# Patient Record
Sex: Male | Born: 1935 | Race: White | Hispanic: No | Marital: Married | Smoking: Never smoker
Health system: Southern US, Community
[De-identification: ages and names within clinical notes are randomized; demographics above are authoritative.]

## PROBLEM LIST (undated history)

## (undated) DIAGNOSIS — I10 Essential (primary) hypertension: Secondary | ICD-10-CM

## (undated) DIAGNOSIS — J309 Allergic rhinitis, unspecified: Secondary | ICD-10-CM

## (undated) DIAGNOSIS — R2689 Other abnormalities of gait and mobility: Secondary | ICD-10-CM

## (undated) DIAGNOSIS — I493 Ventricular premature depolarization: Secondary | ICD-10-CM

## (undated) DIAGNOSIS — E119 Type 2 diabetes mellitus without complications: Secondary | ICD-10-CM

## (undated) DIAGNOSIS — N400 Enlarged prostate without lower urinary tract symptoms: Secondary | ICD-10-CM

## (undated) DIAGNOSIS — R601 Generalized edema: Secondary | ICD-10-CM

## (undated) DIAGNOSIS — E7800 Pure hypercholesterolemia, unspecified: Secondary | ICD-10-CM

## (undated) DIAGNOSIS — H9190 Unspecified hearing loss, unspecified ear: Secondary | ICD-10-CM

## (undated) HISTORY — DX: Ventricular premature depolarization: I49.3

## (undated) HISTORY — DX: Generalized edema: R60.1

## (undated) HISTORY — PX: TONSILLECTOMY: SUR1361

## (undated) HISTORY — DX: Type 2 diabetes mellitus without complications: E11.9

## (undated) HISTORY — DX: Other abnormalities of gait and mobility: R26.89

## (undated) HISTORY — DX: Unspecified hearing loss, unspecified ear: H91.90

## (undated) HISTORY — DX: Essential (primary) hypertension: I10

## (undated) HISTORY — DX: Pure hypercholesterolemia, unspecified: E78.00

## (undated) HISTORY — DX: Allergic rhinitis, unspecified: J30.9

## (undated) HISTORY — DX: Benign prostatic hyperplasia without lower urinary tract symptoms: N40.0

---

## 2003-05-03 ENCOUNTER — Ambulatory Visit (HOSPITAL_COMMUNITY): Admission: RE | Admit: 2003-05-03 | Discharge: 2003-05-03 | Payer: Self-pay | Admitting: Gastroenterology

## 2003-05-03 ENCOUNTER — Encounter (INDEPENDENT_AMBULATORY_CARE_PROVIDER_SITE_OTHER): Payer: Self-pay | Admitting: Specialist

## 2003-08-10 ENCOUNTER — Ambulatory Visit (HOSPITAL_COMMUNITY): Admission: RE | Admit: 2003-08-10 | Discharge: 2003-08-10 | Payer: Self-pay | Admitting: General Surgery

## 2003-08-10 ENCOUNTER — Encounter (INDEPENDENT_AMBULATORY_CARE_PROVIDER_SITE_OTHER): Payer: Self-pay | Admitting: Specialist

## 2007-04-22 HISTORY — PX: CHOLECYSTECTOMY: SHX55

## 2008-03-12 ENCOUNTER — Inpatient Hospital Stay (HOSPITAL_COMMUNITY): Admission: EM | Admit: 2008-03-12 | Discharge: 2008-03-17 | Payer: Self-pay | Admitting: Emergency Medicine

## 2008-03-15 ENCOUNTER — Encounter (INDEPENDENT_AMBULATORY_CARE_PROVIDER_SITE_OTHER): Payer: Self-pay | Admitting: General Surgery

## 2009-10-01 IMAGING — CT CT PELVIS W/ CM
2 of 5 series · 16 of 46 positions shown, 18 images · IV contrast (APPLIED)
Comparison: No prior abdominal CT.  There is correlation with CT of
the chest 03/12/2008

CT ABDOMEN

CLINICAL DATA: Chest and abdominal pain

CT ABDOMEN AND PELVIS WITH CONTRAST
TECHNIQUE: Multidetector CT imaging of the abdomen and pelvis was
performed using the standard protocol following bolus
administration of intravenous contrast.
Contrast: 100 ml [id number]

[Series 2: abd/pelv with 5.0 b31f st · axial · 0.72mm/px · z∈[-486,-76]mm · 13 of 92 slices shown, 15 images]
[im 5/92  soft-tissue]
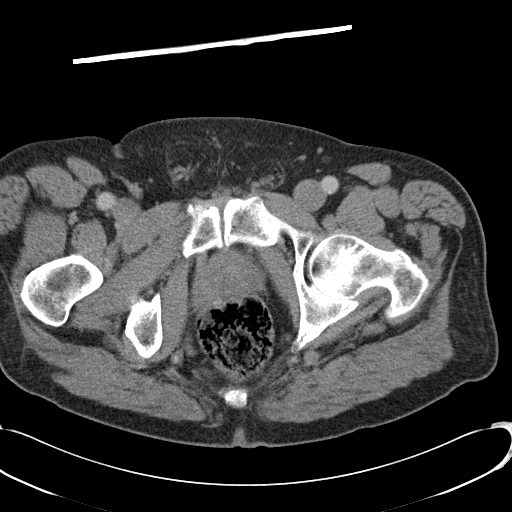
[im 5/92  bone]
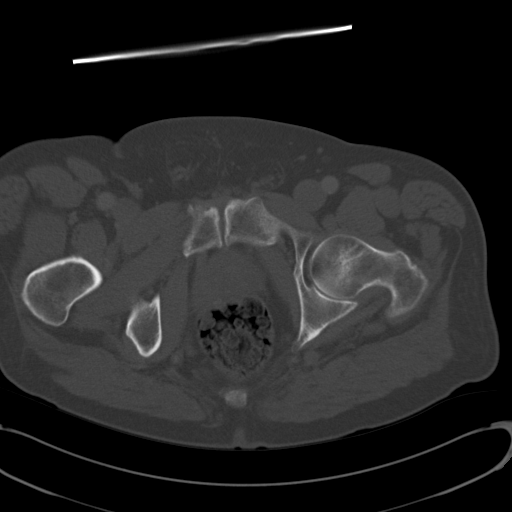
[im 14/92  soft-tissue]
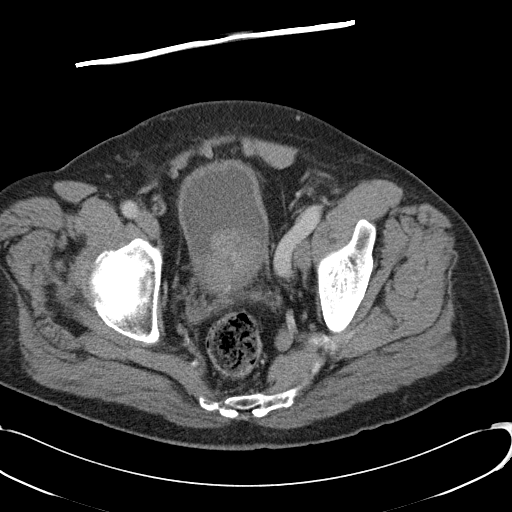
[im 18/92  soft-tissue]
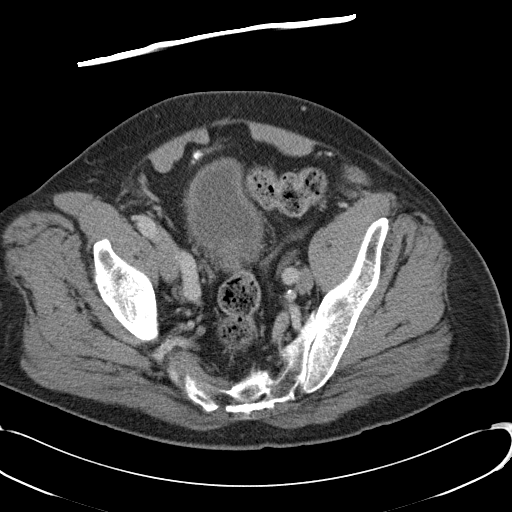
[im 27/92  soft-tissue]
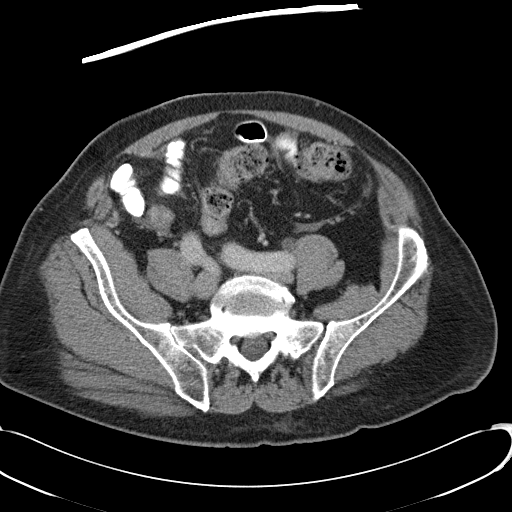
[im 31/92  soft-tissue]
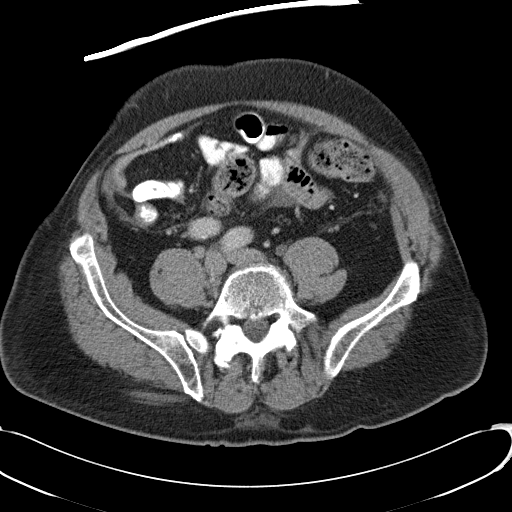
[im 40/92  soft-tissue]
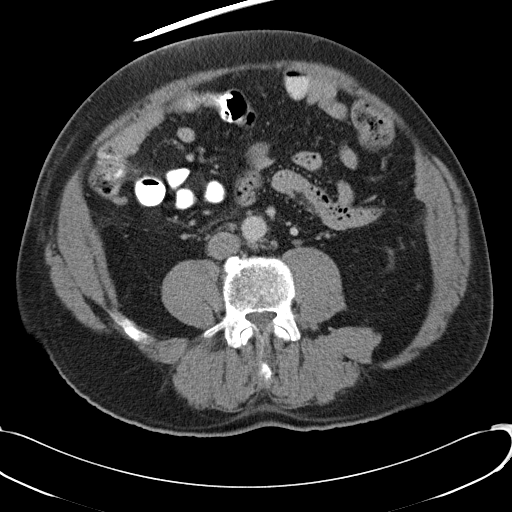
[im 48/92  soft-tissue]
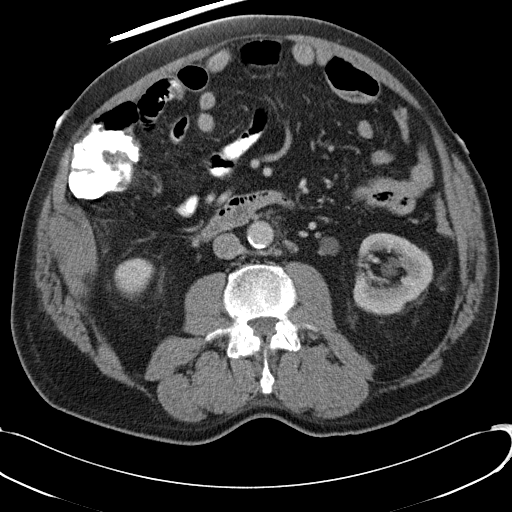
[im 53/92  soft-tissue]
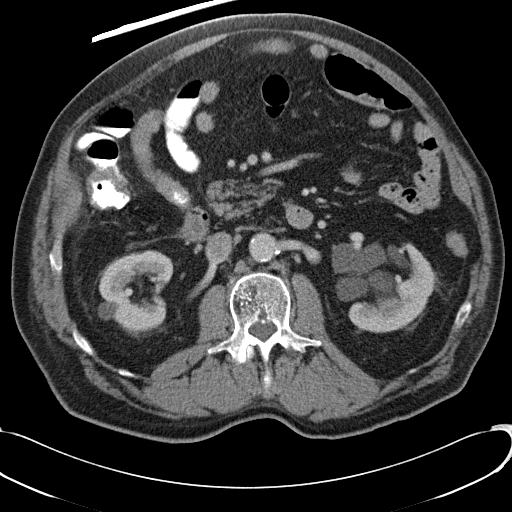
[im 61/92  soft-tissue]
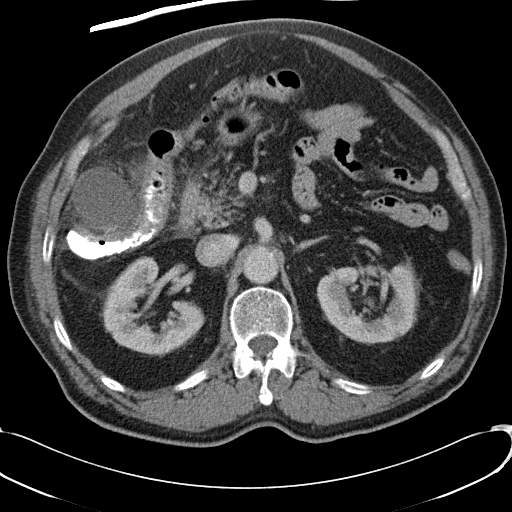
[im 61/92  bone]
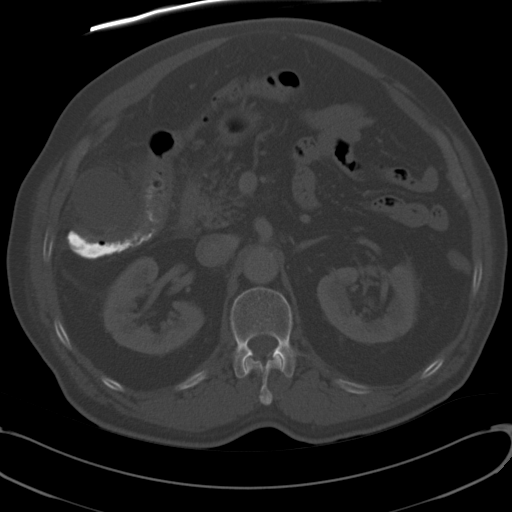
[im 66/92  soft-tissue]
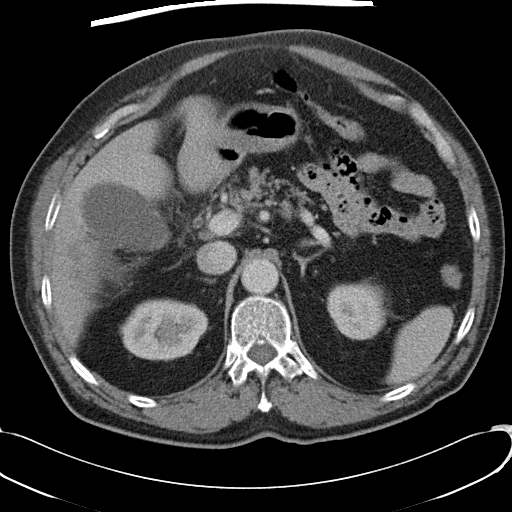
[im 74/92  soft-tissue]
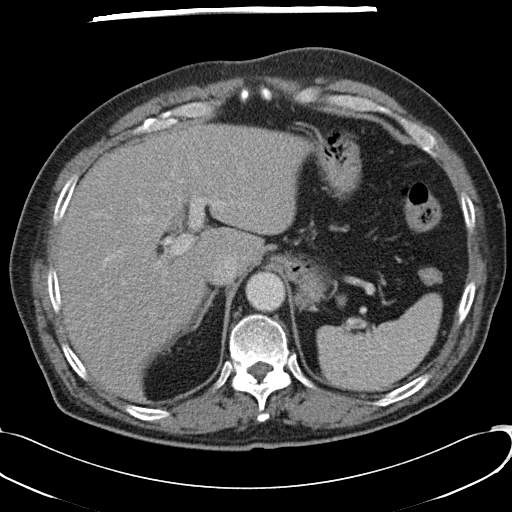
[im 79/92  soft-tissue]
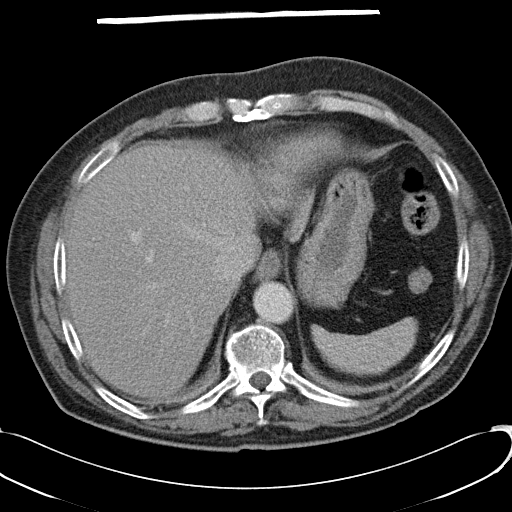
[im 87/92  soft-tissue]
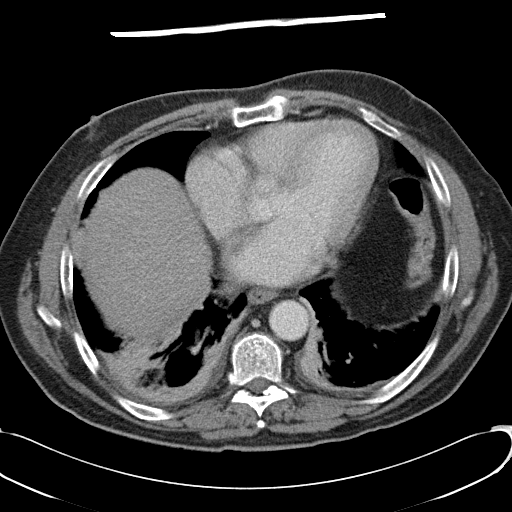

[Series 4: abd/pelv with 2.0 spo cor st · coronal · 0.89mm/px · 3 of 137 slices shown]
[im 46/137  soft-tissue]
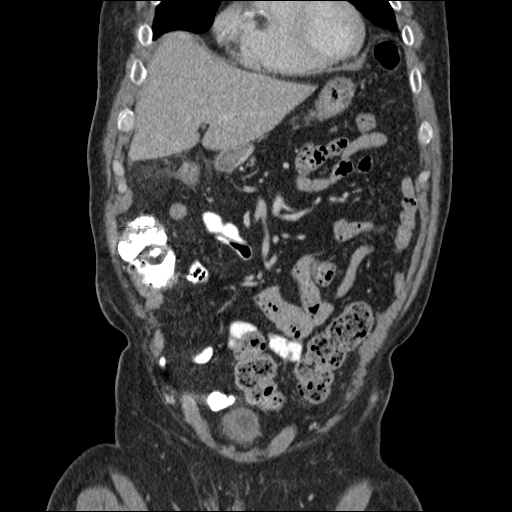
[im 61/137  soft-tissue]
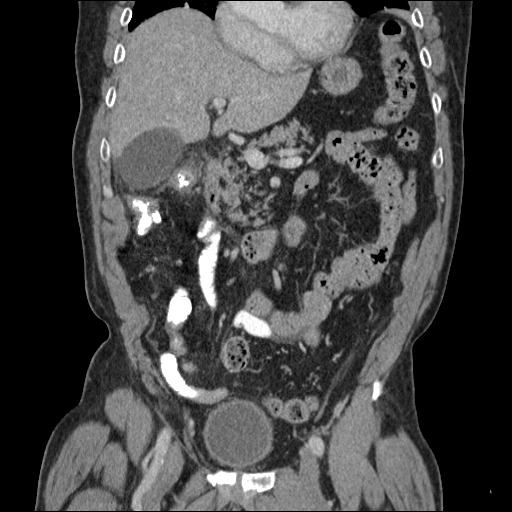
[im 76/137  soft-tissue]
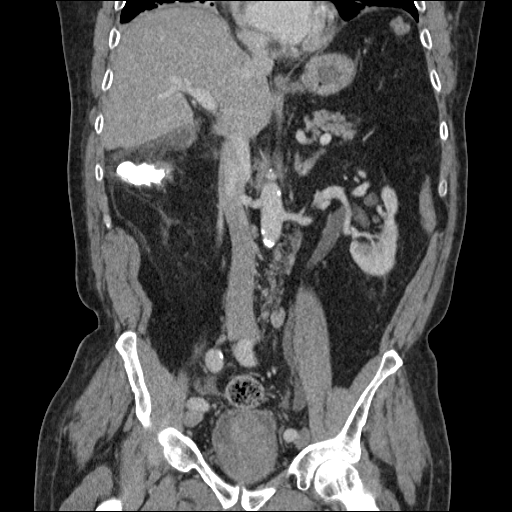

[16 of 46 positions shown; findings below may reference images not displayed]

FINDINGS: Highest cuts include the lung bases.  There is bilateral
lower lobe subsegmental atelectasis or atelectatic pneumonia with
small pleural effusions.  This is a new finding.

There is diffuse low attenuation of the liver without focal
lesions.  This is consistent with steatosis.  The spleen, pancreas,
and adrenal glands within normal limits.  There are small
calcifications in the dependent portion of the gallbladder, which
either represent small gallstones, or gallbladder wall
calcification.  This was noted on yesterday's CT of the chest.
Early and delayed images of the kidneys show multiple cysts and an
extra - renal pelvis on the left.  No obstruction.

The most striking abnormality is that there is an intense
inflammatory process in the fat surrounding the gallbladder and the
right colon, adjacent to the gallbladder.  This was not present on
yesterday's CT of the chest done for PE.  There is no definite
thickening or enhancement of the gallbladder wall to suggest acute
cholecystitis.  However, the gallbladder is mildly distended and
there are either small gallstones or calcifications of the
gallbladder wall.  There may be some thickening of the colon wall
adjacent to the gallbladder, but this could be secondary to
cholecystitis. On yesterday's the study, the right colon did not
appear thickened and so I think the inflammatory process is more
likely related to the gallbladder as opposed to the right colon. A
nuclear HIDA study may be of value to assess patency of the cystic
duct.  These findings were reviewed with Dr. Nana Ato of
gastroenterology.
IMPRESSION: 1.  Bilateral lower lobe subsegmental atelectasis or atelectatic
pneumonia with small effusions - new since CT scan of the chest
done yesterday.
2.  Marked inflammatory changes around the gallbladder and right
colon - new finding since yesterday's CT.  Suspect gallbladder as
the etiology, but cannot rule out colitis.  See above discussion.
There are either small gallstones or gallbladder wall
calcification.
3.  No other acute findings.

CT PELVIS
FINDINGS: There is marked enlargement of the prostate.  No other
focal masses.  No adenopathy or fluid collections.  Bony pelvis
intact.
IMPRESSION: Marked enlargement of the prostate - otherwise unremarkable.

## 2010-09-03 NOTE — Discharge Summary (Signed)
NAME:  Francisco Leon, MAN NO.:  [id number]   MEDICAL RECORD NO.:  [id number]          PATIENT TYPE:  INP   LOCATION:  4708                         FACILITY:  [facility]   PHYSICIAN:  Hollice Espy, M.D.DATE OF BIRTH:  08-12-1931   DATE OF ADMISSION:  03/11/2008  DATE OF DISCHARGE:  03/16/2008                               DISCHARGE SUMMARY   PRIMARY CARE PHYSICIAN:  L. Lupe Carney, MD.   CONSULTANTS DURING THIS CASE:  1. Cherylynn Ridges, MD, [facility]  2. Graylin Shiver, MD, [facility].   DISCHARGE DIAGNOSES:  1. Acute cholecystitis with cholelithiasis.  2. Status post laparoscopic cholecystectomy.  3. Pleural effusion.  4. Diabetes mellitus.  5. Hypertension.  6. Hyperlipidemia.  7. Hypokalemia, now resolved.   DISCHARGE MEDICATIONS:  The patient will resume all his previous  medications.  These are as follows,  1. Lipitor 10.  2. Indapamide 1.25.  3. Lisinopril 40 nightly.  4. Metformin 500 b.i.d.  5. Claritin 10 p.o. daily.  6. Flonase nasal spray nightly.   NEW MEDICATIONS:  Vicodin 5/500 p.o. q.6 h. p.r.n. for pain.   DISCHARGE DIET:  Low-sodium diabetic diet.   ACTIVITY:  Increase activity slowly.   FOLLOWUP APPOINTMENTS:  The patient will follow up with Dr. Lindie Spruce in 2  weeks and Dr. Lupe Carney in 2-3 weeks.  He will call for appointments  for both.   DISPOSITION:  Improved.   HOSPITAL COURSE:  The patient is a 77 year old white male with past  medical history of hypertension, diabetes mellitus, and hyperlipidemia  who presented complaining of right-sided chest as well as abdominal  pain.  Initially, CT scan of the chest was unremarkable for PE.  Cardiac  markers were negative.  It was suspected more this was secondary to an  abdominal process.  A abdominal CT noted gallstones versus gallbladder  wall calcification and marked inflammatory changes.  GI was consulted  who suspect this was acute cholecystitis.   HIDA was ordered and Surgery  was consulted.  The patient's HIDA scan result showed patency of the  gallbladder duct, however, given the fact that when the patient had CT  of the abdomen and pelvis, it showed some bilateral lower lobe  subsegmental atelectasis and small effusion, these were new since his  chest CT and felt that this was reactionary secondary to an inflamed  gallbladder.  The patient was evaluated by Surgery, felt to be medically  stable and was taken to the OR on March 15, 2008, and necrotic  gallbladder was removed and sent for pathology, which is pending.  Postop, the patient was doing well.  His diet was advanced and by  March 16, 2008, he was able to tolerate solid food.  He maintain  __________.  He was able to ambulate well.  Initially, he was reported  to have some decreased O2 sats, but when ambulated in the hall on room  air, it briefly dropped down to 89% on room air, but then __________up  nicely to 94% on room air, ambulating comfortably without any shortness  of breath or pain.  He was noted initially after his Foley was removed  after surgery to have some urinary retention which improved after his  Foley was reinserted.  This was on the early morning hours of March 16, 2008.  The Foley was then again removed on March 16, 2008, at  noon.  The patient has a reported history of some prostate problems, but  he is to yet to follow up with the urologist.  It is suspected likely  that with his initial Foley catheter placement presurgery, he likely had  some bladder spasm.  Currently, he is able to void somewhat, although he  has a slow to start urinary hesitancy.  As long as the patient is able  to avoid and is able to tolerate solid food p.o. by March 16, 2008,  he will be discharged to home with plans on following up with the  urologist which he will do so.  The patient was seen by Surgery and felt  to be medically stable for discharge from their  standpoint.      Hollice Espy, M.D.  Electronically Signed     SKK/MEDQ  D:  03/16/2008  T:  03/17/2008  Job:  [id number]   cc:   Cherylynn Ridges, M.D.  Graylin Shiver, M.D.  Elsworth Soho, M.D.

## 2010-09-03 NOTE — Op Note (Signed)
NAME:  Francisco Leon, DIFIORE NO.:  [id number]   MEDICAL RECORD NO.:  [id number]          PATIENT TYPE:  INP   LOCATION:  4708                         FACILITY:  [facility]   PHYSICIAN:  Cherylynn Ridges, M.D.    DATE OF BIRTH:  04-01-1932   DATE OF PROCEDURE:  03/15/2008  DATE OF DISCHARGE:                               OPERATIVE REPORT   PREOPERATIVE DIAGNOSIS:  Acute right upper quadrant inflammatory process  possible cholecystitis.   POSTOPERATIVE DIAGNOSIS:  Cholelithiasis with acute necrotic gangrenous  acute cholecystitis.   PROCEDURE:  Laparoscopic cholecystectomy with cholangiogram.   SURGEON:  Cherylynn Ridges, MD   ANESTHESIA:  General endotracheal.   ESTIMATED BLOOD LOSS:  Less than 50 mL.   COMPLICATIONS:  None.   CONDITION:  Stable.   FINDINGS:  The patient had a necrotic gallbladder with pigmented stones.  Normal intraoperative cholangiogram.   INDICATIONS FOR OPERATION:  The patient is a 77 year old male who is  admitted with abdominal pain.  CT scan demonstrating a right upper  quadrant inflammatory process, but he had a negative HIDA scan who now  comes in for laparoscopic investigation.   OPERATION:  The patient was taken to the operating room and placed on  the table in supine position.  After an adequate general endotracheal  anesthetic was administered, he was prepped and draped in the usual  sterile manner exposing the midline in the right upper quadrant.   A supraumbilical midline incision was made about 1-1.5 cm long using #15  blade and taken down to the midline fascia.  We grabbed the midline  fascia with Kocher clamps, then incised between the clamps using the 15  blade into the preperitoneal space.  As we tented up on the  supraumbilical fascia, we bluntly dissected down into the peritoneal  space with the Kelly clamp.  Once this was done, a pursestring suture of  0 Vicryl was passed around the fascial opening which secured in a Hasson  cannula which is subsequently passed.  Through the Hasson cannula we  insufflated carbon dioxide gas up to a maximum intra-abdominal pressure  of 15 mmHg.   Once the cannula was in place and the abdomen was distended with gas, we  placed a laparoscope with attached camera and light source, and saw that  there was an inflammatory process over the gallbladder itself.  We were  able to mobilize the omentum off that area to ensure that there was a  necrotic and acutely inflamed gallbladder in place.   When this was noted, two 5-mm right subcostal cannulas were passed and a  subxiphoid 11-12 mm cannula passed under direct vision.  Once all  cannulas were in place, we placed the patient in reverse Trendelenburg  and left side was tilted down and we started the dissection.   We were able to grab the gallbladder and retracted towards the anterior  abdominal wall in the right upper quadrant.  The second grasper was  passed along the infundibulum of the gallbladder long as to slay out the  peritoneum overlying the triangle of Calot and hepatoduodenal triangle.  We dissected the duodenal peritoneum exposing the cystic duct and the  cystic artery.  The cystic artery was clipped proximally x2 and then  distally x1 and transected.  The cystic duct was clipped along the  gallbladder side.  Then a cholecystodochotomy was made using  laparoscopic scissors through which [facility] catheter, which had been passed  through the anterior abdominal wall was passed in order to perform a  cholangiogram.  Cholangiogram showed good flow into the duodenum, no  ductal dilatation, no intraductal filling defects, and good proximal  filling.   Once the cholangiogram was completed, we removed the clips securing the  catheter in place, and then removed the catheter and we distally clipped  the cystic duct x3 and transected the cystic duct.  We then dissected  out the gallbladder from its bed with minimal difficulty.   Although the  bed was necrotic, we were able to remove with minimal bleeding,  cauterizing at the end in order to control hemostasis.   The gallbladder was removed from the supraumbilical fascia by using an  EndoCatch bag.  Once this is done, we were able to close out the  supraumbilical fascia site using a pursestring suture, which was in  place.  We inspected the bed, obtained hemostasis, and irrigated with  about a liter and half of saline solution.  Then aspirated all fluid and  gas from above the liver and removed all cannulas.   A 0.25% Marcaine with epinephrine was injected at all sites.  We then  closed the subxiphoid and supraumbilical skin sites using a running  subcuticular stitch with 4-0 Monocryl.  Dermabond along with Steri-  Strips and Tegaderms were applied to all incisions and covered.  All  counts were correct including needles, sponges, and instruments.      Cherylynn Ridges, M.D.  Electronically Signed     JOW/MEDQ  D:  03/15/2008  T:  03/15/2008  Job:  [id number]   cc:   [facility]

## 2010-09-03 NOTE — Consult Note (Signed)
NAME:  Francisco Leon, Francisco Leon NO.:  [id number]   MEDICAL RECORD NO.:  [id number]          PATIENT TYPE:  INP   LOCATION:  4708                         FACILITY:  [facility]   PHYSICIAN:  Cherylynn Ridges, M.D.    DATE OF BIRTH:  03/19/1932   DATE OF CONSULTATION:  03/13/2008  DATE OF DISCHARGE:                                 CONSULTATION   TIME OF CONSULTATION:  15:30 p.m.   REQUESTING PHYSICIAN:  Graylin Shiver, MD.   PRIMARY CARE PHYSICIAN:  [facility], may be a Dr. Clovis Riley.   CONSULTING SURGEON:  Cherylynn Ridges, M.D.   REASON FOR CONSULTATION:  Questionable cholecystitis.   HISTORY OF PRESENT ILLNESS:  Francisco Leon is a 77 year old white male  with a history of hypertension, diabetes, and hyperlipidemia for which  most of the history I have obtained has been from the chart as most of  my consult was done while the patient was having a HIDA scan done.  Apparently, the patient presented to the Emergency Department on  March 11, 2008, with an acute onset of chest and epigastric pain with  some associated nausea and a small amount of emesis.  Apparently, during  his acute onset of pain, he took a Tums, which made him feel worse.  He  has not had any diarrhea and his last bowel movement was yesterday  morning.   Once at the emergency department, the patient was found to have a white  blood cell count of 13,000; total bilirubin of 1.5, which is now 2.2;  indirect bilirubin is 1.3; and direct bilirubin is 0.2.  Alkaline  phosphatase 44, AST and ALT at 20.  At this time, they ruled out a  myocardial infarction as well as a pulmonary embolus.  Once these were  ruled out, an abdominal ultrasound was obtained, which did not show any  gallstones or biliary ductal diltation.  At this time, the patient was  admitted and on the following day, a CT scan of the abdomen and pelvis  was done, which showed marked inflammatory changes around the  gallbladder as well as the right  colon, in which colitis could not be  ruled out.  Otherwise, small gallstones versus gallbladder wall  calcifications are seen.  Today, the patient's total bilirubin is 2.2  and a white count of 12.9.  To further work this patient up, GI was  consulted and they ordered a HIDA scan, which was normal.  At this time,  we were consulted to see the patient for a possible surgical  intervention.   REVIEW OF SYSTEMS:  See HPI, otherwise all other systems are  unremarkable.   FAMILY HISTORY:  Noncontributory.   PAST MEDICAL HISTORY:  Significant for:  1. Hypertension.  2. Diabetes mellitus.  3. Hyperlipidemia.   PAST SURGICAL HISTORY:  1. Bilateral cataract surgeries in the past 2 months.  2. Hemorrhoidectomy.   SOCIAL HISTORY:  The patient is married.  He does not smoke, drink, or  use any illegal drugs.   ALLERGIES:  To SULFA.   MEDICATIONS:  1. Lisinopril 40 mg a  day.  2. Lipitor 10 mg a day.  3. Indapamide 1.25 mg a day.  4. Metformin 500 mg a day.  5. Claritin nightly.   PHYSICAL EXAMINATION:  GENERAL:  Francisco Leon is a 77 year old white male  who is well developed and well nourished.  Currently, lying in bed and  in no acute distress.  VITAL SIGNS:  Temperature 98.4, pulse 80, respirations 20, and blood  pressure 178/94.  HEENT:  Head is normocephalic and atraumatic.  Sclerae noninjected.  Pupils are equal, round, and reactive to light.  Ears and nose are  without any masses or lesions.  No rhinorrhea.  Mouth is pink and moist.  Throat shows no exudate.  NECK:  Supple.  Trachea is midline.  No thyromegaly.  HEART:  Regular rate and rhythm.  No murmurs, gallops, or rubs are  noted.  Carotid, radial, and pedal pulses 2+ bilaterally.  LUNGS:  Clear to auscultation bilaterally without any wheezes, rhonchi,  or rales.  Respiratory effort is nonlabored.  ABDOMEN:  Soft and obese without any bowel sounds.  He is nondistended  and currently is tender in the right upper quadrant  as well as the right  lower quadrant with some mild guarding in both of these areas.  He does  have a positive Murphy sign.  Otherwise, no obvious masses or hernias  are noted.  MUSCULOSKELETAL:  All four extremities are symmetrical with no cyanosis,  clubbing, or edema.  SKIN:  Warm and dry.  NEUROLOGICAL:  Cranial nerves II through XII appear to be grossly  intact.  PSYCHIATRIC:  The patient is alert and oriented x3 with an appropriate  affect.   LABORATORY DATA AND DIAGNOSTICS:  White blood cell count 12,900,  hemoglobin 12.4, hematocrit 37.1, and platelet count is 211,000.  Sodium  140, potassium 3.3, glucose 155, BUN 10, creatinine 0.73, total  bilirubin 2.2, alkaline phosphatase 44, AST and ALT are 20, and lipase  is normal.  Abdominal ultrasound shows no gallstones and no common bile  duct dilatation.  CT scan shows marked inflammatory changes of the  gallbladder as well as the right colon for which colitis could not be  ruled out, and it appears that some of the gallbladder wall is somewhat  calcified.  HIDA scan is normal.   IMPRESSION:  1. Abdominal pain, questionable gallbladder etiology.  2. Hypertension.  3. Diabetes mellitus.  4. Hyperlipidemia.   PLAN:  At this time, it appears that the patient may be having some  abdominal pain secondary to his gallbladder.  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  However, at this time, we cannot completely rule out that  his pain may be coming from his colon.  Therefore, at this time, we will  place the patient on Zosyn, allow him to have clear liquids tonight and  then make him n.p.o. after midnight.   At this time, we will continue to follow the patient and decide later  whether he may need his gallbladder out or not.  If he does need his  gallbladder out, then his gallbladder is a porcelain gallbladder, it may  be very difficult to remove  laparoscopically and may need an open  cholecystectomy.  However, like I said earlier, we will determine this  at a later date.  Otherwise, we will continue to follow along with you.   Thank you for this consult.  The patient has been seen and examined  by  Dr. Lindie Spruce as well.      Letha Cape, PA      Cherylynn Ridges, M.D.  Electronically Signed    KEO/MEDQ  D:  03/13/2008  T:  03/14/2008  Job:  [id number]   cc:   Graylin Shiver, M.D.  Dr. Janee Morn

## 2010-09-03 NOTE — H&P (Signed)
NAME:  Francisco Leon, Francisco Leon              ACCOUNT NO.:  [id number]   MEDICAL RECORD NO.:  [id number]          PATIENT TYPE:  INP   LOCATION:  1824                         FACILITY:  [facility]   PHYSICIAN:  Corinna L. Lendell Caprice, MDDATE OF BIRTH:  01/18/1932   DATE OF ADMISSION:  03/11/2008  DATE OF DISCHARGE:                              HISTORY & PHYSICAL   CHIEF COMPLAINT:  Chest pain and abdominal pain.   HISTORY OF PRESENT ILLNESS:  Francisco Leon is a pleasant 77 year old white  male with a history of hypertension, diabetes, and hyperlipidemia, who  presents with sudden onset of chest and epigastric pain while at the  computer tonight.  He had some associated nausea.  It felt like  indigestion.  He took some Tums, and he felt worse.  His wife called a  friend who was a nurse who recommended that he come to the emergency  room.  He has received nitroglycerin, GI cocktail, Zofran, aspirin,  Protonix, and Reglan.  He has some relief of his upper pain, but now  complains of diffuse abdominal pain.  He feels a little bloated.  He has  been belching.  He has received no pain medication in the emergency  room.  He has had a single episode of scant emesis without any blood.  He has had no diarrhea.  He has had no shortness of breath or  palpitations.  He ate a chicken kebab with salad earlier this evening at  a [employer].  He has no fevers or chills.  No dysuria.  No cough.   PAST MEDICAL HISTORY:  1. Diabetes.  2. Hypertension.  3. Hyperlipidemia.   MEDICATIONS:  1. Lisinopril 40 mg a day.  2. Lipitor 10 mg a day.  3. Indapamide 1.25 mg a day.  4. Metformin 500 mg a day.  5. Claritin nightly.   ALLERGIES:  SULFA.   SOCIAL HISTORY:  He does not smoke, drink, or use drugs.  He is married.   FAMILY HISTORY:  His mother had died of a hemorrhagic stroke.  He has 2  uncles who died in their 67s of heart attack.   PAST SURGICAL HISTORY:  1. Bilateral cataract surgery within the past 2  months.  2. Hemorrhoidectomy.   REVIEW OF SYSTEMS:  As above, otherwise negative.   PHYSICAL EXAMINATION:  VITAL SIGNS:  Temperature is 97.2, blood pressure  189/78, pulse 74, respiratory rate 16, and oxygen saturation 98% on room  air.  GENERAL:  The patient is well nourished and well developed, in no acute  distress.  His eyes were closed.  HEENT:  Normocephalic and atraumatic.  Pupils are equal, round, and  reactive to light.  Sclera nonicteric.  Moist mucous membranes.  NECK:  Supple.  No carotid bruits.  No lymphadenopathy.  No thyromegaly.  LUNGS:  Clear to auscultation bilaterally without wheezes, rhonchi, or  rales.  CARDIOVASCULAR:  Regular rate and rhythm without murmurs, gallops, or  rubs.  No chest wall tenderness.  GI:  He has somewhat hyperactive bowel sounds, soft, nontender, and  nondistended.  GU AND RECTAL:  Deferred.  EXTREMITIES:  No clubbing, cyanosis, or edema.  NEUROLOGIC:  The patient has his eyes closed, but is oriented and  following commands.  Cranial nerves and sensorimotor exam are intact.  SKIN:  No rash or jaundice.   LABORATORIES:  White blood cell count is 13,000, otherwise normal CBC.  D-dimer 0.58.  Basic metabolic panel significant for potassium of 3.4  and glucose of 188.  Two sets of point-of-care enzymes are negative.  Liver function tests and lipase are pending.  EKG shows normal sinus  rhythm.  One view of the chest shows nothing acute.  CT angiogram of the  chest shows no pulmonary embolus.  Lungs are clear.  Cholelithiasis  without CT evidence of cholecystitis.   ASSESSMENT AND PLAN:  1. Chest pain and abdominal pain:  This is rather uncharacteristic for      acute coronary syndrome, but the patient does have risk factors, I      am more concerned about an intra-abdominal problem.  I will check a      urinalysis, await liver function tests, lipase.  I will also check      a right upper quadrant ultrasound to further evaluate  gallbladder.      He will get antiemetics as needed, Protonix pain medication, and be      admitted to telemetry.  I will check another set of cardiac      enzymes.  Will also continue aspirin 81 mg a day.  2. Hypokalemia.  This will be repleted IV.  3. Hypertension.  Continue outpatient medications.  4. Diabetes.  Hold metformin for now and give sliding scale insulin.  5. Hyperlipidemia.  Hold on his Lipitor for now.  6. Leukocytosis.  He has no pulmonary infection, but I will check a      urinalysis, and await above studies.  I will also repeat this in      the morning.      Corinna L. Lendell Caprice, MD  Electronically Signed     CLS/MEDQ  D:  03/12/2008  T:  03/12/2008  Job:  [id number]   cc:   L. Lupe Carney, M.D.

## 2010-09-03 NOTE — Discharge Summary (Signed)
NAME:  Francisco Leon, Francisco Leon              ACCOUNT NO.:  [id number]   MEDICAL RECORD NO.:  [id number]          PATIENT TYPE:  INP   LOCATION:  4708                         FACILITY:  [facility]   PHYSICIAN:  Hollice Espy, M.D.DATE OF BIRTH:  08/22/1931   DATE OF ADMISSION:  03/12/2008  DATE OF DISCHARGE:  03/17/2008                               DISCHARGE SUMMARY   ADDENDUM   CONSULTANTS IN THIS CASE:  1. Cherylynn Ridges, MD, Surgery.  2. Graylin Shiver, MD, GI.   PRIMARY CARE PHYSICIAN:  L. Lupe Carney, MD    The patient was originally scheduled to be discharged on March 16, 2008, however, he started to have problems that morning with urinary  retention, feeling as if he had to avoid.  His Foley was discontinued  after surgery and initially he was voiding fine, but then looked to be  having problems complaining of bladder fullness and the need to void,  however, he was unable to have more than just a little output.  Bladder  scans revealed greater 70 mL of urine.  Foley was replaced and the  patient passed urine well.  Attempts were made then to discontinue his  Foley again on March 16, 2008, after 12 noon, however, again he then  had similar problems where he was unable to pass more than a small  amount of urine despite the bladder scan showing a large amount of urine  in his bladder.  Foley was replaced and the patient was able to pass  urine comfortably.  He then stayed overnight on March 16, 2008.  I  spoke with Dr. Patsi Sears of [facility] who recommended keeping  the patient's Foley catheter in and discharging him home.  The plan will  be for the patient to follow up with Urology and call the office on  Monday, March 20, 2008, and make an appointment to be seen shortly.  Dr. Patsi Sears also recommend the patient be placed on an antibiotic,  initially who recommended trimethoprim, however, the patient does have  an allergy and we placed on Cipro 500 b.i.d. x3  days in case of a mild  infection which might be causing some of his urinary retention issues,  inflaming his prostate.  The patient is otherwise doing well and he will  be discharged to home.   His additional medication other than the previous medicines from his  other discharge summary will now include Cipro 500 p.o. b.i.d. x3 days.      Hollice Espy, M.D.  Electronically Signed    SKK/MEDQ  D:  03/17/2008  T:  03/17/2008  Job:  [id number]   cc:   Cherylynn Ridges, M.D.  Sigmund I. Patsi Sears, M.D.  Graylin Shiver, M.D.  Elsworth Soho, M.D.

## 2010-09-03 NOTE — Consult Note (Signed)
NAME:  Francisco Leon, Francisco Leon NO.:  [id number]   MEDICAL RECORD NO.:  [id number]          PATIENT TYPE:  INP   LOCATION:  4708                         FACILITY:  [facility]   PHYSICIAN:  Graylin Shiver, M.D.   DATE OF BIRTH:  Mar 19, 1932   DATE OF CONSULTATION:  03/13/2008  DATE OF DISCHARGE:                                 CONSULTATION   REASON FOR CONSULTATION:  The patient is a 77 year old male who came to  the emergency room because of complaints of chest pain and abdominal  pain.  He said that the initial symptom that he had was that he felt  like he had to belch.  He then developed a chest pressure discomfort and  came to the emergency room.  A myocardial infarction and pulmonary  embolus were ruled out.  The patient then had an abdominal ultrasound  done which showed no evidence of gallstones but there was a trace amount  of free fluid in the subhepatic space.  A CT scan done today shows  either small gallstones or gallbladder wall calcification.  Of note is  that when the patient had a CT angio, gallstones were mentioned.  On the  CT today, there is also noted to be a marked inflammatory response  around the gallbladder and affecting the right colon.  The patient has  no symptoms of diarrhea or rectal bleeding.  He gives no symptoms to  suspect colitis.   PAST HISTORY:  1. Diabetes.  2. Hypertension.  3. Hyperlipidemia.   MEDICATIONS PRIOR TO ADMISSION:  Lisinopril, Lipitor, indapamide,  metformin, and Claritin.   ALLERGIES:  SULFA.   SOCIAL HISTORY:  He does not smoke or drink alcohol.   FAMILY HISTORY:  Noncontributory to present problem.   PAST SURGICAL HISTORY:  Hemorrhoidectomy and cataracts.   REVIEW OF SYSTEMS:  Negative except for what was mentioned above.   PHYSICAL EXAMINATION:  VITAL SIGNS:  Stable.  He is afebrile.  GENERAL:  He does not appear in any acute distress.  He is nonicteric.  NECK:  Supple.  HEART:  Regular rhythm.  No murmurs,  gallops or rubs.  LUNGS:  Clear.  ABDOMEN:  Bowel sounds are present although somewhat diminished.  They  do sound normal.  The abdomen is soft and nontender to palpation but  when he does take a deep breath, it hurts in the right upper quadrant of  his abdomen.  There is no obvious hepatosplenomegaly or masses.  EXTREMITIES:  Without edema.   WBCs 12,900, hemoglobin 12.4, hematocrit 37.1.  Liver enzymes show a  total bilirubin of 2.2, but otherwise they are normal.   I went down and reviewed the CT scan and ultrasound today with the  radiologist.   IMPRESSION:  I suspect that this patient has acute cholecystitis.   PLAN:  I have called in a surgical consult and we will order a HIDA  scan.           ______________________________  Graylin Shiver, M.D.     SFG/MEDQ  D:  03/13/2008  T:  03/13/2008  Job:  [id number]  cc:   [facility]  L. Lupe Carney, M.D.

## 2010-09-06 NOTE — Op Note (Signed)
NAME:  Francisco Leon, Francisco Leon                        ACCOUNT NO.:  [id number]   MEDICAL RECORD NO.:  [id number]                   PATIENT TYPE:  AMB   LOCATION:  ENDO                                 [facility]ACILITY:  [facility]   PHYSICIAN:  Petra Kuba, M.D.                 DATE OF BIRTH:  1931/10/09   DATE OF PROCEDURE:  05/03/2003  DATE OF DISCHARGE:                                 OPERATIVE REPORT   PROCEDURE:  Colonoscopy and polypectomy.   INDICATIONS:  Patient with multiple anorectal complaints.  Sounds like  prolapse versus hemorrhoids.  Want to evaluate endoscopically.  The patient  due for colonic screening.  Consent was signed after risks, benefits,  methods, and options were thoroughly discussed in the office.   PREMEDICATIONS:  Demerol 60 mg, Versed 6 mg.   DESCRIPTION OF PROCEDURE:  Rectal inspection pertinent for small external  hemorrhoids.  Digital exam was negative.  Video pediatric adjustable  colonoscope was inserted and on both anorectal pull-through and retroflexion  some internal greater than external hemorrhoids were seen.  There was no  mass, lesion or obvious polyps.  Phot documentation was obtained.  We then  advanced to the cecum which required a rolling him on his back and some  abdominal pressure.  No obvious abnormality was seen on insertion.  The  cecum was identified by the appendiceal orifice and the ileocecal valve.  Prep was fairly adequate.  It did require lots of washing and suctioning for  probably adequate visualization.  On slow withdrawal through the colon, the  cecum and ascending were normal.  In the more proximal transverse, a small  to tiny polyp was seen and snared.  Unfortunately, the snare cut through the  polyp and this area was hot biopsied z1 without obvious bleeding or residual  polyp.  The scope was slowly withdrawn.  Three tiny sigmoid and rectal  polyps were seen and were all hot biopsied and put in a second container.  No other abnormalities  were seen.  We slowly withdrew back to the rectum.  Again thorough evaluation of the rectum with both retroflexion and anorectal  pull-through confirmed the above findings.  The scope was reinserted a short  ways up the left side of the colon.  Air was suctioned and the scope  removed.  The patient tolerated the procedure well.  There were no obvious  immediate complications.   ENDOSCOPIC DIAGNOSES:  1. Internal greater than external hemorrhoids.  2. Two rectal, one sigmoid and one transverse snared and hot biopsied; the     others hot biopsied, tiny to small.  3. Otherwise within normal limits to the cecum.   PLAN:  Await pathology to determine future colonic screening.  We will refer  him to [facility] for consideration of hemorrhoidectomy or any other workup  plan, possibly __________ banding just to see if that helps.  I would be  happy  to see back p.r.n.                                               Petra Kuba, M.D.    MEM/MEDQ  D:  05/03/2003  T:  05/03/2003  Job:  [id number]   cc:   L. Lupe Carney, M.D.  [address] [place]  [place] [address]  Fax: [phone]   Sheppard Plumber. Earlene Plater, M.D.  [address] [place]  [place] [address]  Fax: [phone]

## 2010-09-06 NOTE — Op Note (Signed)
NAME:  Francisco Leon, Francisco Leon                        ACCOUNT NO.:  [id number]   MEDICAL RECORD NO.:  [id number]                   PATIENT TYPE:  AMB   LOCATION:  DAY                                  FACILITY:  [facility]   PHYSICIAN:  Timothy E. Earlene Plater, M.D.              DATE OF BIRTH:  Mar 15, 1932   DATE OF PROCEDURE:  08/10/2003  DATE OF DISCHARGE:                                 OPERATIVE REPORT   PREOPERATIVE DIAGNOSES:  Hemorrhoids with prolapse.   POSTOPERATIVE DIAGNOSES:  Hemorrhoids with prolapse.   PROCEDURE:  PPH hemorrhoidectomy.   SURGEON:  Timothy E. Earlene Plater, M.D.   ANESTHESIA:  General.   Francisco Leon is 26, health for his age, stable hypertension and diabetes with  a long history of hemorrhoids with increasing prolapse.  After careful  discussion in regards to the pathology and the treatment options he wished  to proceed with a PPH hemorrhoidectomy.  He was seen, evaluated, identified  and the permit signed.   The patient was taken to the operating room, placed supine, LMA anesthesia  provided. He was placed in lithotomy and carefully position.  The perianal  area was prepped and draped in the usual fashion.  The only notable external  findings were prolapse of the right posterior internal hemorrhoidal complex.  The anus was injected around and about with Marcaine, epinephrine and Wydase  mixed 1:20 and massaged in well.  The anus was lubricated and gently dilated  and the operating anoscope placed. The entire anorectum was carefully  evaluated and then beginning in the posterior at 4 to 4 1/2 cm above the  dentate line, a pursestring suture of 2-0 Prolene was placed  circumferentially about the rectal mucosa. This was tested with snugging  down on the index finger. Then using the short anoscope for the PPH device,  this was placed, sutures were drawn through, the [facility] stapling device was  then introduced the head of which was placed distal to the pursestring  suture.  The  pursestring suture was then tied snuggly about the shaft of the  PPH.  The tails of the suture were brought out through the shaft of the PPH  stapling device, the device was then closed, held for 50 seconds, fired,  held for 50 seconds and the removed. The specimen was inspected and sent for  routine pathology.  Careful inspection of the wound was then carried out  again using the operating anoscope. Two small areas of bleeding were found  at the suture line, one in the right posterior, one in the left lateral.  Each of these were sutured with 4-0 Vicryl.  Five minutes was allowed to  elapse and then the area was reinspected. There was no bleeding, the suture  line was intact, the lumen was well opened.  The suture line varied from 1-2  cm above the dentate line. This was thought to be satisfactory. A Gelfoam  pack  wrapped with surgical was placed in the rectal canal and an external  dressing.  He had tolerated it well.  Counts were correct. He was extubated  and taken to the recovery room in good condition.   Written and verbal instructions were given to him and his wife including  Percocet 5 mg to use 1/2 to 1, #20.  He will be seen and followed in the  office.                                               Timothy E. Earlene Plater, M.D.    TED/MEDQ  D:  08/10/2003  T:  08/11/2003  Job:  [id number]   cc:   Petra Kuba, M.D.  [address]  [place]  [place] [address]  Fax: [phone]   L. Lupe Carney, M.D.  [address] [place]  [place] [address]  Fax: [phone]

## 2010-09-27 ENCOUNTER — Other Ambulatory Visit: Payer: Self-pay | Admitting: Gastroenterology

## 2011-01-07 ENCOUNTER — Other Ambulatory Visit: Payer: Self-pay | Admitting: Dermatology

## 2011-01-22 LAB — URINALYSIS, ROUTINE W REFLEX MICROSCOPIC
Bilirubin Urine: NEGATIVE
Glucose, UA: >1000 — AB
Protein, ur: NEGATIVE
pH: 6.5

## 2011-01-22 LAB — CBC
HCT: 37.1 — ABNORMAL LOW
HCT: 38.1 — ABNORMAL LOW
HCT: 39.7
Hemoglobin: 12.2 — ABNORMAL LOW
Hemoglobin: 12.4 — ABNORMAL LOW
Hemoglobin: 12.7 — ABNORMAL LOW
MCHC: 32.9
MCHC: 33.3
MCHC: 34.2
MCHC: 34.8
MCV: 85.9
MCV: 87.6
MCV: 87.8
Platelets: 300
RBC: 4.23
RBC: 4.34
RBC: 4.5
RBC: 4.61
RDW: 13.9
RDW: 14
RDW: 14.1
RDW: 14.2
RDW: 14.2
RDW: 14.3
WBC: 12.9 — ABNORMAL HIGH
WBC: 14.8 — ABNORMAL HIGH

## 2011-01-22 LAB — GLUCOSE, CAPILLARY
Glucose-Capillary: 140 — ABNORMAL HIGH
Glucose-Capillary: 143 — ABNORMAL HIGH
Glucose-Capillary: 148 — ABNORMAL HIGH
Glucose-Capillary: 152 — ABNORMAL HIGH
Glucose-Capillary: 153 — ABNORMAL HIGH
Glucose-Capillary: 154 — ABNORMAL HIGH
Glucose-Capillary: 155 — ABNORMAL HIGH
Glucose-Capillary: 176 — ABNORMAL HIGH
Glucose-Capillary: 178 — ABNORMAL HIGH
Glucose-Capillary: 193 — ABNORMAL HIGH
Glucose-Capillary: 197 — ABNORMAL HIGH
Glucose-Capillary: 204 — ABNORMAL HIGH
Glucose-Capillary: 205 — ABNORMAL HIGH

## 2011-01-22 LAB — COMPREHENSIVE METABOLIC PANEL
ALT: 37
ALT: 54 — ABNORMAL HIGH
ALT: 74 — ABNORMAL HIGH
AST: 47 — ABNORMAL HIGH
Albumin: 2.8 — ABNORMAL LOW
Alkaline Phosphatase: 44
Alkaline Phosphatase: 68
BUN: 10
BUN: 8
CO2: 25
CO2: 26
Calcium: 8.8
Calcium: 8.9
Calcium: 9.2
Chloride: 108
Creatinine, Ser: 0.87
Creatinine, Ser: 0.88
GFR calc Af Amer: >60
GFR calc Af Amer: >60
GFR calc non Af Amer: >60
GFR calc non Af Amer: >60
Glucose, Bld: 145 — ABNORMAL HIGH
Glucose, Bld: 149 — ABNORMAL HIGH
Glucose, Bld: 155 — ABNORMAL HIGH
Potassium: 2.9 — ABNORMAL LOW
Potassium: 3.3 — ABNORMAL LOW
Sodium: 136
Sodium: 139
Sodium: 140
Total Bilirubin: 2.2 — ABNORMAL HIGH
Total Protein: 5.5 — ABNORMAL LOW
Total Protein: 5.6 — ABNORMAL LOW
Total Protein: 5.9 — ABNORMAL LOW

## 2011-01-22 LAB — DIFFERENTIAL
Basophils Absolute: 0
Basophils Absolute: 0
Basophils Relative: 0
Basophils Relative: 0
Basophils Relative: 0
Eosinophils Absolute: 0.1
Eosinophils Absolute: 0.1
Eosinophils Absolute: 0.2
Eosinophils Absolute: 0.4
Eosinophils Relative: 0
Eosinophils Relative: 1
Lymphocytes Relative: 17
Lymphs Abs: 2
Lymphs Abs: 2.1
Monocytes Absolute: 1.6 — ABNORMAL HIGH
Monocytes Relative: 11
Monocytes Relative: 11
Monocytes Relative: 6
Neutro Abs: 6.2
Neutro Abs: 8.4 — ABNORMAL HIGH
Neutro Abs: 9.8 — ABNORMAL HIGH
Neutrophils Relative %: 63
Neutrophils Relative %: 65
Neutrophils Relative %: 70
Neutrophils Relative %: 75

## 2011-01-22 LAB — PROTIME-INR
INR: 1
Prothrombin Time: 13.6

## 2011-01-22 LAB — CARDIAC PANEL(CRET KIN+CKTOT+MB+TROPI)
CK, MB: 0.9
CK, MB: 1
CK, MB: 1.6
Total CK: 53
Troponin I: 0.01
Troponin I: 0.02

## 2011-01-22 LAB — POCT CARDIAC MARKERS
CKMB, poc: 1.3
Myoglobin, poc: 52.5
Myoglobin, poc: 63.5
Troponin i, poc: <0.05
Troponin i, poc: <0.05

## 2011-01-22 LAB — D-DIMER, QUANTITATIVE: D-Dimer, Quant: 0.58 — ABNORMAL HIGH

## 2011-01-22 LAB — POCT I-STAT, CHEM 8
BUN: 16
HCT: 40
Potassium: 3.4 — ABNORMAL LOW

## 2011-01-22 LAB — URINE MICROSCOPIC-ADD ON

## 2011-01-22 LAB — APTT: aPTT: 28

## 2011-01-22 LAB — LIPASE, BLOOD: Lipase: 16

## 2011-01-22 LAB — HEPATIC FUNCTION PANEL
Albumin: 3.6
Indirect Bilirubin: 1.3 — ABNORMAL HIGH
Total Protein: 5.8 — ABNORMAL LOW

## 2011-01-22 LAB — MAGNESIUM: Magnesium: 2.4

## 2011-02-18 ENCOUNTER — Other Ambulatory Visit: Payer: Self-pay | Admitting: Dermatology

## 2011-04-23 DIAGNOSIS — E119 Type 2 diabetes mellitus without complications: Secondary | ICD-10-CM | POA: Diagnosis not present

## 2011-04-23 DIAGNOSIS — E7800 Pure hypercholesterolemia, unspecified: Secondary | ICD-10-CM | POA: Diagnosis not present

## 2011-04-29 DIAGNOSIS — I10 Essential (primary) hypertension: Secondary | ICD-10-CM | POA: Diagnosis not present

## 2011-04-29 DIAGNOSIS — R609 Edema, unspecified: Secondary | ICD-10-CM | POA: Diagnosis not present

## 2011-04-29 DIAGNOSIS — E119 Type 2 diabetes mellitus without complications: Secondary | ICD-10-CM | POA: Diagnosis not present

## 2011-04-29 DIAGNOSIS — E7800 Pure hypercholesterolemia, unspecified: Secondary | ICD-10-CM | POA: Diagnosis not present

## 2011-04-29 DIAGNOSIS — J309 Allergic rhinitis, unspecified: Secondary | ICD-10-CM | POA: Diagnosis not present

## 2011-05-15 DIAGNOSIS — H1045 Other chronic allergic conjunctivitis: Secondary | ICD-10-CM | POA: Diagnosis not present

## 2011-05-15 DIAGNOSIS — H01009 Unspecified blepharitis unspecified eye, unspecified eyelid: Secondary | ICD-10-CM | POA: Diagnosis not present

## 2011-05-15 DIAGNOSIS — H4011X0 Primary open-angle glaucoma, stage unspecified: Secondary | ICD-10-CM | POA: Diagnosis not present

## 2011-05-15 DIAGNOSIS — H04129 Dry eye syndrome of unspecified lacrimal gland: Secondary | ICD-10-CM | POA: Diagnosis not present

## 2011-07-28 DIAGNOSIS — Z79899 Other long term (current) drug therapy: Secondary | ICD-10-CM | POA: Diagnosis not present

## 2011-07-29 DIAGNOSIS — H1045 Other chronic allergic conjunctivitis: Secondary | ICD-10-CM | POA: Diagnosis not present

## 2011-07-29 DIAGNOSIS — J309 Allergic rhinitis, unspecified: Secondary | ICD-10-CM | POA: Diagnosis not present

## 2011-08-05 DIAGNOSIS — J309 Allergic rhinitis, unspecified: Secondary | ICD-10-CM | POA: Diagnosis not present

## 2011-08-08 DIAGNOSIS — J309 Allergic rhinitis, unspecified: Secondary | ICD-10-CM | POA: Diagnosis not present

## 2011-08-11 DIAGNOSIS — J309 Allergic rhinitis, unspecified: Secondary | ICD-10-CM | POA: Diagnosis not present

## 2011-08-15 DIAGNOSIS — J309 Allergic rhinitis, unspecified: Secondary | ICD-10-CM | POA: Diagnosis not present

## 2011-08-18 DIAGNOSIS — J309 Allergic rhinitis, unspecified: Secondary | ICD-10-CM | POA: Diagnosis not present

## 2011-08-21 DIAGNOSIS — H1045 Other chronic allergic conjunctivitis: Secondary | ICD-10-CM | POA: Diagnosis not present

## 2011-08-21 DIAGNOSIS — H01009 Unspecified blepharitis unspecified eye, unspecified eyelid: Secondary | ICD-10-CM | POA: Diagnosis not present

## 2011-08-21 DIAGNOSIS — Z961 Presence of intraocular lens: Secondary | ICD-10-CM | POA: Diagnosis not present

## 2011-08-21 DIAGNOSIS — H40019 Open angle with borderline findings, low risk, unspecified eye: Secondary | ICD-10-CM | POA: Diagnosis not present

## 2011-08-21 DIAGNOSIS — D3130 Benign neoplasm of unspecified choroid: Secondary | ICD-10-CM | POA: Diagnosis not present

## 2011-08-22 DIAGNOSIS — J309 Allergic rhinitis, unspecified: Secondary | ICD-10-CM | POA: Diagnosis not present

## 2011-08-25 DIAGNOSIS — J309 Allergic rhinitis, unspecified: Secondary | ICD-10-CM | POA: Diagnosis not present

## 2011-08-29 DIAGNOSIS — J309 Allergic rhinitis, unspecified: Secondary | ICD-10-CM | POA: Diagnosis not present

## 2011-09-02 DIAGNOSIS — J309 Allergic rhinitis, unspecified: Secondary | ICD-10-CM | POA: Diagnosis not present

## 2011-09-04 DIAGNOSIS — J309 Allergic rhinitis, unspecified: Secondary | ICD-10-CM | POA: Diagnosis not present

## 2011-09-08 DIAGNOSIS — J309 Allergic rhinitis, unspecified: Secondary | ICD-10-CM | POA: Diagnosis not present

## 2011-09-12 DIAGNOSIS — J309 Allergic rhinitis, unspecified: Secondary | ICD-10-CM | POA: Diagnosis not present

## 2011-09-16 DIAGNOSIS — J309 Allergic rhinitis, unspecified: Secondary | ICD-10-CM | POA: Diagnosis not present

## 2011-09-19 DIAGNOSIS — J309 Allergic rhinitis, unspecified: Secondary | ICD-10-CM | POA: Diagnosis not present

## 2011-09-22 DIAGNOSIS — J309 Allergic rhinitis, unspecified: Secondary | ICD-10-CM | POA: Diagnosis not present

## 2011-09-26 DIAGNOSIS — J309 Allergic rhinitis, unspecified: Secondary | ICD-10-CM | POA: Diagnosis not present

## 2011-09-29 DIAGNOSIS — J309 Allergic rhinitis, unspecified: Secondary | ICD-10-CM | POA: Diagnosis not present

## 2011-10-03 DIAGNOSIS — J309 Allergic rhinitis, unspecified: Secondary | ICD-10-CM | POA: Diagnosis not present

## 2011-10-06 DIAGNOSIS — J309 Allergic rhinitis, unspecified: Secondary | ICD-10-CM | POA: Diagnosis not present

## 2011-10-09 DIAGNOSIS — J309 Allergic rhinitis, unspecified: Secondary | ICD-10-CM | POA: Diagnosis not present

## 2011-10-13 DIAGNOSIS — J309 Allergic rhinitis, unspecified: Secondary | ICD-10-CM | POA: Diagnosis not present

## 2011-10-20 DIAGNOSIS — J309 Allergic rhinitis, unspecified: Secondary | ICD-10-CM | POA: Diagnosis not present

## 2011-10-28 DIAGNOSIS — E119 Type 2 diabetes mellitus without complications: Secondary | ICD-10-CM | POA: Diagnosis not present

## 2011-10-28 DIAGNOSIS — E7800 Pure hypercholesterolemia, unspecified: Secondary | ICD-10-CM | POA: Diagnosis not present

## 2011-10-28 DIAGNOSIS — R609 Edema, unspecified: Secondary | ICD-10-CM | POA: Diagnosis not present

## 2011-10-28 DIAGNOSIS — I10 Essential (primary) hypertension: Secondary | ICD-10-CM | POA: Diagnosis not present

## 2011-10-29 DIAGNOSIS — J309 Allergic rhinitis, unspecified: Secondary | ICD-10-CM | POA: Diagnosis not present

## 2011-10-30 DIAGNOSIS — H903 Sensorineural hearing loss, bilateral: Secondary | ICD-10-CM | POA: Diagnosis not present

## 2011-10-30 DIAGNOSIS — H6120 Impacted cerumen, unspecified ear: Secondary | ICD-10-CM | POA: Diagnosis not present

## 2011-11-05 DIAGNOSIS — J309 Allergic rhinitis, unspecified: Secondary | ICD-10-CM | POA: Diagnosis not present

## 2011-11-06 DIAGNOSIS — J309 Allergic rhinitis, unspecified: Secondary | ICD-10-CM | POA: Diagnosis not present

## 2011-11-11 DIAGNOSIS — L821 Other seborrheic keratosis: Secondary | ICD-10-CM | POA: Diagnosis not present

## 2011-11-11 DIAGNOSIS — Z85828 Personal history of other malignant neoplasm of skin: Secondary | ICD-10-CM | POA: Diagnosis not present

## 2011-11-11 DIAGNOSIS — L570 Actinic keratosis: Secondary | ICD-10-CM | POA: Diagnosis not present

## 2011-11-14 DIAGNOSIS — J309 Allergic rhinitis, unspecified: Secondary | ICD-10-CM | POA: Diagnosis not present

## 2011-11-24 DIAGNOSIS — J309 Allergic rhinitis, unspecified: Secondary | ICD-10-CM | POA: Diagnosis not present

## 2011-11-25 DIAGNOSIS — N644 Mastodynia: Secondary | ICD-10-CM | POA: Diagnosis not present

## 2011-11-25 DIAGNOSIS — N400 Enlarged prostate without lower urinary tract symptoms: Secondary | ICD-10-CM | POA: Diagnosis not present

## 2011-12-02 DIAGNOSIS — J309 Allergic rhinitis, unspecified: Secondary | ICD-10-CM | POA: Diagnosis not present

## 2011-12-09 DIAGNOSIS — J309 Allergic rhinitis, unspecified: Secondary | ICD-10-CM | POA: Diagnosis not present

## 2011-12-16 DIAGNOSIS — J309 Allergic rhinitis, unspecified: Secondary | ICD-10-CM | POA: Diagnosis not present

## 2011-12-23 DIAGNOSIS — J309 Allergic rhinitis, unspecified: Secondary | ICD-10-CM | POA: Diagnosis not present

## 2011-12-24 DIAGNOSIS — J301 Allergic rhinitis due to pollen: Secondary | ICD-10-CM | POA: Diagnosis not present

## 2011-12-24 DIAGNOSIS — J3089 Other allergic rhinitis: Secondary | ICD-10-CM | POA: Diagnosis not present

## 2011-12-24 DIAGNOSIS — H1045 Other chronic allergic conjunctivitis: Secondary | ICD-10-CM | POA: Diagnosis not present

## 2011-12-25 DIAGNOSIS — J309 Allergic rhinitis, unspecified: Secondary | ICD-10-CM | POA: Diagnosis not present

## 2011-12-30 DIAGNOSIS — J309 Allergic rhinitis, unspecified: Secondary | ICD-10-CM | POA: Diagnosis not present

## 2012-01-01 DIAGNOSIS — J309 Allergic rhinitis, unspecified: Secondary | ICD-10-CM | POA: Diagnosis not present

## 2012-01-06 DIAGNOSIS — J309 Allergic rhinitis, unspecified: Secondary | ICD-10-CM | POA: Diagnosis not present

## 2012-01-13 DIAGNOSIS — J309 Allergic rhinitis, unspecified: Secondary | ICD-10-CM | POA: Diagnosis not present

## 2012-01-20 DIAGNOSIS — J309 Allergic rhinitis, unspecified: Secondary | ICD-10-CM | POA: Diagnosis not present

## 2012-01-27 DIAGNOSIS — J309 Allergic rhinitis, unspecified: Secondary | ICD-10-CM | POA: Diagnosis not present

## 2012-02-02 DIAGNOSIS — J309 Allergic rhinitis, unspecified: Secondary | ICD-10-CM | POA: Diagnosis not present

## 2012-02-10 DIAGNOSIS — J309 Allergic rhinitis, unspecified: Secondary | ICD-10-CM | POA: Diagnosis not present

## 2012-02-17 DIAGNOSIS — J309 Allergic rhinitis, unspecified: Secondary | ICD-10-CM | POA: Diagnosis not present

## 2012-02-24 DIAGNOSIS — Z23 Encounter for immunization: Secondary | ICD-10-CM | POA: Diagnosis not present

## 2012-02-24 DIAGNOSIS — J309 Allergic rhinitis, unspecified: Secondary | ICD-10-CM | POA: Diagnosis not present

## 2012-03-02 DIAGNOSIS — J309 Allergic rhinitis, unspecified: Secondary | ICD-10-CM | POA: Diagnosis not present

## 2012-03-09 DIAGNOSIS — J309 Allergic rhinitis, unspecified: Secondary | ICD-10-CM | POA: Diagnosis not present

## 2012-03-16 DIAGNOSIS — J309 Allergic rhinitis, unspecified: Secondary | ICD-10-CM | POA: Diagnosis not present

## 2012-03-23 DIAGNOSIS — J309 Allergic rhinitis, unspecified: Secondary | ICD-10-CM | POA: Diagnosis not present

## 2012-03-30 DIAGNOSIS — J309 Allergic rhinitis, unspecified: Secondary | ICD-10-CM | POA: Diagnosis not present

## 2012-04-06 DIAGNOSIS — J309 Allergic rhinitis, unspecified: Secondary | ICD-10-CM | POA: Diagnosis not present

## 2012-04-13 DIAGNOSIS — J309 Allergic rhinitis, unspecified: Secondary | ICD-10-CM | POA: Diagnosis not present

## 2012-04-20 DIAGNOSIS — J309 Allergic rhinitis, unspecified: Secondary | ICD-10-CM | POA: Diagnosis not present

## 2012-04-27 DIAGNOSIS — J309 Allergic rhinitis, unspecified: Secondary | ICD-10-CM | POA: Diagnosis not present

## 2012-04-28 DIAGNOSIS — J309 Allergic rhinitis, unspecified: Secondary | ICD-10-CM | POA: Diagnosis not present

## 2012-05-03 DIAGNOSIS — J309 Allergic rhinitis, unspecified: Secondary | ICD-10-CM | POA: Diagnosis not present

## 2012-05-05 DIAGNOSIS — E119 Type 2 diabetes mellitus without complications: Secondary | ICD-10-CM | POA: Diagnosis not present

## 2012-05-05 DIAGNOSIS — N400 Enlarged prostate without lower urinary tract symptoms: Secondary | ICD-10-CM | POA: Diagnosis not present

## 2012-05-05 DIAGNOSIS — E7800 Pure hypercholesterolemia, unspecified: Secondary | ICD-10-CM | POA: Diagnosis not present

## 2012-05-05 DIAGNOSIS — I10 Essential (primary) hypertension: Secondary | ICD-10-CM | POA: Diagnosis not present

## 2012-05-05 DIAGNOSIS — R609 Edema, unspecified: Secondary | ICD-10-CM | POA: Diagnosis not present

## 2012-05-12 DIAGNOSIS — J309 Allergic rhinitis, unspecified: Secondary | ICD-10-CM | POA: Diagnosis not present

## 2012-05-18 DIAGNOSIS — J309 Allergic rhinitis, unspecified: Secondary | ICD-10-CM | POA: Diagnosis not present

## 2012-05-21 DIAGNOSIS — J309 Allergic rhinitis, unspecified: Secondary | ICD-10-CM | POA: Diagnosis not present

## 2012-05-25 DIAGNOSIS — J309 Allergic rhinitis, unspecified: Secondary | ICD-10-CM | POA: Diagnosis not present

## 2012-05-27 DIAGNOSIS — J309 Allergic rhinitis, unspecified: Secondary | ICD-10-CM | POA: Diagnosis not present

## 2012-06-01 DIAGNOSIS — J309 Allergic rhinitis, unspecified: Secondary | ICD-10-CM | POA: Diagnosis not present

## 2012-06-08 DIAGNOSIS — J309 Allergic rhinitis, unspecified: Secondary | ICD-10-CM | POA: Diagnosis not present

## 2012-06-15 DIAGNOSIS — J309 Allergic rhinitis, unspecified: Secondary | ICD-10-CM | POA: Diagnosis not present

## 2012-06-22 DIAGNOSIS — J309 Allergic rhinitis, unspecified: Secondary | ICD-10-CM | POA: Diagnosis not present

## 2012-06-29 DIAGNOSIS — J309 Allergic rhinitis, unspecified: Secondary | ICD-10-CM | POA: Diagnosis not present

## 2012-07-07 DIAGNOSIS — J309 Allergic rhinitis, unspecified: Secondary | ICD-10-CM | POA: Diagnosis not present

## 2012-07-09 DIAGNOSIS — J301 Allergic rhinitis due to pollen: Secondary | ICD-10-CM | POA: Diagnosis not present

## 2012-07-09 DIAGNOSIS — J3089 Other allergic rhinitis: Secondary | ICD-10-CM | POA: Diagnosis not present

## 2012-07-09 DIAGNOSIS — H1045 Other chronic allergic conjunctivitis: Secondary | ICD-10-CM | POA: Diagnosis not present

## 2012-07-13 DIAGNOSIS — J309 Allergic rhinitis, unspecified: Secondary | ICD-10-CM | POA: Diagnosis not present

## 2012-07-20 DIAGNOSIS — J309 Allergic rhinitis, unspecified: Secondary | ICD-10-CM | POA: Diagnosis not present

## 2012-07-27 DIAGNOSIS — J309 Allergic rhinitis, unspecified: Secondary | ICD-10-CM | POA: Diagnosis not present

## 2012-08-03 DIAGNOSIS — J309 Allergic rhinitis, unspecified: Secondary | ICD-10-CM | POA: Diagnosis not present

## 2012-08-10 DIAGNOSIS — J309 Allergic rhinitis, unspecified: Secondary | ICD-10-CM | POA: Diagnosis not present

## 2012-08-17 DIAGNOSIS — J309 Allergic rhinitis, unspecified: Secondary | ICD-10-CM | POA: Diagnosis not present

## 2012-08-24 DIAGNOSIS — J309 Allergic rhinitis, unspecified: Secondary | ICD-10-CM | POA: Diagnosis not present

## 2012-08-31 DIAGNOSIS — J309 Allergic rhinitis, unspecified: Secondary | ICD-10-CM | POA: Diagnosis not present

## 2012-09-06 DIAGNOSIS — J309 Allergic rhinitis, unspecified: Secondary | ICD-10-CM | POA: Diagnosis not present

## 2012-09-07 DIAGNOSIS — J309 Allergic rhinitis, unspecified: Secondary | ICD-10-CM | POA: Diagnosis not present

## 2012-09-15 DIAGNOSIS — J309 Allergic rhinitis, unspecified: Secondary | ICD-10-CM | POA: Diagnosis not present

## 2012-09-21 DIAGNOSIS — J309 Allergic rhinitis, unspecified: Secondary | ICD-10-CM | POA: Diagnosis not present

## 2012-09-28 DIAGNOSIS — J309 Allergic rhinitis, unspecified: Secondary | ICD-10-CM | POA: Diagnosis not present

## 2012-10-05 DIAGNOSIS — J309 Allergic rhinitis, unspecified: Secondary | ICD-10-CM | POA: Diagnosis not present

## 2012-10-12 DIAGNOSIS — J309 Allergic rhinitis, unspecified: Secondary | ICD-10-CM | POA: Diagnosis not present

## 2012-10-14 DIAGNOSIS — J309 Allergic rhinitis, unspecified: Secondary | ICD-10-CM | POA: Diagnosis not present

## 2012-10-19 DIAGNOSIS — J309 Allergic rhinitis, unspecified: Secondary | ICD-10-CM | POA: Diagnosis not present

## 2012-10-21 DIAGNOSIS — H40019 Open angle with borderline findings, low risk, unspecified eye: Secondary | ICD-10-CM | POA: Diagnosis not present

## 2012-10-21 DIAGNOSIS — D3130 Benign neoplasm of unspecified choroid: Secondary | ICD-10-CM | POA: Diagnosis not present

## 2012-10-21 DIAGNOSIS — E119 Type 2 diabetes mellitus without complications: Secondary | ICD-10-CM | POA: Diagnosis not present

## 2012-10-21 DIAGNOSIS — J309 Allergic rhinitis, unspecified: Secondary | ICD-10-CM | POA: Diagnosis not present

## 2012-10-21 DIAGNOSIS — H01009 Unspecified blepharitis unspecified eye, unspecified eyelid: Secondary | ICD-10-CM | POA: Diagnosis not present

## 2012-10-21 DIAGNOSIS — Z961 Presence of intraocular lens: Secondary | ICD-10-CM | POA: Diagnosis not present

## 2012-10-21 DIAGNOSIS — H04129 Dry eye syndrome of unspecified lacrimal gland: Secondary | ICD-10-CM | POA: Diagnosis not present

## 2012-10-26 DIAGNOSIS — J309 Allergic rhinitis, unspecified: Secondary | ICD-10-CM | POA: Diagnosis not present

## 2012-10-28 DIAGNOSIS — J309 Allergic rhinitis, unspecified: Secondary | ICD-10-CM | POA: Diagnosis not present

## 2012-11-02 DIAGNOSIS — I10 Essential (primary) hypertension: Secondary | ICD-10-CM | POA: Diagnosis not present

## 2012-11-02 DIAGNOSIS — R609 Edema, unspecified: Secondary | ICD-10-CM | POA: Diagnosis not present

## 2012-11-02 DIAGNOSIS — N400 Enlarged prostate without lower urinary tract symptoms: Secondary | ICD-10-CM | POA: Diagnosis not present

## 2012-11-02 DIAGNOSIS — E119 Type 2 diabetes mellitus without complications: Secondary | ICD-10-CM | POA: Diagnosis not present

## 2012-11-02 DIAGNOSIS — E7800 Pure hypercholesterolemia, unspecified: Secondary | ICD-10-CM | POA: Diagnosis not present

## 2012-11-02 DIAGNOSIS — J309 Allergic rhinitis, unspecified: Secondary | ICD-10-CM | POA: Diagnosis not present

## 2012-11-09 DIAGNOSIS — D1801 Hemangioma of skin and subcutaneous tissue: Secondary | ICD-10-CM | POA: Diagnosis not present

## 2012-11-09 DIAGNOSIS — L821 Other seborrheic keratosis: Secondary | ICD-10-CM | POA: Diagnosis not present

## 2012-11-09 DIAGNOSIS — L820 Inflamed seborrheic keratosis: Secondary | ICD-10-CM | POA: Diagnosis not present

## 2012-11-09 DIAGNOSIS — J309 Allergic rhinitis, unspecified: Secondary | ICD-10-CM | POA: Diagnosis not present

## 2012-11-09 DIAGNOSIS — L570 Actinic keratosis: Secondary | ICD-10-CM | POA: Diagnosis not present

## 2012-11-09 DIAGNOSIS — Z85828 Personal history of other malignant neoplasm of skin: Secondary | ICD-10-CM | POA: Diagnosis not present

## 2012-11-17 DIAGNOSIS — R633 Feeding difficulties: Secondary | ICD-10-CM | POA: Diagnosis not present

## 2012-11-18 DIAGNOSIS — J309 Allergic rhinitis, unspecified: Secondary | ICD-10-CM | POA: Diagnosis not present

## 2012-12-01 DIAGNOSIS — J309 Allergic rhinitis, unspecified: Secondary | ICD-10-CM | POA: Diagnosis not present

## 2012-12-07 DIAGNOSIS — J309 Allergic rhinitis, unspecified: Secondary | ICD-10-CM | POA: Diagnosis not present

## 2012-12-14 DIAGNOSIS — J309 Allergic rhinitis, unspecified: Secondary | ICD-10-CM | POA: Diagnosis not present

## 2012-12-21 DIAGNOSIS — J309 Allergic rhinitis, unspecified: Secondary | ICD-10-CM | POA: Diagnosis not present

## 2012-12-28 DIAGNOSIS — J309 Allergic rhinitis, unspecified: Secondary | ICD-10-CM | POA: Diagnosis not present

## 2013-01-04 DIAGNOSIS — J309 Allergic rhinitis, unspecified: Secondary | ICD-10-CM | POA: Diagnosis not present

## 2013-01-11 DIAGNOSIS — J309 Allergic rhinitis, unspecified: Secondary | ICD-10-CM | POA: Diagnosis not present

## 2013-01-18 DIAGNOSIS — J309 Allergic rhinitis, unspecified: Secondary | ICD-10-CM | POA: Diagnosis not present

## 2013-01-19 DIAGNOSIS — H1045 Other chronic allergic conjunctivitis: Secondary | ICD-10-CM | POA: Diagnosis not present

## 2013-01-19 DIAGNOSIS — J301 Allergic rhinitis due to pollen: Secondary | ICD-10-CM | POA: Diagnosis not present

## 2013-01-19 DIAGNOSIS — J3089 Other allergic rhinitis: Secondary | ICD-10-CM | POA: Diagnosis not present

## 2013-01-25 DIAGNOSIS — J309 Allergic rhinitis, unspecified: Secondary | ICD-10-CM | POA: Diagnosis not present

## 2013-01-31 DIAGNOSIS — J309 Allergic rhinitis, unspecified: Secondary | ICD-10-CM | POA: Diagnosis not present

## 2013-01-31 DIAGNOSIS — Z23 Encounter for immunization: Secondary | ICD-10-CM | POA: Diagnosis not present

## 2013-02-09 DIAGNOSIS — J309 Allergic rhinitis, unspecified: Secondary | ICD-10-CM | POA: Diagnosis not present

## 2013-02-15 DIAGNOSIS — J309 Allergic rhinitis, unspecified: Secondary | ICD-10-CM | POA: Diagnosis not present

## 2013-02-17 DIAGNOSIS — J309 Allergic rhinitis, unspecified: Secondary | ICD-10-CM | POA: Diagnosis not present

## 2013-02-22 DIAGNOSIS — J309 Allergic rhinitis, unspecified: Secondary | ICD-10-CM | POA: Diagnosis not present

## 2013-03-02 DIAGNOSIS — J309 Allergic rhinitis, unspecified: Secondary | ICD-10-CM | POA: Diagnosis not present

## 2013-03-08 DIAGNOSIS — J309 Allergic rhinitis, unspecified: Secondary | ICD-10-CM | POA: Diagnosis not present

## 2013-03-10 DIAGNOSIS — J309 Allergic rhinitis, unspecified: Secondary | ICD-10-CM | POA: Diagnosis not present

## 2013-03-14 DIAGNOSIS — J309 Allergic rhinitis, unspecified: Secondary | ICD-10-CM | POA: Diagnosis not present

## 2013-03-16 DIAGNOSIS — J309 Allergic rhinitis, unspecified: Secondary | ICD-10-CM | POA: Diagnosis not present

## 2013-03-21 DIAGNOSIS — J309 Allergic rhinitis, unspecified: Secondary | ICD-10-CM | POA: Diagnosis not present

## 2013-03-29 DIAGNOSIS — J309 Allergic rhinitis, unspecified: Secondary | ICD-10-CM | POA: Diagnosis not present

## 2013-04-05 DIAGNOSIS — J309 Allergic rhinitis, unspecified: Secondary | ICD-10-CM | POA: Diagnosis not present

## 2013-04-11 DIAGNOSIS — J309 Allergic rhinitis, unspecified: Secondary | ICD-10-CM | POA: Diagnosis not present

## 2013-04-19 DIAGNOSIS — J309 Allergic rhinitis, unspecified: Secondary | ICD-10-CM | POA: Diagnosis not present

## 2013-04-26 DIAGNOSIS — J309 Allergic rhinitis, unspecified: Secondary | ICD-10-CM | POA: Diagnosis not present

## 2013-05-03 DIAGNOSIS — J309 Allergic rhinitis, unspecified: Secondary | ICD-10-CM | POA: Diagnosis not present

## 2013-05-06 DIAGNOSIS — Z23 Encounter for immunization: Secondary | ICD-10-CM | POA: Diagnosis not present

## 2013-05-06 DIAGNOSIS — E7800 Pure hypercholesterolemia, unspecified: Secondary | ICD-10-CM | POA: Diagnosis not present

## 2013-05-06 DIAGNOSIS — E119 Type 2 diabetes mellitus without complications: Secondary | ICD-10-CM | POA: Diagnosis not present

## 2013-05-06 DIAGNOSIS — R609 Edema, unspecified: Secondary | ICD-10-CM | POA: Diagnosis not present

## 2013-05-06 DIAGNOSIS — K2100 Gastro-esophageal reflux disease with esophagitis, without bleeding: Secondary | ICD-10-CM | POA: Diagnosis not present

## 2013-05-06 DIAGNOSIS — N400 Enlarged prostate without lower urinary tract symptoms: Secondary | ICD-10-CM | POA: Diagnosis not present

## 2013-05-06 DIAGNOSIS — I10 Essential (primary) hypertension: Secondary | ICD-10-CM | POA: Diagnosis not present

## 2013-05-11 DIAGNOSIS — J309 Allergic rhinitis, unspecified: Secondary | ICD-10-CM | POA: Diagnosis not present

## 2013-05-17 DIAGNOSIS — J309 Allergic rhinitis, unspecified: Secondary | ICD-10-CM | POA: Diagnosis not present

## 2013-05-24 DIAGNOSIS — J309 Allergic rhinitis, unspecified: Secondary | ICD-10-CM | POA: Diagnosis not present

## 2013-05-25 DIAGNOSIS — H40019 Open angle with borderline findings, low risk, unspecified eye: Secondary | ICD-10-CM | POA: Diagnosis not present

## 2013-05-31 DIAGNOSIS — J309 Allergic rhinitis, unspecified: Secondary | ICD-10-CM | POA: Diagnosis not present

## 2013-06-06 DIAGNOSIS — J309 Allergic rhinitis, unspecified: Secondary | ICD-10-CM | POA: Diagnosis not present

## 2013-06-15 DIAGNOSIS — J309 Allergic rhinitis, unspecified: Secondary | ICD-10-CM | POA: Diagnosis not present

## 2013-06-21 DIAGNOSIS — J309 Allergic rhinitis, unspecified: Secondary | ICD-10-CM | POA: Diagnosis not present

## 2013-06-28 DIAGNOSIS — J309 Allergic rhinitis, unspecified: Secondary | ICD-10-CM | POA: Diagnosis not present

## 2013-07-06 DIAGNOSIS — J309 Allergic rhinitis, unspecified: Secondary | ICD-10-CM | POA: Diagnosis not present

## 2013-07-12 DIAGNOSIS — J309 Allergic rhinitis, unspecified: Secondary | ICD-10-CM | POA: Diagnosis not present

## 2013-07-13 DIAGNOSIS — J309 Allergic rhinitis, unspecified: Secondary | ICD-10-CM | POA: Diagnosis not present

## 2013-07-20 DIAGNOSIS — J309 Allergic rhinitis, unspecified: Secondary | ICD-10-CM | POA: Diagnosis not present

## 2013-07-26 DIAGNOSIS — J309 Allergic rhinitis, unspecified: Secondary | ICD-10-CM | POA: Diagnosis not present

## 2013-07-28 DIAGNOSIS — J309 Allergic rhinitis, unspecified: Secondary | ICD-10-CM | POA: Diagnosis not present

## 2013-07-29 DIAGNOSIS — H903 Sensorineural hearing loss, bilateral: Secondary | ICD-10-CM | POA: Diagnosis not present

## 2013-07-29 DIAGNOSIS — B3784 Candidal otitis externa: Secondary | ICD-10-CM | POA: Diagnosis not present

## 2013-07-29 DIAGNOSIS — H6120 Impacted cerumen, unspecified ear: Secondary | ICD-10-CM | POA: Diagnosis not present

## 2013-08-02 DIAGNOSIS — J309 Allergic rhinitis, unspecified: Secondary | ICD-10-CM | POA: Diagnosis not present

## 2013-08-05 DIAGNOSIS — J309 Allergic rhinitis, unspecified: Secondary | ICD-10-CM | POA: Diagnosis not present

## 2013-08-09 DIAGNOSIS — J309 Allergic rhinitis, unspecified: Secondary | ICD-10-CM | POA: Diagnosis not present

## 2013-08-16 DIAGNOSIS — J309 Allergic rhinitis, unspecified: Secondary | ICD-10-CM | POA: Diagnosis not present

## 2013-08-23 DIAGNOSIS — J309 Allergic rhinitis, unspecified: Secondary | ICD-10-CM | POA: Diagnosis not present

## 2013-08-30 DIAGNOSIS — J309 Allergic rhinitis, unspecified: Secondary | ICD-10-CM | POA: Diagnosis not present

## 2013-09-06 DIAGNOSIS — J309 Allergic rhinitis, unspecified: Secondary | ICD-10-CM | POA: Diagnosis not present

## 2013-09-14 DIAGNOSIS — J309 Allergic rhinitis, unspecified: Secondary | ICD-10-CM | POA: Diagnosis not present

## 2013-09-22 DIAGNOSIS — J309 Allergic rhinitis, unspecified: Secondary | ICD-10-CM | POA: Diagnosis not present

## 2013-09-27 DIAGNOSIS — J309 Allergic rhinitis, unspecified: Secondary | ICD-10-CM | POA: Diagnosis not present

## 2013-10-04 DIAGNOSIS — J309 Allergic rhinitis, unspecified: Secondary | ICD-10-CM | POA: Diagnosis not present

## 2013-10-11 DIAGNOSIS — J309 Allergic rhinitis, unspecified: Secondary | ICD-10-CM | POA: Diagnosis not present

## 2013-10-18 DIAGNOSIS — J309 Allergic rhinitis, unspecified: Secondary | ICD-10-CM | POA: Diagnosis not present

## 2013-10-25 DIAGNOSIS — J309 Allergic rhinitis, unspecified: Secondary | ICD-10-CM | POA: Diagnosis not present

## 2013-11-01 DIAGNOSIS — J309 Allergic rhinitis, unspecified: Secondary | ICD-10-CM | POA: Diagnosis not present

## 2013-11-04 DIAGNOSIS — E7800 Pure hypercholesterolemia, unspecified: Secondary | ICD-10-CM | POA: Diagnosis not present

## 2013-11-04 DIAGNOSIS — E119 Type 2 diabetes mellitus without complications: Secondary | ICD-10-CM | POA: Diagnosis not present

## 2013-11-04 DIAGNOSIS — R609 Edema, unspecified: Secondary | ICD-10-CM | POA: Diagnosis not present

## 2013-11-04 DIAGNOSIS — N400 Enlarged prostate without lower urinary tract symptoms: Secondary | ICD-10-CM | POA: Diagnosis not present

## 2013-11-04 DIAGNOSIS — Z23 Encounter for immunization: Secondary | ICD-10-CM | POA: Diagnosis not present

## 2013-11-04 DIAGNOSIS — K2100 Gastro-esophageal reflux disease with esophagitis, without bleeding: Secondary | ICD-10-CM | POA: Diagnosis not present

## 2013-11-04 DIAGNOSIS — I10 Essential (primary) hypertension: Secondary | ICD-10-CM | POA: Diagnosis not present

## 2013-11-08 ENCOUNTER — Other Ambulatory Visit: Payer: Self-pay | Admitting: Dermatology

## 2013-11-08 DIAGNOSIS — L821 Other seborrheic keratosis: Secondary | ICD-10-CM | POA: Diagnosis not present

## 2013-11-08 DIAGNOSIS — L570 Actinic keratosis: Secondary | ICD-10-CM | POA: Diagnosis not present

## 2013-11-08 DIAGNOSIS — C44711 Basal cell carcinoma of skin of unspecified lower limb, including hip: Secondary | ICD-10-CM | POA: Diagnosis not present

## 2013-11-08 DIAGNOSIS — L820 Inflamed seborrheic keratosis: Secondary | ICD-10-CM | POA: Diagnosis not present

## 2013-11-08 DIAGNOSIS — D485 Neoplasm of uncertain behavior of skin: Secondary | ICD-10-CM | POA: Diagnosis not present

## 2013-11-08 DIAGNOSIS — D1801 Hemangioma of skin and subcutaneous tissue: Secondary | ICD-10-CM | POA: Diagnosis not present

## 2013-11-08 DIAGNOSIS — Z85828 Personal history of other malignant neoplasm of skin: Secondary | ICD-10-CM | POA: Diagnosis not present

## 2013-11-09 DIAGNOSIS — J309 Allergic rhinitis, unspecified: Secondary | ICD-10-CM | POA: Diagnosis not present

## 2013-11-22 DIAGNOSIS — J309 Allergic rhinitis, unspecified: Secondary | ICD-10-CM | POA: Diagnosis not present

## 2013-11-29 DIAGNOSIS — J309 Allergic rhinitis, unspecified: Secondary | ICD-10-CM | POA: Diagnosis not present

## 2013-12-01 DIAGNOSIS — J309 Allergic rhinitis, unspecified: Secondary | ICD-10-CM | POA: Diagnosis not present

## 2013-12-06 DIAGNOSIS — J309 Allergic rhinitis, unspecified: Secondary | ICD-10-CM | POA: Diagnosis not present

## 2013-12-13 DIAGNOSIS — H40019 Open angle with borderline findings, low risk, unspecified eye: Secondary | ICD-10-CM | POA: Diagnosis not present

## 2013-12-13 DIAGNOSIS — J309 Allergic rhinitis, unspecified: Secondary | ICD-10-CM | POA: Diagnosis not present

## 2013-12-13 DIAGNOSIS — H16149 Punctate keratitis, unspecified eye: Secondary | ICD-10-CM | POA: Diagnosis not present

## 2013-12-13 DIAGNOSIS — E119 Type 2 diabetes mellitus without complications: Secondary | ICD-10-CM | POA: Diagnosis not present

## 2013-12-13 DIAGNOSIS — H18599 Other hereditary corneal dystrophies, unspecified eye: Secondary | ICD-10-CM | POA: Diagnosis not present

## 2013-12-20 DIAGNOSIS — J309 Allergic rhinitis, unspecified: Secondary | ICD-10-CM | POA: Diagnosis not present

## 2013-12-22 DIAGNOSIS — J309 Allergic rhinitis, unspecified: Secondary | ICD-10-CM | POA: Diagnosis not present

## 2013-12-28 DIAGNOSIS — J309 Allergic rhinitis, unspecified: Secondary | ICD-10-CM | POA: Diagnosis not present

## 2013-12-30 DIAGNOSIS — J309 Allergic rhinitis, unspecified: Secondary | ICD-10-CM | POA: Diagnosis not present

## 2014-01-03 DIAGNOSIS — J309 Allergic rhinitis, unspecified: Secondary | ICD-10-CM | POA: Diagnosis not present

## 2014-01-10 DIAGNOSIS — J309 Allergic rhinitis, unspecified: Secondary | ICD-10-CM | POA: Diagnosis not present

## 2014-01-17 DIAGNOSIS — J309 Allergic rhinitis, unspecified: Secondary | ICD-10-CM | POA: Diagnosis not present

## 2014-01-20 DIAGNOSIS — H1045 Other chronic allergic conjunctivitis: Secondary | ICD-10-CM | POA: Diagnosis not present

## 2014-01-20 DIAGNOSIS — J301 Allergic rhinitis due to pollen: Secondary | ICD-10-CM | POA: Diagnosis not present

## 2014-01-20 DIAGNOSIS — J3089 Other allergic rhinitis: Secondary | ICD-10-CM | POA: Diagnosis not present

## 2014-01-24 DIAGNOSIS — J3089 Other allergic rhinitis: Secondary | ICD-10-CM | POA: Diagnosis not present

## 2014-01-24 DIAGNOSIS — J301 Allergic rhinitis due to pollen: Secondary | ICD-10-CM | POA: Diagnosis not present

## 2014-01-24 DIAGNOSIS — Z23 Encounter for immunization: Secondary | ICD-10-CM | POA: Diagnosis not present

## 2014-02-01 DIAGNOSIS — J3089 Other allergic rhinitis: Secondary | ICD-10-CM | POA: Diagnosis not present

## 2014-02-01 DIAGNOSIS — J301 Allergic rhinitis due to pollen: Secondary | ICD-10-CM | POA: Diagnosis not present

## 2014-02-08 DIAGNOSIS — J3089 Other allergic rhinitis: Secondary | ICD-10-CM | POA: Diagnosis not present

## 2014-02-08 DIAGNOSIS — J301 Allergic rhinitis due to pollen: Secondary | ICD-10-CM | POA: Diagnosis not present

## 2014-02-14 DIAGNOSIS — J301 Allergic rhinitis due to pollen: Secondary | ICD-10-CM | POA: Diagnosis not present

## 2014-02-14 DIAGNOSIS — J3089 Other allergic rhinitis: Secondary | ICD-10-CM | POA: Diagnosis not present

## 2014-02-21 DIAGNOSIS — J3089 Other allergic rhinitis: Secondary | ICD-10-CM | POA: Diagnosis not present

## 2014-02-21 DIAGNOSIS — J301 Allergic rhinitis due to pollen: Secondary | ICD-10-CM | POA: Diagnosis not present

## 2014-02-28 DIAGNOSIS — J3089 Other allergic rhinitis: Secondary | ICD-10-CM | POA: Diagnosis not present

## 2014-02-28 DIAGNOSIS — J301 Allergic rhinitis due to pollen: Secondary | ICD-10-CM | POA: Diagnosis not present

## 2014-03-07 DIAGNOSIS — J301 Allergic rhinitis due to pollen: Secondary | ICD-10-CM | POA: Diagnosis not present

## 2014-03-07 DIAGNOSIS — J309 Allergic rhinitis, unspecified: Secondary | ICD-10-CM | POA: Diagnosis not present

## 2014-03-14 DIAGNOSIS — J3089 Other allergic rhinitis: Secondary | ICD-10-CM | POA: Diagnosis not present

## 2014-03-14 DIAGNOSIS — J301 Allergic rhinitis due to pollen: Secondary | ICD-10-CM | POA: Diagnosis not present

## 2014-03-21 DIAGNOSIS — J301 Allergic rhinitis due to pollen: Secondary | ICD-10-CM | POA: Diagnosis not present

## 2014-03-21 DIAGNOSIS — J3089 Other allergic rhinitis: Secondary | ICD-10-CM | POA: Diagnosis not present

## 2014-03-31 DIAGNOSIS — J3089 Other allergic rhinitis: Secondary | ICD-10-CM | POA: Diagnosis not present

## 2014-03-31 DIAGNOSIS — J301 Allergic rhinitis due to pollen: Secondary | ICD-10-CM | POA: Diagnosis not present

## 2014-04-04 DIAGNOSIS — B3784 Candidal otitis externa: Secondary | ICD-10-CM | POA: Diagnosis not present

## 2014-04-04 DIAGNOSIS — J301 Allergic rhinitis due to pollen: Secondary | ICD-10-CM | POA: Diagnosis not present

## 2014-04-04 DIAGNOSIS — J3089 Other allergic rhinitis: Secondary | ICD-10-CM | POA: Diagnosis not present

## 2014-04-04 DIAGNOSIS — J310 Chronic rhinitis: Secondary | ICD-10-CM | POA: Diagnosis not present

## 2014-04-04 DIAGNOSIS — H6122 Impacted cerumen, left ear: Secondary | ICD-10-CM | POA: Diagnosis not present

## 2014-04-04 DIAGNOSIS — H903 Sensorineural hearing loss, bilateral: Secondary | ICD-10-CM | POA: Diagnosis not present

## 2014-04-04 DIAGNOSIS — K219 Gastro-esophageal reflux disease without esophagitis: Secondary | ICD-10-CM | POA: Diagnosis not present

## 2014-04-05 DIAGNOSIS — R05 Cough: Secondary | ICD-10-CM | POA: Diagnosis not present

## 2014-04-11 DIAGNOSIS — J3089 Other allergic rhinitis: Secondary | ICD-10-CM | POA: Diagnosis not present

## 2014-04-11 DIAGNOSIS — J301 Allergic rhinitis due to pollen: Secondary | ICD-10-CM | POA: Diagnosis not present

## 2014-04-18 DIAGNOSIS — J3089 Other allergic rhinitis: Secondary | ICD-10-CM | POA: Diagnosis not present

## 2014-04-18 DIAGNOSIS — J301 Allergic rhinitis due to pollen: Secondary | ICD-10-CM | POA: Diagnosis not present

## 2014-04-25 DIAGNOSIS — J3089 Other allergic rhinitis: Secondary | ICD-10-CM | POA: Diagnosis not present

## 2014-04-25 DIAGNOSIS — J301 Allergic rhinitis due to pollen: Secondary | ICD-10-CM | POA: Diagnosis not present

## 2014-05-02 DIAGNOSIS — J301 Allergic rhinitis due to pollen: Secondary | ICD-10-CM | POA: Diagnosis not present

## 2014-05-02 DIAGNOSIS — J3089 Other allergic rhinitis: Secondary | ICD-10-CM | POA: Diagnosis not present

## 2014-05-03 DIAGNOSIS — J3089 Other allergic rhinitis: Secondary | ICD-10-CM | POA: Diagnosis not present

## 2014-05-03 DIAGNOSIS — J301 Allergic rhinitis due to pollen: Secondary | ICD-10-CM | POA: Diagnosis not present

## 2014-05-09 DIAGNOSIS — J309 Allergic rhinitis, unspecified: Secondary | ICD-10-CM | POA: Diagnosis not present

## 2014-05-09 DIAGNOSIS — J3089 Other allergic rhinitis: Secondary | ICD-10-CM | POA: Diagnosis not present

## 2014-05-09 DIAGNOSIS — E780 Pure hypercholesterolemia: Secondary | ICD-10-CM | POA: Diagnosis not present

## 2014-05-09 DIAGNOSIS — I10 Essential (primary) hypertension: Secondary | ICD-10-CM | POA: Diagnosis not present

## 2014-05-09 DIAGNOSIS — E119 Type 2 diabetes mellitus without complications: Secondary | ICD-10-CM | POA: Diagnosis not present

## 2014-05-09 DIAGNOSIS — J301 Allergic rhinitis due to pollen: Secondary | ICD-10-CM | POA: Diagnosis not present

## 2014-05-09 DIAGNOSIS — K219 Gastro-esophageal reflux disease without esophagitis: Secondary | ICD-10-CM | POA: Diagnosis not present

## 2014-05-09 DIAGNOSIS — N400 Enlarged prostate without lower urinary tract symptoms: Secondary | ICD-10-CM | POA: Diagnosis not present

## 2014-05-09 DIAGNOSIS — R601 Generalized edema: Secondary | ICD-10-CM | POA: Diagnosis not present

## 2014-05-16 DIAGNOSIS — J301 Allergic rhinitis due to pollen: Secondary | ICD-10-CM | POA: Diagnosis not present

## 2014-05-16 DIAGNOSIS — J3089 Other allergic rhinitis: Secondary | ICD-10-CM | POA: Diagnosis not present

## 2014-05-18 DIAGNOSIS — J301 Allergic rhinitis due to pollen: Secondary | ICD-10-CM | POA: Diagnosis not present

## 2014-05-18 DIAGNOSIS — J3089 Other allergic rhinitis: Secondary | ICD-10-CM | POA: Diagnosis not present

## 2014-05-23 DIAGNOSIS — J3089 Other allergic rhinitis: Secondary | ICD-10-CM | POA: Diagnosis not present

## 2014-05-23 DIAGNOSIS — J301 Allergic rhinitis due to pollen: Secondary | ICD-10-CM | POA: Diagnosis not present

## 2014-05-26 DIAGNOSIS — J301 Allergic rhinitis due to pollen: Secondary | ICD-10-CM | POA: Diagnosis not present

## 2014-05-26 DIAGNOSIS — J3089 Other allergic rhinitis: Secondary | ICD-10-CM | POA: Diagnosis not present

## 2014-05-30 DIAGNOSIS — J301 Allergic rhinitis due to pollen: Secondary | ICD-10-CM | POA: Diagnosis not present

## 2014-05-30 DIAGNOSIS — J3089 Other allergic rhinitis: Secondary | ICD-10-CM | POA: Diagnosis not present

## 2014-06-06 DIAGNOSIS — J301 Allergic rhinitis due to pollen: Secondary | ICD-10-CM | POA: Diagnosis not present

## 2014-06-06 DIAGNOSIS — J3089 Other allergic rhinitis: Secondary | ICD-10-CM | POA: Diagnosis not present

## 2014-06-13 DIAGNOSIS — J301 Allergic rhinitis due to pollen: Secondary | ICD-10-CM | POA: Diagnosis not present

## 2014-06-13 DIAGNOSIS — J3089 Other allergic rhinitis: Secondary | ICD-10-CM | POA: Diagnosis not present

## 2014-06-20 DIAGNOSIS — J3089 Other allergic rhinitis: Secondary | ICD-10-CM | POA: Diagnosis not present

## 2014-06-20 DIAGNOSIS — J301 Allergic rhinitis due to pollen: Secondary | ICD-10-CM | POA: Diagnosis not present

## 2014-06-27 DIAGNOSIS — J3089 Other allergic rhinitis: Secondary | ICD-10-CM | POA: Diagnosis not present

## 2014-06-27 DIAGNOSIS — J301 Allergic rhinitis due to pollen: Secondary | ICD-10-CM | POA: Diagnosis not present

## 2014-07-04 DIAGNOSIS — J301 Allergic rhinitis due to pollen: Secondary | ICD-10-CM | POA: Diagnosis not present

## 2014-07-04 DIAGNOSIS — J3089 Other allergic rhinitis: Secondary | ICD-10-CM | POA: Diagnosis not present

## 2014-07-11 DIAGNOSIS — J3081 Allergic rhinitis due to animal (cat) (dog) hair and dander: Secondary | ICD-10-CM | POA: Diagnosis not present

## 2014-07-11 DIAGNOSIS — J3089 Other allergic rhinitis: Secondary | ICD-10-CM | POA: Diagnosis not present

## 2014-07-11 DIAGNOSIS — J301 Allergic rhinitis due to pollen: Secondary | ICD-10-CM | POA: Diagnosis not present

## 2014-07-18 DIAGNOSIS — J301 Allergic rhinitis due to pollen: Secondary | ICD-10-CM | POA: Diagnosis not present

## 2014-07-18 DIAGNOSIS — J3089 Other allergic rhinitis: Secondary | ICD-10-CM | POA: Diagnosis not present

## 2014-07-25 DIAGNOSIS — J301 Allergic rhinitis due to pollen: Secondary | ICD-10-CM | POA: Diagnosis not present

## 2014-07-25 DIAGNOSIS — H6122 Impacted cerumen, left ear: Secondary | ICD-10-CM | POA: Diagnosis not present

## 2014-07-25 DIAGNOSIS — J3089 Other allergic rhinitis: Secondary | ICD-10-CM | POA: Diagnosis not present

## 2014-08-01 DIAGNOSIS — J3089 Other allergic rhinitis: Secondary | ICD-10-CM | POA: Diagnosis not present

## 2014-08-01 DIAGNOSIS — J301 Allergic rhinitis due to pollen: Secondary | ICD-10-CM | POA: Diagnosis not present

## 2014-08-08 DIAGNOSIS — J301 Allergic rhinitis due to pollen: Secondary | ICD-10-CM | POA: Diagnosis not present

## 2014-08-08 DIAGNOSIS — J3089 Other allergic rhinitis: Secondary | ICD-10-CM | POA: Diagnosis not present

## 2014-08-10 DIAGNOSIS — J209 Acute bronchitis, unspecified: Secondary | ICD-10-CM | POA: Diagnosis not present

## 2014-08-15 DIAGNOSIS — J3089 Other allergic rhinitis: Secondary | ICD-10-CM | POA: Diagnosis not present

## 2014-08-15 DIAGNOSIS — J301 Allergic rhinitis due to pollen: Secondary | ICD-10-CM | POA: Diagnosis not present

## 2014-08-22 DIAGNOSIS — J301 Allergic rhinitis due to pollen: Secondary | ICD-10-CM | POA: Diagnosis not present

## 2014-08-22 DIAGNOSIS — J3089 Other allergic rhinitis: Secondary | ICD-10-CM | POA: Diagnosis not present

## 2014-08-29 DIAGNOSIS — J3089 Other allergic rhinitis: Secondary | ICD-10-CM | POA: Diagnosis not present

## 2014-08-29 DIAGNOSIS — J301 Allergic rhinitis due to pollen: Secondary | ICD-10-CM | POA: Diagnosis not present

## 2014-09-05 DIAGNOSIS — J301 Allergic rhinitis due to pollen: Secondary | ICD-10-CM | POA: Diagnosis not present

## 2014-09-05 DIAGNOSIS — J3089 Other allergic rhinitis: Secondary | ICD-10-CM | POA: Diagnosis not present

## 2014-09-12 DIAGNOSIS — J301 Allergic rhinitis due to pollen: Secondary | ICD-10-CM | POA: Diagnosis not present

## 2014-09-12 DIAGNOSIS — J3089 Other allergic rhinitis: Secondary | ICD-10-CM | POA: Diagnosis not present

## 2014-09-19 DIAGNOSIS — J301 Allergic rhinitis due to pollen: Secondary | ICD-10-CM | POA: Diagnosis not present

## 2014-09-19 DIAGNOSIS — J3089 Other allergic rhinitis: Secondary | ICD-10-CM | POA: Diagnosis not present

## 2014-09-26 DIAGNOSIS — J3089 Other allergic rhinitis: Secondary | ICD-10-CM | POA: Diagnosis not present

## 2014-09-26 DIAGNOSIS — J301 Allergic rhinitis due to pollen: Secondary | ICD-10-CM | POA: Diagnosis not present

## 2014-10-03 DIAGNOSIS — J301 Allergic rhinitis due to pollen: Secondary | ICD-10-CM | POA: Diagnosis not present

## 2014-10-03 DIAGNOSIS — J3089 Other allergic rhinitis: Secondary | ICD-10-CM | POA: Diagnosis not present

## 2014-10-05 DIAGNOSIS — J301 Allergic rhinitis due to pollen: Secondary | ICD-10-CM | POA: Diagnosis not present

## 2014-10-05 DIAGNOSIS — J3089 Other allergic rhinitis: Secondary | ICD-10-CM | POA: Diagnosis not present

## 2014-10-09 DIAGNOSIS — J3089 Other allergic rhinitis: Secondary | ICD-10-CM | POA: Diagnosis not present

## 2014-10-09 DIAGNOSIS — J301 Allergic rhinitis due to pollen: Secondary | ICD-10-CM | POA: Diagnosis not present

## 2014-10-11 DIAGNOSIS — J3089 Other allergic rhinitis: Secondary | ICD-10-CM | POA: Diagnosis not present

## 2014-10-11 DIAGNOSIS — J301 Allergic rhinitis due to pollen: Secondary | ICD-10-CM | POA: Diagnosis not present

## 2014-10-17 DIAGNOSIS — J301 Allergic rhinitis due to pollen: Secondary | ICD-10-CM | POA: Diagnosis not present

## 2014-10-17 DIAGNOSIS — J3089 Other allergic rhinitis: Secondary | ICD-10-CM | POA: Diagnosis not present

## 2014-10-25 DIAGNOSIS — J3089 Other allergic rhinitis: Secondary | ICD-10-CM | POA: Diagnosis not present

## 2014-10-25 DIAGNOSIS — J301 Allergic rhinitis due to pollen: Secondary | ICD-10-CM | POA: Diagnosis not present

## 2014-10-31 DIAGNOSIS — N400 Enlarged prostate without lower urinary tract symptoms: Secondary | ICD-10-CM | POA: Diagnosis not present

## 2014-10-31 DIAGNOSIS — E780 Pure hypercholesterolemia: Secondary | ICD-10-CM | POA: Diagnosis not present

## 2014-10-31 DIAGNOSIS — E119 Type 2 diabetes mellitus without complications: Secondary | ICD-10-CM | POA: Diagnosis not present

## 2014-10-31 DIAGNOSIS — J309 Allergic rhinitis, unspecified: Secondary | ICD-10-CM | POA: Diagnosis not present

## 2014-10-31 DIAGNOSIS — I10 Essential (primary) hypertension: Secondary | ICD-10-CM | POA: Diagnosis not present

## 2014-11-01 DIAGNOSIS — J301 Allergic rhinitis due to pollen: Secondary | ICD-10-CM | POA: Diagnosis not present

## 2014-11-01 DIAGNOSIS — J3089 Other allergic rhinitis: Secondary | ICD-10-CM | POA: Diagnosis not present

## 2014-11-07 DIAGNOSIS — J301 Allergic rhinitis due to pollen: Secondary | ICD-10-CM | POA: Diagnosis not present

## 2014-11-07 DIAGNOSIS — J3089 Other allergic rhinitis: Secondary | ICD-10-CM | POA: Diagnosis not present

## 2014-11-09 DIAGNOSIS — L821 Other seborrheic keratosis: Secondary | ICD-10-CM | POA: Diagnosis not present

## 2014-11-09 DIAGNOSIS — L570 Actinic keratosis: Secondary | ICD-10-CM | POA: Diagnosis not present

## 2014-11-09 DIAGNOSIS — D1801 Hemangioma of skin and subcutaneous tissue: Secondary | ICD-10-CM | POA: Diagnosis not present

## 2014-11-09 DIAGNOSIS — L820 Inflamed seborrheic keratosis: Secondary | ICD-10-CM | POA: Diagnosis not present

## 2014-11-09 DIAGNOSIS — L565 Disseminated superficial actinic porokeratosis (DSAP): Secondary | ICD-10-CM | POA: Diagnosis not present

## 2014-11-09 DIAGNOSIS — Z85828 Personal history of other malignant neoplasm of skin: Secondary | ICD-10-CM | POA: Diagnosis not present

## 2014-11-14 DIAGNOSIS — J3089 Other allergic rhinitis: Secondary | ICD-10-CM | POA: Diagnosis not present

## 2014-11-14 DIAGNOSIS — J301 Allergic rhinitis due to pollen: Secondary | ICD-10-CM | POA: Diagnosis not present

## 2014-11-21 DIAGNOSIS — J301 Allergic rhinitis due to pollen: Secondary | ICD-10-CM | POA: Diagnosis not present

## 2014-11-21 DIAGNOSIS — J3089 Other allergic rhinitis: Secondary | ICD-10-CM | POA: Diagnosis not present

## 2014-11-28 DIAGNOSIS — J3089 Other allergic rhinitis: Secondary | ICD-10-CM | POA: Diagnosis not present

## 2014-11-28 DIAGNOSIS — J301 Allergic rhinitis due to pollen: Secondary | ICD-10-CM | POA: Diagnosis not present

## 2014-12-05 DIAGNOSIS — J301 Allergic rhinitis due to pollen: Secondary | ICD-10-CM | POA: Diagnosis not present

## 2014-12-05 DIAGNOSIS — J3089 Other allergic rhinitis: Secondary | ICD-10-CM | POA: Diagnosis not present

## 2014-12-12 DIAGNOSIS — J3089 Other allergic rhinitis: Secondary | ICD-10-CM | POA: Diagnosis not present

## 2014-12-12 DIAGNOSIS — J301 Allergic rhinitis due to pollen: Secondary | ICD-10-CM | POA: Diagnosis not present

## 2014-12-14 DIAGNOSIS — Z0100 Encounter for examination of eyes and vision without abnormal findings: Secondary | ICD-10-CM | POA: Diagnosis not present

## 2014-12-14 DIAGNOSIS — H35373 Puckering of macula, bilateral: Secondary | ICD-10-CM | POA: Diagnosis not present

## 2014-12-14 DIAGNOSIS — Z961 Presence of intraocular lens: Secondary | ICD-10-CM | POA: Diagnosis not present

## 2014-12-19 DIAGNOSIS — J301 Allergic rhinitis due to pollen: Secondary | ICD-10-CM | POA: Diagnosis not present

## 2014-12-19 DIAGNOSIS — J3089 Other allergic rhinitis: Secondary | ICD-10-CM | POA: Diagnosis not present

## 2014-12-27 DIAGNOSIS — J301 Allergic rhinitis due to pollen: Secondary | ICD-10-CM | POA: Diagnosis not present

## 2014-12-27 DIAGNOSIS — J3089 Other allergic rhinitis: Secondary | ICD-10-CM | POA: Diagnosis not present

## 2015-01-02 DIAGNOSIS — J301 Allergic rhinitis due to pollen: Secondary | ICD-10-CM | POA: Diagnosis not present

## 2015-01-02 DIAGNOSIS — J3089 Other allergic rhinitis: Secondary | ICD-10-CM | POA: Diagnosis not present

## 2015-01-09 DIAGNOSIS — J301 Allergic rhinitis due to pollen: Secondary | ICD-10-CM | POA: Diagnosis not present

## 2015-01-09 DIAGNOSIS — J3089 Other allergic rhinitis: Secondary | ICD-10-CM | POA: Diagnosis not present

## 2015-01-16 DIAGNOSIS — J301 Allergic rhinitis due to pollen: Secondary | ICD-10-CM | POA: Diagnosis not present

## 2015-01-16 DIAGNOSIS — J3089 Other allergic rhinitis: Secondary | ICD-10-CM | POA: Diagnosis not present

## 2015-01-17 DIAGNOSIS — H6123 Impacted cerumen, bilateral: Secondary | ICD-10-CM | POA: Diagnosis not present

## 2015-01-23 DIAGNOSIS — J301 Allergic rhinitis due to pollen: Secondary | ICD-10-CM | POA: Diagnosis not present

## 2015-01-23 DIAGNOSIS — J3089 Other allergic rhinitis: Secondary | ICD-10-CM | POA: Diagnosis not present

## 2015-01-26 DIAGNOSIS — J3089 Other allergic rhinitis: Secondary | ICD-10-CM | POA: Diagnosis not present

## 2015-01-26 DIAGNOSIS — J301 Allergic rhinitis due to pollen: Secondary | ICD-10-CM | POA: Diagnosis not present

## 2015-01-31 DIAGNOSIS — J3089 Other allergic rhinitis: Secondary | ICD-10-CM | POA: Diagnosis not present

## 2015-01-31 DIAGNOSIS — J301 Allergic rhinitis due to pollen: Secondary | ICD-10-CM | POA: Diagnosis not present

## 2015-02-06 DIAGNOSIS — J301 Allergic rhinitis due to pollen: Secondary | ICD-10-CM | POA: Diagnosis not present

## 2015-02-06 DIAGNOSIS — J3089 Other allergic rhinitis: Secondary | ICD-10-CM | POA: Diagnosis not present

## 2015-02-13 DIAGNOSIS — J301 Allergic rhinitis due to pollen: Secondary | ICD-10-CM | POA: Diagnosis not present

## 2015-02-13 DIAGNOSIS — J3089 Other allergic rhinitis: Secondary | ICD-10-CM | POA: Diagnosis not present

## 2015-02-14 DIAGNOSIS — Z23 Encounter for immunization: Secondary | ICD-10-CM | POA: Diagnosis not present

## 2015-02-19 DIAGNOSIS — J3089 Other allergic rhinitis: Secondary | ICD-10-CM | POA: Diagnosis not present

## 2015-02-19 DIAGNOSIS — J301 Allergic rhinitis due to pollen: Secondary | ICD-10-CM | POA: Diagnosis not present

## 2015-02-20 DIAGNOSIS — J301 Allergic rhinitis due to pollen: Secondary | ICD-10-CM | POA: Diagnosis not present

## 2015-02-20 DIAGNOSIS — J3089 Other allergic rhinitis: Secondary | ICD-10-CM | POA: Diagnosis not present

## 2015-02-27 DIAGNOSIS — B3784 Candidal otitis externa: Secondary | ICD-10-CM | POA: Diagnosis not present

## 2015-02-27 DIAGNOSIS — J3089 Other allergic rhinitis: Secondary | ICD-10-CM | POA: Diagnosis not present

## 2015-02-27 DIAGNOSIS — H6241 Otitis externa in other diseases classified elsewhere, right ear: Secondary | ICD-10-CM | POA: Diagnosis not present

## 2015-02-27 DIAGNOSIS — J301 Allergic rhinitis due to pollen: Secondary | ICD-10-CM | POA: Diagnosis not present

## 2015-02-27 DIAGNOSIS — H6121 Impacted cerumen, right ear: Secondary | ICD-10-CM | POA: Diagnosis not present

## 2015-03-01 DIAGNOSIS — J3089 Other allergic rhinitis: Secondary | ICD-10-CM | POA: Diagnosis not present

## 2015-03-01 DIAGNOSIS — J301 Allergic rhinitis due to pollen: Secondary | ICD-10-CM | POA: Diagnosis not present

## 2015-03-06 DIAGNOSIS — J301 Allergic rhinitis due to pollen: Secondary | ICD-10-CM | POA: Diagnosis not present

## 2015-03-06 DIAGNOSIS — J3089 Other allergic rhinitis: Secondary | ICD-10-CM | POA: Diagnosis not present

## 2015-03-08 DIAGNOSIS — J301 Allergic rhinitis due to pollen: Secondary | ICD-10-CM | POA: Diagnosis not present

## 2015-03-08 DIAGNOSIS — J3089 Other allergic rhinitis: Secondary | ICD-10-CM | POA: Diagnosis not present

## 2015-03-13 DIAGNOSIS — J3089 Other allergic rhinitis: Secondary | ICD-10-CM | POA: Diagnosis not present

## 2015-03-13 DIAGNOSIS — J301 Allergic rhinitis due to pollen: Secondary | ICD-10-CM | POA: Diagnosis not present

## 2015-03-20 DIAGNOSIS — J3089 Other allergic rhinitis: Secondary | ICD-10-CM | POA: Diagnosis not present

## 2015-03-20 DIAGNOSIS — J301 Allergic rhinitis due to pollen: Secondary | ICD-10-CM | POA: Diagnosis not present

## 2015-03-27 DIAGNOSIS — J301 Allergic rhinitis due to pollen: Secondary | ICD-10-CM | POA: Diagnosis not present

## 2015-03-27 DIAGNOSIS — J3089 Other allergic rhinitis: Secondary | ICD-10-CM | POA: Diagnosis not present

## 2015-04-03 DIAGNOSIS — J3089 Other allergic rhinitis: Secondary | ICD-10-CM | POA: Diagnosis not present

## 2015-04-03 DIAGNOSIS — J301 Allergic rhinitis due to pollen: Secondary | ICD-10-CM | POA: Diagnosis not present

## 2015-04-03 DIAGNOSIS — H6122 Impacted cerumen, left ear: Secondary | ICD-10-CM | POA: Diagnosis not present

## 2015-04-03 DIAGNOSIS — H903 Sensorineural hearing loss, bilateral: Secondary | ICD-10-CM | POA: Diagnosis not present

## 2015-04-03 DIAGNOSIS — Z974 Presence of external hearing-aid: Secondary | ICD-10-CM | POA: Diagnosis not present

## 2015-04-10 DIAGNOSIS — J301 Allergic rhinitis due to pollen: Secondary | ICD-10-CM | POA: Diagnosis not present

## 2015-04-10 DIAGNOSIS — J3089 Other allergic rhinitis: Secondary | ICD-10-CM | POA: Diagnosis not present

## 2015-04-17 DIAGNOSIS — J301 Allergic rhinitis due to pollen: Secondary | ICD-10-CM | POA: Diagnosis not present

## 2015-04-17 DIAGNOSIS — J3089 Other allergic rhinitis: Secondary | ICD-10-CM | POA: Diagnosis not present

## 2015-04-25 DIAGNOSIS — J3089 Other allergic rhinitis: Secondary | ICD-10-CM | POA: Diagnosis not present

## 2015-04-25 DIAGNOSIS — J301 Allergic rhinitis due to pollen: Secondary | ICD-10-CM | POA: Diagnosis not present

## 2015-05-03 DIAGNOSIS — J301 Allergic rhinitis due to pollen: Secondary | ICD-10-CM | POA: Diagnosis not present

## 2015-05-03 DIAGNOSIS — J3089 Other allergic rhinitis: Secondary | ICD-10-CM | POA: Diagnosis not present

## 2015-05-07 DIAGNOSIS — J301 Allergic rhinitis due to pollen: Secondary | ICD-10-CM | POA: Diagnosis not present

## 2015-05-07 DIAGNOSIS — J3089 Other allergic rhinitis: Secondary | ICD-10-CM | POA: Diagnosis not present

## 2015-05-09 DIAGNOSIS — E7800 Pure hypercholesterolemia, unspecified: Secondary | ICD-10-CM | POA: Diagnosis not present

## 2015-05-09 DIAGNOSIS — E119 Type 2 diabetes mellitus without complications: Secondary | ICD-10-CM | POA: Diagnosis not present

## 2015-05-09 DIAGNOSIS — N400 Enlarged prostate without lower urinary tract symptoms: Secondary | ICD-10-CM | POA: Diagnosis not present

## 2015-05-09 DIAGNOSIS — Z7984 Long term (current) use of oral hypoglycemic drugs: Secondary | ICD-10-CM | POA: Diagnosis not present

## 2015-05-09 DIAGNOSIS — J309 Allergic rhinitis, unspecified: Secondary | ICD-10-CM | POA: Diagnosis not present

## 2015-05-09 DIAGNOSIS — I10 Essential (primary) hypertension: Secondary | ICD-10-CM | POA: Diagnosis not present

## 2015-05-14 DIAGNOSIS — J3089 Other allergic rhinitis: Secondary | ICD-10-CM | POA: Diagnosis not present

## 2015-05-14 DIAGNOSIS — J301 Allergic rhinitis due to pollen: Secondary | ICD-10-CM | POA: Diagnosis not present

## 2015-05-17 DIAGNOSIS — Z86010 Personal history of colonic polyps: Secondary | ICD-10-CM | POA: Diagnosis not present

## 2015-05-17 DIAGNOSIS — K5900 Constipation, unspecified: Secondary | ICD-10-CM | POA: Diagnosis not present

## 2015-05-22 DIAGNOSIS — J3089 Other allergic rhinitis: Secondary | ICD-10-CM | POA: Diagnosis not present

## 2015-05-22 DIAGNOSIS — J301 Allergic rhinitis due to pollen: Secondary | ICD-10-CM | POA: Diagnosis not present

## 2015-05-28 DIAGNOSIS — J301 Allergic rhinitis due to pollen: Secondary | ICD-10-CM | POA: Diagnosis not present

## 2015-05-28 DIAGNOSIS — J3089 Other allergic rhinitis: Secondary | ICD-10-CM | POA: Diagnosis not present

## 2015-06-05 DIAGNOSIS — J301 Allergic rhinitis due to pollen: Secondary | ICD-10-CM | POA: Diagnosis not present

## 2015-06-05 DIAGNOSIS — J3089 Other allergic rhinitis: Secondary | ICD-10-CM | POA: Diagnosis not present

## 2015-06-13 DIAGNOSIS — J301 Allergic rhinitis due to pollen: Secondary | ICD-10-CM | POA: Diagnosis not present

## 2015-06-13 DIAGNOSIS — J3089 Other allergic rhinitis: Secondary | ICD-10-CM | POA: Diagnosis not present

## 2015-06-19 DIAGNOSIS — J3089 Other allergic rhinitis: Secondary | ICD-10-CM | POA: Diagnosis not present

## 2015-06-19 DIAGNOSIS — J301 Allergic rhinitis due to pollen: Secondary | ICD-10-CM | POA: Diagnosis not present

## 2015-06-25 DIAGNOSIS — J3089 Other allergic rhinitis: Secondary | ICD-10-CM | POA: Diagnosis not present

## 2015-06-25 DIAGNOSIS — J301 Allergic rhinitis due to pollen: Secondary | ICD-10-CM | POA: Diagnosis not present

## 2015-06-26 DIAGNOSIS — J301 Allergic rhinitis due to pollen: Secondary | ICD-10-CM | POA: Diagnosis not present

## 2015-06-26 DIAGNOSIS — J3089 Other allergic rhinitis: Secondary | ICD-10-CM | POA: Diagnosis not present

## 2015-06-27 DIAGNOSIS — K5900 Constipation, unspecified: Secondary | ICD-10-CM | POA: Diagnosis not present

## 2015-06-27 DIAGNOSIS — Z86010 Personal history of colonic polyps: Secondary | ICD-10-CM | POA: Diagnosis not present

## 2015-07-03 DIAGNOSIS — J3089 Other allergic rhinitis: Secondary | ICD-10-CM | POA: Diagnosis not present

## 2015-07-03 DIAGNOSIS — J301 Allergic rhinitis due to pollen: Secondary | ICD-10-CM | POA: Diagnosis not present

## 2015-07-04 DIAGNOSIS — Z85828 Personal history of other malignant neoplasm of skin: Secondary | ICD-10-CM | POA: Diagnosis not present

## 2015-07-04 DIAGNOSIS — L0889 Other specified local infections of the skin and subcutaneous tissue: Secondary | ICD-10-CM | POA: Diagnosis not present

## 2015-07-04 DIAGNOSIS — L309 Dermatitis, unspecified: Secondary | ICD-10-CM | POA: Diagnosis not present

## 2015-07-04 DIAGNOSIS — L304 Erythema intertrigo: Secondary | ICD-10-CM | POA: Diagnosis not present

## 2015-07-04 DIAGNOSIS — L0100 Impetigo, unspecified: Secondary | ICD-10-CM | POA: Diagnosis not present

## 2015-07-10 DIAGNOSIS — J301 Allergic rhinitis due to pollen: Secondary | ICD-10-CM | POA: Diagnosis not present

## 2015-07-10 DIAGNOSIS — J3089 Other allergic rhinitis: Secondary | ICD-10-CM | POA: Diagnosis not present

## 2015-07-17 DIAGNOSIS — J3089 Other allergic rhinitis: Secondary | ICD-10-CM | POA: Diagnosis not present

## 2015-07-17 DIAGNOSIS — J301 Allergic rhinitis due to pollen: Secondary | ICD-10-CM | POA: Diagnosis not present

## 2015-07-24 DIAGNOSIS — J3089 Other allergic rhinitis: Secondary | ICD-10-CM | POA: Diagnosis not present

## 2015-07-24 DIAGNOSIS — J301 Allergic rhinitis due to pollen: Secondary | ICD-10-CM | POA: Diagnosis not present

## 2015-07-26 DIAGNOSIS — J301 Allergic rhinitis due to pollen: Secondary | ICD-10-CM | POA: Diagnosis not present

## 2015-07-26 DIAGNOSIS — J3089 Other allergic rhinitis: Secondary | ICD-10-CM | POA: Diagnosis not present

## 2015-08-02 DIAGNOSIS — J3089 Other allergic rhinitis: Secondary | ICD-10-CM | POA: Diagnosis not present

## 2015-08-02 DIAGNOSIS — J301 Allergic rhinitis due to pollen: Secondary | ICD-10-CM | POA: Diagnosis not present

## 2015-08-07 DIAGNOSIS — J301 Allergic rhinitis due to pollen: Secondary | ICD-10-CM | POA: Diagnosis not present

## 2015-08-07 DIAGNOSIS — J3089 Other allergic rhinitis: Secondary | ICD-10-CM | POA: Diagnosis not present

## 2015-08-09 DIAGNOSIS — J3089 Other allergic rhinitis: Secondary | ICD-10-CM | POA: Diagnosis not present

## 2015-08-09 DIAGNOSIS — J301 Allergic rhinitis due to pollen: Secondary | ICD-10-CM | POA: Diagnosis not present

## 2015-08-14 DIAGNOSIS — J3089 Other allergic rhinitis: Secondary | ICD-10-CM | POA: Diagnosis not present

## 2015-08-14 DIAGNOSIS — J301 Allergic rhinitis due to pollen: Secondary | ICD-10-CM | POA: Diagnosis not present

## 2015-08-21 DIAGNOSIS — J3089 Other allergic rhinitis: Secondary | ICD-10-CM | POA: Diagnosis not present

## 2015-08-21 DIAGNOSIS — J301 Allergic rhinitis due to pollen: Secondary | ICD-10-CM | POA: Diagnosis not present

## 2015-08-28 DIAGNOSIS — J301 Allergic rhinitis due to pollen: Secondary | ICD-10-CM | POA: Diagnosis not present

## 2015-08-28 DIAGNOSIS — J3089 Other allergic rhinitis: Secondary | ICD-10-CM | POA: Diagnosis not present

## 2015-09-04 DIAGNOSIS — J3089 Other allergic rhinitis: Secondary | ICD-10-CM | POA: Diagnosis not present

## 2015-09-04 DIAGNOSIS — J301 Allergic rhinitis due to pollen: Secondary | ICD-10-CM | POA: Diagnosis not present

## 2015-09-11 DIAGNOSIS — J3089 Other allergic rhinitis: Secondary | ICD-10-CM | POA: Diagnosis not present

## 2015-09-11 DIAGNOSIS — J301 Allergic rhinitis due to pollen: Secondary | ICD-10-CM | POA: Diagnosis not present

## 2015-09-19 DIAGNOSIS — J301 Allergic rhinitis due to pollen: Secondary | ICD-10-CM | POA: Diagnosis not present

## 2015-09-19 DIAGNOSIS — J3089 Other allergic rhinitis: Secondary | ICD-10-CM | POA: Diagnosis not present

## 2015-09-20 DIAGNOSIS — Z86010 Personal history of colonic polyps: Secondary | ICD-10-CM | POA: Diagnosis not present

## 2015-09-20 DIAGNOSIS — K5730 Diverticulosis of large intestine without perforation or abscess without bleeding: Secondary | ICD-10-CM | POA: Diagnosis not present

## 2015-09-20 DIAGNOSIS — K5900 Constipation, unspecified: Secondary | ICD-10-CM | POA: Diagnosis not present

## 2015-09-25 DIAGNOSIS — J301 Allergic rhinitis due to pollen: Secondary | ICD-10-CM | POA: Diagnosis not present

## 2015-09-25 DIAGNOSIS — J3089 Other allergic rhinitis: Secondary | ICD-10-CM | POA: Diagnosis not present

## 2015-10-02 DIAGNOSIS — J301 Allergic rhinitis due to pollen: Secondary | ICD-10-CM | POA: Diagnosis not present

## 2015-10-02 DIAGNOSIS — J3089 Other allergic rhinitis: Secondary | ICD-10-CM | POA: Diagnosis not present

## 2015-10-09 DIAGNOSIS — J3089 Other allergic rhinitis: Secondary | ICD-10-CM | POA: Diagnosis not present

## 2015-10-09 DIAGNOSIS — J301 Allergic rhinitis due to pollen: Secondary | ICD-10-CM | POA: Diagnosis not present

## 2015-10-10 ENCOUNTER — Other Ambulatory Visit: Payer: Self-pay | Admitting: Gastroenterology

## 2015-10-10 DIAGNOSIS — D126 Benign neoplasm of colon, unspecified: Secondary | ICD-10-CM | POA: Diagnosis not present

## 2015-10-10 DIAGNOSIS — D123 Benign neoplasm of transverse colon: Secondary | ICD-10-CM | POA: Diagnosis not present

## 2015-10-10 DIAGNOSIS — Z86010 Personal history of colonic polyps: Secondary | ICD-10-CM | POA: Diagnosis not present

## 2015-10-10 DIAGNOSIS — D122 Benign neoplasm of ascending colon: Secondary | ICD-10-CM | POA: Diagnosis not present

## 2015-10-16 DIAGNOSIS — J3089 Other allergic rhinitis: Secondary | ICD-10-CM | POA: Diagnosis not present

## 2015-10-16 DIAGNOSIS — J301 Allergic rhinitis due to pollen: Secondary | ICD-10-CM | POA: Diagnosis not present

## 2015-10-17 DIAGNOSIS — H903 Sensorineural hearing loss, bilateral: Secondary | ICD-10-CM | POA: Diagnosis not present

## 2015-10-17 DIAGNOSIS — J302 Other seasonal allergic rhinitis: Secondary | ICD-10-CM | POA: Diagnosis not present

## 2015-10-17 DIAGNOSIS — Z974 Presence of external hearing-aid: Secondary | ICD-10-CM | POA: Diagnosis not present

## 2015-10-17 DIAGNOSIS — H6123 Impacted cerumen, bilateral: Secondary | ICD-10-CM | POA: Diagnosis not present

## 2015-10-24 DIAGNOSIS — J3089 Other allergic rhinitis: Secondary | ICD-10-CM | POA: Diagnosis not present

## 2015-10-24 DIAGNOSIS — J301 Allergic rhinitis due to pollen: Secondary | ICD-10-CM | POA: Diagnosis not present

## 2015-10-30 DIAGNOSIS — J3089 Other allergic rhinitis: Secondary | ICD-10-CM | POA: Diagnosis not present

## 2015-10-30 DIAGNOSIS — J301 Allergic rhinitis due to pollen: Secondary | ICD-10-CM | POA: Diagnosis not present

## 2015-11-06 DIAGNOSIS — N400 Enlarged prostate without lower urinary tract symptoms: Secondary | ICD-10-CM | POA: Diagnosis not present

## 2015-11-06 DIAGNOSIS — I10 Essential (primary) hypertension: Secondary | ICD-10-CM | POA: Diagnosis not present

## 2015-11-06 DIAGNOSIS — E7800 Pure hypercholesterolemia, unspecified: Secondary | ICD-10-CM | POA: Diagnosis not present

## 2015-11-06 DIAGNOSIS — J309 Allergic rhinitis, unspecified: Secondary | ICD-10-CM | POA: Diagnosis not present

## 2015-11-06 DIAGNOSIS — Z7984 Long term (current) use of oral hypoglycemic drugs: Secondary | ICD-10-CM | POA: Diagnosis not present

## 2015-11-06 DIAGNOSIS — J3089 Other allergic rhinitis: Secondary | ICD-10-CM | POA: Diagnosis not present

## 2015-11-06 DIAGNOSIS — E119 Type 2 diabetes mellitus without complications: Secondary | ICD-10-CM | POA: Diagnosis not present

## 2015-11-06 DIAGNOSIS — J301 Allergic rhinitis due to pollen: Secondary | ICD-10-CM | POA: Diagnosis not present

## 2015-11-12 DIAGNOSIS — Z85828 Personal history of other malignant neoplasm of skin: Secondary | ICD-10-CM | POA: Diagnosis not present

## 2015-11-12 DIAGNOSIS — J3089 Other allergic rhinitis: Secondary | ICD-10-CM | POA: Diagnosis not present

## 2015-11-12 DIAGNOSIS — D485 Neoplasm of uncertain behavior of skin: Secondary | ICD-10-CM | POA: Diagnosis not present

## 2015-11-12 DIAGNOSIS — D1801 Hemangioma of skin and subcutaneous tissue: Secondary | ICD-10-CM | POA: Diagnosis not present

## 2015-11-12 DIAGNOSIS — J301 Allergic rhinitis due to pollen: Secondary | ICD-10-CM | POA: Diagnosis not present

## 2015-11-12 DIAGNOSIS — L821 Other seborrheic keratosis: Secondary | ICD-10-CM | POA: Diagnosis not present

## 2015-11-12 DIAGNOSIS — L570 Actinic keratosis: Secondary | ICD-10-CM | POA: Diagnosis not present

## 2015-11-12 DIAGNOSIS — L565 Disseminated superficial actinic porokeratosis (DSAP): Secondary | ICD-10-CM | POA: Diagnosis not present

## 2015-11-14 DIAGNOSIS — J3089 Other allergic rhinitis: Secondary | ICD-10-CM | POA: Diagnosis not present

## 2015-11-14 DIAGNOSIS — J301 Allergic rhinitis due to pollen: Secondary | ICD-10-CM | POA: Diagnosis not present

## 2015-11-20 DIAGNOSIS — J3089 Other allergic rhinitis: Secondary | ICD-10-CM | POA: Diagnosis not present

## 2015-11-20 DIAGNOSIS — J301 Allergic rhinitis due to pollen: Secondary | ICD-10-CM | POA: Diagnosis not present

## 2015-11-27 DIAGNOSIS — J3089 Other allergic rhinitis: Secondary | ICD-10-CM | POA: Diagnosis not present

## 2015-11-27 DIAGNOSIS — J301 Allergic rhinitis due to pollen: Secondary | ICD-10-CM | POA: Diagnosis not present

## 2015-12-04 DIAGNOSIS — J3089 Other allergic rhinitis: Secondary | ICD-10-CM | POA: Diagnosis not present

## 2015-12-04 DIAGNOSIS — J301 Allergic rhinitis due to pollen: Secondary | ICD-10-CM | POA: Diagnosis not present

## 2015-12-06 DIAGNOSIS — J301 Allergic rhinitis due to pollen: Secondary | ICD-10-CM | POA: Diagnosis not present

## 2015-12-06 DIAGNOSIS — J3089 Other allergic rhinitis: Secondary | ICD-10-CM | POA: Diagnosis not present

## 2015-12-10 DIAGNOSIS — J301 Allergic rhinitis due to pollen: Secondary | ICD-10-CM | POA: Diagnosis not present

## 2015-12-10 DIAGNOSIS — J3089 Other allergic rhinitis: Secondary | ICD-10-CM | POA: Diagnosis not present

## 2015-12-12 DIAGNOSIS — J301 Allergic rhinitis due to pollen: Secondary | ICD-10-CM | POA: Diagnosis not present

## 2015-12-12 DIAGNOSIS — J3089 Other allergic rhinitis: Secondary | ICD-10-CM | POA: Diagnosis not present

## 2015-12-18 DIAGNOSIS — J3089 Other allergic rhinitis: Secondary | ICD-10-CM | POA: Diagnosis not present

## 2015-12-18 DIAGNOSIS — J301 Allergic rhinitis due to pollen: Secondary | ICD-10-CM | POA: Diagnosis not present

## 2015-12-25 DIAGNOSIS — E119 Type 2 diabetes mellitus without complications: Secondary | ICD-10-CM | POA: Diagnosis not present

## 2015-12-25 DIAGNOSIS — J3089 Other allergic rhinitis: Secondary | ICD-10-CM | POA: Diagnosis not present

## 2015-12-25 DIAGNOSIS — J301 Allergic rhinitis due to pollen: Secondary | ICD-10-CM | POA: Diagnosis not present

## 2015-12-25 DIAGNOSIS — H524 Presbyopia: Secondary | ICD-10-CM | POA: Diagnosis not present

## 2015-12-25 DIAGNOSIS — Z961 Presence of intraocular lens: Secondary | ICD-10-CM | POA: Diagnosis not present

## 2016-01-01 DIAGNOSIS — J301 Allergic rhinitis due to pollen: Secondary | ICD-10-CM | POA: Diagnosis not present

## 2016-01-01 DIAGNOSIS — J3089 Other allergic rhinitis: Secondary | ICD-10-CM | POA: Diagnosis not present

## 2016-01-07 DIAGNOSIS — J301 Allergic rhinitis due to pollen: Secondary | ICD-10-CM | POA: Diagnosis not present

## 2016-01-07 DIAGNOSIS — J3089 Other allergic rhinitis: Secondary | ICD-10-CM | POA: Diagnosis not present

## 2016-01-14 DIAGNOSIS — J301 Allergic rhinitis due to pollen: Secondary | ICD-10-CM | POA: Diagnosis not present

## 2016-01-14 DIAGNOSIS — J3089 Other allergic rhinitis: Secondary | ICD-10-CM | POA: Diagnosis not present

## 2016-01-22 DIAGNOSIS — J3089 Other allergic rhinitis: Secondary | ICD-10-CM | POA: Diagnosis not present

## 2016-01-22 DIAGNOSIS — J301 Allergic rhinitis due to pollen: Secondary | ICD-10-CM | POA: Diagnosis not present

## 2016-01-29 DIAGNOSIS — J301 Allergic rhinitis due to pollen: Secondary | ICD-10-CM | POA: Diagnosis not present

## 2016-01-29 DIAGNOSIS — J3089 Other allergic rhinitis: Secondary | ICD-10-CM | POA: Diagnosis not present

## 2016-02-05 DIAGNOSIS — J3089 Other allergic rhinitis: Secondary | ICD-10-CM | POA: Diagnosis not present

## 2016-02-05 DIAGNOSIS — J301 Allergic rhinitis due to pollen: Secondary | ICD-10-CM | POA: Diagnosis not present

## 2016-02-12 DIAGNOSIS — J3089 Other allergic rhinitis: Secondary | ICD-10-CM | POA: Diagnosis not present

## 2016-02-12 DIAGNOSIS — J301 Allergic rhinitis due to pollen: Secondary | ICD-10-CM | POA: Diagnosis not present

## 2016-02-15 DIAGNOSIS — Z23 Encounter for immunization: Secondary | ICD-10-CM | POA: Diagnosis not present

## 2016-02-18 DIAGNOSIS — J301 Allergic rhinitis due to pollen: Secondary | ICD-10-CM | POA: Diagnosis not present

## 2016-02-18 DIAGNOSIS — J3089 Other allergic rhinitis: Secondary | ICD-10-CM | POA: Diagnosis not present

## 2016-02-19 DIAGNOSIS — J301 Allergic rhinitis due to pollen: Secondary | ICD-10-CM | POA: Diagnosis not present

## 2016-02-19 DIAGNOSIS — J3089 Other allergic rhinitis: Secondary | ICD-10-CM | POA: Diagnosis not present

## 2016-02-26 DIAGNOSIS — J3089 Other allergic rhinitis: Secondary | ICD-10-CM | POA: Diagnosis not present

## 2016-02-26 DIAGNOSIS — J301 Allergic rhinitis due to pollen: Secondary | ICD-10-CM | POA: Diagnosis not present

## 2016-03-04 DIAGNOSIS — J301 Allergic rhinitis due to pollen: Secondary | ICD-10-CM | POA: Diagnosis not present

## 2016-03-04 DIAGNOSIS — J3089 Other allergic rhinitis: Secondary | ICD-10-CM | POA: Diagnosis not present

## 2016-03-11 DIAGNOSIS — J301 Allergic rhinitis due to pollen: Secondary | ICD-10-CM | POA: Diagnosis not present

## 2016-03-11 DIAGNOSIS — J3089 Other allergic rhinitis: Secondary | ICD-10-CM | POA: Diagnosis not present

## 2016-03-18 DIAGNOSIS — J301 Allergic rhinitis due to pollen: Secondary | ICD-10-CM | POA: Diagnosis not present

## 2016-03-18 DIAGNOSIS — J3089 Other allergic rhinitis: Secondary | ICD-10-CM | POA: Diagnosis not present

## 2016-03-25 DIAGNOSIS — J3089 Other allergic rhinitis: Secondary | ICD-10-CM | POA: Diagnosis not present

## 2016-03-25 DIAGNOSIS — J301 Allergic rhinitis due to pollen: Secondary | ICD-10-CM | POA: Diagnosis not present

## 2016-04-01 DIAGNOSIS — J3089 Other allergic rhinitis: Secondary | ICD-10-CM | POA: Diagnosis not present

## 2016-04-01 DIAGNOSIS — J301 Allergic rhinitis due to pollen: Secondary | ICD-10-CM | POA: Diagnosis not present

## 2016-04-08 DIAGNOSIS — J3089 Other allergic rhinitis: Secondary | ICD-10-CM | POA: Diagnosis not present

## 2016-04-08 DIAGNOSIS — J301 Allergic rhinitis due to pollen: Secondary | ICD-10-CM | POA: Diagnosis not present

## 2016-04-09 DIAGNOSIS — H903 Sensorineural hearing loss, bilateral: Secondary | ICD-10-CM | POA: Diagnosis not present

## 2016-04-09 DIAGNOSIS — Z974 Presence of external hearing-aid: Secondary | ICD-10-CM | POA: Diagnosis not present

## 2016-04-09 DIAGNOSIS — H6122 Impacted cerumen, left ear: Secondary | ICD-10-CM | POA: Diagnosis not present

## 2016-04-15 DIAGNOSIS — J301 Allergic rhinitis due to pollen: Secondary | ICD-10-CM | POA: Diagnosis not present

## 2016-04-15 DIAGNOSIS — J3089 Other allergic rhinitis: Secondary | ICD-10-CM | POA: Diagnosis not present

## 2016-04-23 DIAGNOSIS — J301 Allergic rhinitis due to pollen: Secondary | ICD-10-CM | POA: Diagnosis not present

## 2016-04-23 DIAGNOSIS — J3089 Other allergic rhinitis: Secondary | ICD-10-CM | POA: Diagnosis not present

## 2016-04-25 DIAGNOSIS — J301 Allergic rhinitis due to pollen: Secondary | ICD-10-CM | POA: Diagnosis not present

## 2016-04-25 DIAGNOSIS — J3089 Other allergic rhinitis: Secondary | ICD-10-CM | POA: Diagnosis not present

## 2016-04-28 DIAGNOSIS — J301 Allergic rhinitis due to pollen: Secondary | ICD-10-CM | POA: Diagnosis not present

## 2016-04-28 DIAGNOSIS — J3089 Other allergic rhinitis: Secondary | ICD-10-CM | POA: Diagnosis not present

## 2016-04-30 DIAGNOSIS — J301 Allergic rhinitis due to pollen: Secondary | ICD-10-CM | POA: Diagnosis not present

## 2016-04-30 DIAGNOSIS — J3089 Other allergic rhinitis: Secondary | ICD-10-CM | POA: Diagnosis not present

## 2016-05-06 DIAGNOSIS — J301 Allergic rhinitis due to pollen: Secondary | ICD-10-CM | POA: Diagnosis not present

## 2016-05-06 DIAGNOSIS — J3089 Other allergic rhinitis: Secondary | ICD-10-CM | POA: Diagnosis not present

## 2016-05-13 DIAGNOSIS — J301 Allergic rhinitis due to pollen: Secondary | ICD-10-CM | POA: Diagnosis not present

## 2016-05-13 DIAGNOSIS — J3089 Other allergic rhinitis: Secondary | ICD-10-CM | POA: Diagnosis not present

## 2016-05-14 DIAGNOSIS — D1801 Hemangioma of skin and subcutaneous tissue: Secondary | ICD-10-CM | POA: Diagnosis not present

## 2016-05-14 DIAGNOSIS — D692 Other nonthrombocytopenic purpura: Secondary | ICD-10-CM | POA: Diagnosis not present

## 2016-05-14 DIAGNOSIS — L565 Disseminated superficial actinic porokeratosis (DSAP): Secondary | ICD-10-CM | POA: Diagnosis not present

## 2016-05-14 DIAGNOSIS — L218 Other seborrheic dermatitis: Secondary | ICD-10-CM | POA: Diagnosis not present

## 2016-05-14 DIAGNOSIS — L821 Other seborrheic keratosis: Secondary | ICD-10-CM | POA: Diagnosis not present

## 2016-05-14 DIAGNOSIS — L570 Actinic keratosis: Secondary | ICD-10-CM | POA: Diagnosis not present

## 2016-05-14 DIAGNOSIS — Z85828 Personal history of other malignant neoplasm of skin: Secondary | ICD-10-CM | POA: Diagnosis not present

## 2016-05-20 DIAGNOSIS — J3089 Other allergic rhinitis: Secondary | ICD-10-CM | POA: Diagnosis not present

## 2016-05-20 DIAGNOSIS — J301 Allergic rhinitis due to pollen: Secondary | ICD-10-CM | POA: Diagnosis not present

## 2016-05-21 DIAGNOSIS — E119 Type 2 diabetes mellitus without complications: Secondary | ICD-10-CM | POA: Diagnosis not present

## 2016-05-21 DIAGNOSIS — N400 Enlarged prostate without lower urinary tract symptoms: Secondary | ICD-10-CM | POA: Diagnosis not present

## 2016-05-21 DIAGNOSIS — I10 Essential (primary) hypertension: Secondary | ICD-10-CM | POA: Diagnosis not present

## 2016-05-21 DIAGNOSIS — J309 Allergic rhinitis, unspecified: Secondary | ICD-10-CM | POA: Diagnosis not present

## 2016-05-21 DIAGNOSIS — E7800 Pure hypercholesterolemia, unspecified: Secondary | ICD-10-CM | POA: Diagnosis not present

## 2016-05-27 DIAGNOSIS — J301 Allergic rhinitis due to pollen: Secondary | ICD-10-CM | POA: Diagnosis not present

## 2016-05-27 DIAGNOSIS — J3089 Other allergic rhinitis: Secondary | ICD-10-CM | POA: Diagnosis not present

## 2016-06-03 DIAGNOSIS — J301 Allergic rhinitis due to pollen: Secondary | ICD-10-CM | POA: Diagnosis not present

## 2016-06-03 DIAGNOSIS — J3089 Other allergic rhinitis: Secondary | ICD-10-CM | POA: Diagnosis not present

## 2016-06-10 DIAGNOSIS — J301 Allergic rhinitis due to pollen: Secondary | ICD-10-CM | POA: Diagnosis not present

## 2016-06-10 DIAGNOSIS — J3089 Other allergic rhinitis: Secondary | ICD-10-CM | POA: Diagnosis not present

## 2016-06-17 DIAGNOSIS — J3089 Other allergic rhinitis: Secondary | ICD-10-CM | POA: Diagnosis not present

## 2016-06-17 DIAGNOSIS — J301 Allergic rhinitis due to pollen: Secondary | ICD-10-CM | POA: Diagnosis not present

## 2016-06-24 DIAGNOSIS — J301 Allergic rhinitis due to pollen: Secondary | ICD-10-CM | POA: Diagnosis not present

## 2016-06-24 DIAGNOSIS — J3089 Other allergic rhinitis: Secondary | ICD-10-CM | POA: Diagnosis not present

## 2016-07-01 DIAGNOSIS — J3089 Other allergic rhinitis: Secondary | ICD-10-CM | POA: Diagnosis not present

## 2016-07-01 DIAGNOSIS — J301 Allergic rhinitis due to pollen: Secondary | ICD-10-CM | POA: Diagnosis not present

## 2016-07-08 DIAGNOSIS — J301 Allergic rhinitis due to pollen: Secondary | ICD-10-CM | POA: Diagnosis not present

## 2016-07-08 DIAGNOSIS — J3089 Other allergic rhinitis: Secondary | ICD-10-CM | POA: Diagnosis not present

## 2016-07-16 DIAGNOSIS — J301 Allergic rhinitis due to pollen: Secondary | ICD-10-CM | POA: Diagnosis not present

## 2016-07-16 DIAGNOSIS — J3089 Other allergic rhinitis: Secondary | ICD-10-CM | POA: Diagnosis not present

## 2016-07-22 DIAGNOSIS — J301 Allergic rhinitis due to pollen: Secondary | ICD-10-CM | POA: Diagnosis not present

## 2016-07-22 DIAGNOSIS — J3089 Other allergic rhinitis: Secondary | ICD-10-CM | POA: Diagnosis not present

## 2016-07-29 DIAGNOSIS — J301 Allergic rhinitis due to pollen: Secondary | ICD-10-CM | POA: Diagnosis not present

## 2016-07-29 DIAGNOSIS — J3089 Other allergic rhinitis: Secondary | ICD-10-CM | POA: Diagnosis not present

## 2016-08-05 DIAGNOSIS — J301 Allergic rhinitis due to pollen: Secondary | ICD-10-CM | POA: Diagnosis not present

## 2016-08-05 DIAGNOSIS — J3089 Other allergic rhinitis: Secondary | ICD-10-CM | POA: Diagnosis not present

## 2016-08-12 DIAGNOSIS — J3089 Other allergic rhinitis: Secondary | ICD-10-CM | POA: Diagnosis not present

## 2016-08-12 DIAGNOSIS — J301 Allergic rhinitis due to pollen: Secondary | ICD-10-CM | POA: Diagnosis not present

## 2016-08-19 DIAGNOSIS — J301 Allergic rhinitis due to pollen: Secondary | ICD-10-CM | POA: Diagnosis not present

## 2016-08-19 DIAGNOSIS — J3089 Other allergic rhinitis: Secondary | ICD-10-CM | POA: Diagnosis not present

## 2016-08-21 DIAGNOSIS — J301 Allergic rhinitis due to pollen: Secondary | ICD-10-CM | POA: Diagnosis not present

## 2016-08-21 DIAGNOSIS — J3089 Other allergic rhinitis: Secondary | ICD-10-CM | POA: Diagnosis not present

## 2016-08-26 DIAGNOSIS — J3089 Other allergic rhinitis: Secondary | ICD-10-CM | POA: Diagnosis not present

## 2016-08-26 DIAGNOSIS — J301 Allergic rhinitis due to pollen: Secondary | ICD-10-CM | POA: Diagnosis not present

## 2016-08-27 DIAGNOSIS — L98419 Non-pressure chronic ulcer of buttock with unspecified severity: Secondary | ICD-10-CM | POA: Diagnosis not present

## 2016-09-02 DIAGNOSIS — J3089 Other allergic rhinitis: Secondary | ICD-10-CM | POA: Diagnosis not present

## 2016-09-02 DIAGNOSIS — J301 Allergic rhinitis due to pollen: Secondary | ICD-10-CM | POA: Diagnosis not present

## 2016-09-09 DIAGNOSIS — J301 Allergic rhinitis due to pollen: Secondary | ICD-10-CM | POA: Diagnosis not present

## 2016-09-09 DIAGNOSIS — J3089 Other allergic rhinitis: Secondary | ICD-10-CM | POA: Diagnosis not present

## 2016-09-16 DIAGNOSIS — J301 Allergic rhinitis due to pollen: Secondary | ICD-10-CM | POA: Diagnosis not present

## 2016-09-16 DIAGNOSIS — J3089 Other allergic rhinitis: Secondary | ICD-10-CM | POA: Diagnosis not present

## 2016-09-18 DIAGNOSIS — J3089 Other allergic rhinitis: Secondary | ICD-10-CM | POA: Diagnosis not present

## 2016-09-18 DIAGNOSIS — J301 Allergic rhinitis due to pollen: Secondary | ICD-10-CM | POA: Diagnosis not present

## 2016-09-22 DIAGNOSIS — J3089 Other allergic rhinitis: Secondary | ICD-10-CM | POA: Diagnosis not present

## 2016-09-22 DIAGNOSIS — J301 Allergic rhinitis due to pollen: Secondary | ICD-10-CM | POA: Diagnosis not present

## 2016-09-24 DIAGNOSIS — J3089 Other allergic rhinitis: Secondary | ICD-10-CM | POA: Diagnosis not present

## 2016-09-24 DIAGNOSIS — J301 Allergic rhinitis due to pollen: Secondary | ICD-10-CM | POA: Diagnosis not present

## 2016-09-30 DIAGNOSIS — J301 Allergic rhinitis due to pollen: Secondary | ICD-10-CM | POA: Diagnosis not present

## 2016-09-30 DIAGNOSIS — J3089 Other allergic rhinitis: Secondary | ICD-10-CM | POA: Diagnosis not present

## 2016-10-07 DIAGNOSIS — J301 Allergic rhinitis due to pollen: Secondary | ICD-10-CM | POA: Diagnosis not present

## 2016-10-07 DIAGNOSIS — J3089 Other allergic rhinitis: Secondary | ICD-10-CM | POA: Diagnosis not present

## 2016-10-07 DIAGNOSIS — J3081 Allergic rhinitis due to animal (cat) (dog) hair and dander: Secondary | ICD-10-CM | POA: Diagnosis not present

## 2016-10-08 DIAGNOSIS — Z974 Presence of external hearing-aid: Secondary | ICD-10-CM | POA: Diagnosis not present

## 2016-10-08 DIAGNOSIS — H903 Sensorineural hearing loss, bilateral: Secondary | ICD-10-CM | POA: Diagnosis not present

## 2016-10-08 DIAGNOSIS — H6122 Impacted cerumen, left ear: Secondary | ICD-10-CM | POA: Diagnosis not present

## 2016-10-14 DIAGNOSIS — J3089 Other allergic rhinitis: Secondary | ICD-10-CM | POA: Diagnosis not present

## 2016-10-14 DIAGNOSIS — J301 Allergic rhinitis due to pollen: Secondary | ICD-10-CM | POA: Diagnosis not present

## 2016-10-21 DIAGNOSIS — J3089 Other allergic rhinitis: Secondary | ICD-10-CM | POA: Diagnosis not present

## 2016-10-21 DIAGNOSIS — J301 Allergic rhinitis due to pollen: Secondary | ICD-10-CM | POA: Diagnosis not present

## 2016-10-28 DIAGNOSIS — J301 Allergic rhinitis due to pollen: Secondary | ICD-10-CM | POA: Diagnosis not present

## 2016-10-28 DIAGNOSIS — J3089 Other allergic rhinitis: Secondary | ICD-10-CM | POA: Diagnosis not present

## 2016-11-04 DIAGNOSIS — J3089 Other allergic rhinitis: Secondary | ICD-10-CM | POA: Diagnosis not present

## 2016-11-04 DIAGNOSIS — J301 Allergic rhinitis due to pollen: Secondary | ICD-10-CM | POA: Diagnosis not present

## 2016-11-11 DIAGNOSIS — D1801 Hemangioma of skin and subcutaneous tissue: Secondary | ICD-10-CM | POA: Diagnosis not present

## 2016-11-11 DIAGNOSIS — J3089 Other allergic rhinitis: Secondary | ICD-10-CM | POA: Diagnosis not present

## 2016-11-11 DIAGNOSIS — D485 Neoplasm of uncertain behavior of skin: Secondary | ICD-10-CM | POA: Diagnosis not present

## 2016-11-11 DIAGNOSIS — L565 Disseminated superficial actinic porokeratosis (DSAP): Secondary | ICD-10-CM | POA: Diagnosis not present

## 2016-11-11 DIAGNOSIS — L821 Other seborrheic keratosis: Secondary | ICD-10-CM | POA: Diagnosis not present

## 2016-11-11 DIAGNOSIS — J301 Allergic rhinitis due to pollen: Secondary | ICD-10-CM | POA: Diagnosis not present

## 2016-11-11 DIAGNOSIS — L820 Inflamed seborrheic keratosis: Secondary | ICD-10-CM | POA: Diagnosis not present

## 2016-11-11 DIAGNOSIS — Z85828 Personal history of other malignant neoplasm of skin: Secondary | ICD-10-CM | POA: Diagnosis not present

## 2016-11-11 DIAGNOSIS — L570 Actinic keratosis: Secondary | ICD-10-CM | POA: Diagnosis not present

## 2016-11-17 DIAGNOSIS — J301 Allergic rhinitis due to pollen: Secondary | ICD-10-CM | POA: Diagnosis not present

## 2016-11-17 DIAGNOSIS — J3089 Other allergic rhinitis: Secondary | ICD-10-CM | POA: Diagnosis not present

## 2016-11-18 DIAGNOSIS — E7800 Pure hypercholesterolemia, unspecified: Secondary | ICD-10-CM | POA: Diagnosis not present

## 2016-11-18 DIAGNOSIS — E1165 Type 2 diabetes mellitus with hyperglycemia: Secondary | ICD-10-CM | POA: Diagnosis not present

## 2016-11-18 DIAGNOSIS — Z7984 Long term (current) use of oral hypoglycemic drugs: Secondary | ICD-10-CM | POA: Diagnosis not present

## 2016-11-18 DIAGNOSIS — J309 Allergic rhinitis, unspecified: Secondary | ICD-10-CM | POA: Diagnosis not present

## 2016-11-18 DIAGNOSIS — N400 Enlarged prostate without lower urinary tract symptoms: Secondary | ICD-10-CM | POA: Diagnosis not present

## 2016-11-18 DIAGNOSIS — K5900 Constipation, unspecified: Secondary | ICD-10-CM | POA: Diagnosis not present

## 2016-11-18 DIAGNOSIS — I10 Essential (primary) hypertension: Secondary | ICD-10-CM | POA: Diagnosis not present

## 2016-11-25 DIAGNOSIS — J301 Allergic rhinitis due to pollen: Secondary | ICD-10-CM | POA: Diagnosis not present

## 2016-11-25 DIAGNOSIS — J3089 Other allergic rhinitis: Secondary | ICD-10-CM | POA: Diagnosis not present

## 2016-12-01 DIAGNOSIS — J301 Allergic rhinitis due to pollen: Secondary | ICD-10-CM | POA: Diagnosis not present

## 2016-12-01 DIAGNOSIS — J3089 Other allergic rhinitis: Secondary | ICD-10-CM | POA: Diagnosis not present

## 2016-12-08 DIAGNOSIS — J3089 Other allergic rhinitis: Secondary | ICD-10-CM | POA: Diagnosis not present

## 2016-12-08 DIAGNOSIS — J301 Allergic rhinitis due to pollen: Secondary | ICD-10-CM | POA: Diagnosis not present

## 2016-12-16 DIAGNOSIS — J301 Allergic rhinitis due to pollen: Secondary | ICD-10-CM | POA: Diagnosis not present

## 2016-12-16 DIAGNOSIS — J3089 Other allergic rhinitis: Secondary | ICD-10-CM | POA: Diagnosis not present

## 2016-12-23 DIAGNOSIS — J3089 Other allergic rhinitis: Secondary | ICD-10-CM | POA: Diagnosis not present

## 2016-12-23 DIAGNOSIS — J301 Allergic rhinitis due to pollen: Secondary | ICD-10-CM | POA: Diagnosis not present

## 2016-12-25 DIAGNOSIS — H5211 Myopia, right eye: Secondary | ICD-10-CM | POA: Diagnosis not present

## 2016-12-25 DIAGNOSIS — E119 Type 2 diabetes mellitus without complications: Secondary | ICD-10-CM | POA: Diagnosis not present

## 2016-12-25 DIAGNOSIS — Z961 Presence of intraocular lens: Secondary | ICD-10-CM | POA: Diagnosis not present

## 2016-12-25 DIAGNOSIS — H35373 Puckering of macula, bilateral: Secondary | ICD-10-CM | POA: Diagnosis not present

## 2016-12-30 DIAGNOSIS — J301 Allergic rhinitis due to pollen: Secondary | ICD-10-CM | POA: Diagnosis not present

## 2016-12-30 DIAGNOSIS — J3089 Other allergic rhinitis: Secondary | ICD-10-CM | POA: Diagnosis not present

## 2017-01-06 DIAGNOSIS — J3089 Other allergic rhinitis: Secondary | ICD-10-CM | POA: Diagnosis not present

## 2017-01-06 DIAGNOSIS — J301 Allergic rhinitis due to pollen: Secondary | ICD-10-CM | POA: Diagnosis not present

## 2017-01-13 DIAGNOSIS — J3089 Other allergic rhinitis: Secondary | ICD-10-CM | POA: Diagnosis not present

## 2017-01-13 DIAGNOSIS — J301 Allergic rhinitis due to pollen: Secondary | ICD-10-CM | POA: Diagnosis not present

## 2017-01-16 DIAGNOSIS — J301 Allergic rhinitis due to pollen: Secondary | ICD-10-CM | POA: Diagnosis not present

## 2017-01-16 DIAGNOSIS — J3081 Allergic rhinitis due to animal (cat) (dog) hair and dander: Secondary | ICD-10-CM | POA: Diagnosis not present

## 2017-01-20 DIAGNOSIS — J3089 Other allergic rhinitis: Secondary | ICD-10-CM | POA: Diagnosis not present

## 2017-01-20 DIAGNOSIS — J301 Allergic rhinitis due to pollen: Secondary | ICD-10-CM | POA: Diagnosis not present

## 2017-01-27 DIAGNOSIS — J301 Allergic rhinitis due to pollen: Secondary | ICD-10-CM | POA: Diagnosis not present

## 2017-01-27 DIAGNOSIS — J3089 Other allergic rhinitis: Secondary | ICD-10-CM | POA: Diagnosis not present

## 2017-02-03 DIAGNOSIS — J3089 Other allergic rhinitis: Secondary | ICD-10-CM | POA: Diagnosis not present

## 2017-02-03 DIAGNOSIS — J301 Allergic rhinitis due to pollen: Secondary | ICD-10-CM | POA: Diagnosis not present

## 2017-02-04 DIAGNOSIS — K5900 Constipation, unspecified: Secondary | ICD-10-CM | POA: Diagnosis not present

## 2017-02-09 DIAGNOSIS — J3089 Other allergic rhinitis: Secondary | ICD-10-CM | POA: Diagnosis not present

## 2017-02-09 DIAGNOSIS — J301 Allergic rhinitis due to pollen: Secondary | ICD-10-CM | POA: Diagnosis not present

## 2017-02-11 DIAGNOSIS — J301 Allergic rhinitis due to pollen: Secondary | ICD-10-CM | POA: Diagnosis not present

## 2017-02-11 DIAGNOSIS — J3089 Other allergic rhinitis: Secondary | ICD-10-CM | POA: Diagnosis not present

## 2017-02-16 DIAGNOSIS — J301 Allergic rhinitis due to pollen: Secondary | ICD-10-CM | POA: Diagnosis not present

## 2017-02-16 DIAGNOSIS — J3089 Other allergic rhinitis: Secondary | ICD-10-CM | POA: Diagnosis not present

## 2017-02-17 DIAGNOSIS — H1045 Other chronic allergic conjunctivitis: Secondary | ICD-10-CM | POA: Diagnosis not present

## 2017-02-17 DIAGNOSIS — J301 Allergic rhinitis due to pollen: Secondary | ICD-10-CM | POA: Diagnosis not present

## 2017-02-17 DIAGNOSIS — J3089 Other allergic rhinitis: Secondary | ICD-10-CM | POA: Diagnosis not present

## 2017-02-18 DIAGNOSIS — J3089 Other allergic rhinitis: Secondary | ICD-10-CM | POA: Diagnosis not present

## 2017-02-18 DIAGNOSIS — J301 Allergic rhinitis due to pollen: Secondary | ICD-10-CM | POA: Diagnosis not present

## 2017-02-23 DIAGNOSIS — J3089 Other allergic rhinitis: Secondary | ICD-10-CM | POA: Diagnosis not present

## 2017-02-23 DIAGNOSIS — J301 Allergic rhinitis due to pollen: Secondary | ICD-10-CM | POA: Diagnosis not present

## 2017-03-03 DIAGNOSIS — J301 Allergic rhinitis due to pollen: Secondary | ICD-10-CM | POA: Diagnosis not present

## 2017-03-03 DIAGNOSIS — J3089 Other allergic rhinitis: Secondary | ICD-10-CM | POA: Diagnosis not present

## 2017-03-09 DIAGNOSIS — J301 Allergic rhinitis due to pollen: Secondary | ICD-10-CM | POA: Diagnosis not present

## 2017-03-09 DIAGNOSIS — J3089 Other allergic rhinitis: Secondary | ICD-10-CM | POA: Diagnosis not present

## 2017-03-17 DIAGNOSIS — J3089 Other allergic rhinitis: Secondary | ICD-10-CM | POA: Diagnosis not present

## 2017-03-17 DIAGNOSIS — J301 Allergic rhinitis due to pollen: Secondary | ICD-10-CM | POA: Diagnosis not present

## 2017-03-23 DIAGNOSIS — J3089 Other allergic rhinitis: Secondary | ICD-10-CM | POA: Diagnosis not present

## 2017-03-23 DIAGNOSIS — J301 Allergic rhinitis due to pollen: Secondary | ICD-10-CM | POA: Diagnosis not present

## 2017-04-03 DIAGNOSIS — J301 Allergic rhinitis due to pollen: Secondary | ICD-10-CM | POA: Diagnosis not present

## 2017-04-03 DIAGNOSIS — J3089 Other allergic rhinitis: Secondary | ICD-10-CM | POA: Diagnosis not present

## 2017-04-08 DIAGNOSIS — H9193 Unspecified hearing loss, bilateral: Secondary | ICD-10-CM | POA: Diagnosis not present

## 2017-04-08 DIAGNOSIS — J301 Allergic rhinitis due to pollen: Secondary | ICD-10-CM | POA: Diagnosis not present

## 2017-04-08 DIAGNOSIS — J3089 Other allergic rhinitis: Secondary | ICD-10-CM | POA: Diagnosis not present

## 2017-04-08 DIAGNOSIS — H6123 Impacted cerumen, bilateral: Secondary | ICD-10-CM | POA: Diagnosis not present

## 2017-04-08 DIAGNOSIS — R0981 Nasal congestion: Secondary | ICD-10-CM | POA: Diagnosis not present

## 2017-04-15 DIAGNOSIS — J9801 Acute bronchospasm: Secondary | ICD-10-CM | POA: Diagnosis not present

## 2017-04-15 DIAGNOSIS — R05 Cough: Secondary | ICD-10-CM | POA: Diagnosis not present

## 2017-04-22 DIAGNOSIS — J3089 Other allergic rhinitis: Secondary | ICD-10-CM | POA: Diagnosis not present

## 2017-04-22 DIAGNOSIS — J301 Allergic rhinitis due to pollen: Secondary | ICD-10-CM | POA: Diagnosis not present

## 2017-04-27 DIAGNOSIS — J301 Allergic rhinitis due to pollen: Secondary | ICD-10-CM | POA: Diagnosis not present

## 2017-04-27 DIAGNOSIS — J3089 Other allergic rhinitis: Secondary | ICD-10-CM | POA: Diagnosis not present

## 2017-05-06 DIAGNOSIS — J3089 Other allergic rhinitis: Secondary | ICD-10-CM | POA: Diagnosis not present

## 2017-05-06 DIAGNOSIS — J301 Allergic rhinitis due to pollen: Secondary | ICD-10-CM | POA: Diagnosis not present

## 2017-05-13 DIAGNOSIS — J301 Allergic rhinitis due to pollen: Secondary | ICD-10-CM | POA: Diagnosis not present

## 2017-05-13 DIAGNOSIS — J3089 Other allergic rhinitis: Secondary | ICD-10-CM | POA: Diagnosis not present

## 2017-05-19 DIAGNOSIS — J3089 Other allergic rhinitis: Secondary | ICD-10-CM | POA: Diagnosis not present

## 2017-05-19 DIAGNOSIS — J301 Allergic rhinitis due to pollen: Secondary | ICD-10-CM | POA: Diagnosis not present

## 2017-05-26 DIAGNOSIS — J3089 Other allergic rhinitis: Secondary | ICD-10-CM | POA: Diagnosis not present

## 2017-05-26 DIAGNOSIS — J301 Allergic rhinitis due to pollen: Secondary | ICD-10-CM | POA: Diagnosis not present

## 2017-05-27 DIAGNOSIS — Z7984 Long term (current) use of oral hypoglycemic drugs: Secondary | ICD-10-CM | POA: Diagnosis not present

## 2017-05-27 DIAGNOSIS — K5900 Constipation, unspecified: Secondary | ICD-10-CM | POA: Diagnosis not present

## 2017-05-27 DIAGNOSIS — I10 Essential (primary) hypertension: Secondary | ICD-10-CM | POA: Diagnosis not present

## 2017-05-27 DIAGNOSIS — E7800 Pure hypercholesterolemia, unspecified: Secondary | ICD-10-CM | POA: Diagnosis not present

## 2017-05-27 DIAGNOSIS — E119 Type 2 diabetes mellitus without complications: Secondary | ICD-10-CM | POA: Diagnosis not present

## 2017-05-27 DIAGNOSIS — J309 Allergic rhinitis, unspecified: Secondary | ICD-10-CM | POA: Diagnosis not present

## 2017-05-27 DIAGNOSIS — N400 Enlarged prostate without lower urinary tract symptoms: Secondary | ICD-10-CM | POA: Diagnosis not present

## 2017-06-02 DIAGNOSIS — J3089 Other allergic rhinitis: Secondary | ICD-10-CM | POA: Diagnosis not present

## 2017-06-02 DIAGNOSIS — J301 Allergic rhinitis due to pollen: Secondary | ICD-10-CM | POA: Diagnosis not present

## 2017-06-09 DIAGNOSIS — J3089 Other allergic rhinitis: Secondary | ICD-10-CM | POA: Diagnosis not present

## 2017-06-09 DIAGNOSIS — J301 Allergic rhinitis due to pollen: Secondary | ICD-10-CM | POA: Diagnosis not present

## 2017-06-16 DIAGNOSIS — J301 Allergic rhinitis due to pollen: Secondary | ICD-10-CM | POA: Diagnosis not present

## 2017-06-16 DIAGNOSIS — J3089 Other allergic rhinitis: Secondary | ICD-10-CM | POA: Diagnosis not present

## 2017-06-18 DIAGNOSIS — J3089 Other allergic rhinitis: Secondary | ICD-10-CM | POA: Diagnosis not present

## 2017-06-18 DIAGNOSIS — J301 Allergic rhinitis due to pollen: Secondary | ICD-10-CM | POA: Diagnosis not present

## 2017-06-23 DIAGNOSIS — J301 Allergic rhinitis due to pollen: Secondary | ICD-10-CM | POA: Diagnosis not present

## 2017-06-23 DIAGNOSIS — J3089 Other allergic rhinitis: Secondary | ICD-10-CM | POA: Diagnosis not present

## 2017-06-30 DIAGNOSIS — J301 Allergic rhinitis due to pollen: Secondary | ICD-10-CM | POA: Diagnosis not present

## 2017-06-30 DIAGNOSIS — J3089 Other allergic rhinitis: Secondary | ICD-10-CM | POA: Diagnosis not present

## 2017-07-07 DIAGNOSIS — J301 Allergic rhinitis due to pollen: Secondary | ICD-10-CM | POA: Diagnosis not present

## 2017-07-07 DIAGNOSIS — J3089 Other allergic rhinitis: Secondary | ICD-10-CM | POA: Diagnosis not present

## 2017-07-09 DIAGNOSIS — J3089 Other allergic rhinitis: Secondary | ICD-10-CM | POA: Diagnosis not present

## 2017-07-09 DIAGNOSIS — J301 Allergic rhinitis due to pollen: Secondary | ICD-10-CM | POA: Diagnosis not present

## 2017-07-13 DIAGNOSIS — J3089 Other allergic rhinitis: Secondary | ICD-10-CM | POA: Diagnosis not present

## 2017-07-13 DIAGNOSIS — J301 Allergic rhinitis due to pollen: Secondary | ICD-10-CM | POA: Diagnosis not present

## 2017-07-15 DIAGNOSIS — J301 Allergic rhinitis due to pollen: Secondary | ICD-10-CM | POA: Diagnosis not present

## 2017-07-15 DIAGNOSIS — J3089 Other allergic rhinitis: Secondary | ICD-10-CM | POA: Diagnosis not present

## 2017-07-20 DIAGNOSIS — J3089 Other allergic rhinitis: Secondary | ICD-10-CM | POA: Diagnosis not present

## 2017-07-20 DIAGNOSIS — J301 Allergic rhinitis due to pollen: Secondary | ICD-10-CM | POA: Diagnosis not present

## 2017-07-22 DIAGNOSIS — J3089 Other allergic rhinitis: Secondary | ICD-10-CM | POA: Diagnosis not present

## 2017-07-27 DIAGNOSIS — J3089 Other allergic rhinitis: Secondary | ICD-10-CM | POA: Diagnosis not present

## 2017-07-27 DIAGNOSIS — J301 Allergic rhinitis due to pollen: Secondary | ICD-10-CM | POA: Diagnosis not present

## 2017-07-29 DIAGNOSIS — J301 Allergic rhinitis due to pollen: Secondary | ICD-10-CM | POA: Diagnosis not present

## 2017-08-04 DIAGNOSIS — J301 Allergic rhinitis due to pollen: Secondary | ICD-10-CM | POA: Diagnosis not present

## 2017-08-04 DIAGNOSIS — J3089 Other allergic rhinitis: Secondary | ICD-10-CM | POA: Diagnosis not present

## 2017-08-11 DIAGNOSIS — J301 Allergic rhinitis due to pollen: Secondary | ICD-10-CM | POA: Diagnosis not present

## 2017-08-11 DIAGNOSIS — J3089 Other allergic rhinitis: Secondary | ICD-10-CM | POA: Diagnosis not present

## 2017-08-18 DIAGNOSIS — J301 Allergic rhinitis due to pollen: Secondary | ICD-10-CM | POA: Diagnosis not present

## 2017-08-18 DIAGNOSIS — J3089 Other allergic rhinitis: Secondary | ICD-10-CM | POA: Diagnosis not present

## 2017-08-25 DIAGNOSIS — J3089 Other allergic rhinitis: Secondary | ICD-10-CM | POA: Diagnosis not present

## 2017-08-25 DIAGNOSIS — J3081 Allergic rhinitis due to animal (cat) (dog) hair and dander: Secondary | ICD-10-CM | POA: Diagnosis not present

## 2017-08-25 DIAGNOSIS — J301 Allergic rhinitis due to pollen: Secondary | ICD-10-CM | POA: Diagnosis not present

## 2017-09-01 DIAGNOSIS — J3089 Other allergic rhinitis: Secondary | ICD-10-CM | POA: Diagnosis not present

## 2017-09-01 DIAGNOSIS — J301 Allergic rhinitis due to pollen: Secondary | ICD-10-CM | POA: Diagnosis not present

## 2017-09-08 DIAGNOSIS — J301 Allergic rhinitis due to pollen: Secondary | ICD-10-CM | POA: Diagnosis not present

## 2017-09-08 DIAGNOSIS — J3089 Other allergic rhinitis: Secondary | ICD-10-CM | POA: Diagnosis not present

## 2017-09-15 DIAGNOSIS — J3089 Other allergic rhinitis: Secondary | ICD-10-CM | POA: Diagnosis not present

## 2017-09-15 DIAGNOSIS — J301 Allergic rhinitis due to pollen: Secondary | ICD-10-CM | POA: Diagnosis not present

## 2017-09-22 DIAGNOSIS — J3089 Other allergic rhinitis: Secondary | ICD-10-CM | POA: Diagnosis not present

## 2017-09-22 DIAGNOSIS — J301 Allergic rhinitis due to pollen: Secondary | ICD-10-CM | POA: Diagnosis not present

## 2017-09-30 DIAGNOSIS — J301 Allergic rhinitis due to pollen: Secondary | ICD-10-CM | POA: Diagnosis not present

## 2017-09-30 DIAGNOSIS — J3089 Other allergic rhinitis: Secondary | ICD-10-CM | POA: Diagnosis not present

## 2017-10-06 DIAGNOSIS — J3089 Other allergic rhinitis: Secondary | ICD-10-CM | POA: Diagnosis not present

## 2017-10-06 DIAGNOSIS — J301 Allergic rhinitis due to pollen: Secondary | ICD-10-CM | POA: Diagnosis not present

## 2017-10-13 DIAGNOSIS — J3089 Other allergic rhinitis: Secondary | ICD-10-CM | POA: Diagnosis not present

## 2017-10-13 DIAGNOSIS — J301 Allergic rhinitis due to pollen: Secondary | ICD-10-CM | POA: Diagnosis not present

## 2017-10-20 DIAGNOSIS — J3089 Other allergic rhinitis: Secondary | ICD-10-CM | POA: Diagnosis not present

## 2017-10-20 DIAGNOSIS — J301 Allergic rhinitis due to pollen: Secondary | ICD-10-CM | POA: Diagnosis not present

## 2017-10-27 DIAGNOSIS — J301 Allergic rhinitis due to pollen: Secondary | ICD-10-CM | POA: Diagnosis not present

## 2017-10-27 DIAGNOSIS — J3089 Other allergic rhinitis: Secondary | ICD-10-CM | POA: Diagnosis not present

## 2017-11-03 DIAGNOSIS — J301 Allergic rhinitis due to pollen: Secondary | ICD-10-CM | POA: Diagnosis not present

## 2017-11-03 DIAGNOSIS — J3089 Other allergic rhinitis: Secondary | ICD-10-CM | POA: Diagnosis not present

## 2017-11-06 DIAGNOSIS — J3089 Other allergic rhinitis: Secondary | ICD-10-CM | POA: Diagnosis not present

## 2017-11-06 DIAGNOSIS — J301 Allergic rhinitis due to pollen: Secondary | ICD-10-CM | POA: Diagnosis not present

## 2017-11-11 DIAGNOSIS — L570 Actinic keratosis: Secondary | ICD-10-CM | POA: Diagnosis not present

## 2017-11-11 DIAGNOSIS — D1801 Hemangioma of skin and subcutaneous tissue: Secondary | ICD-10-CM | POA: Diagnosis not present

## 2017-11-11 DIAGNOSIS — Z85828 Personal history of other malignant neoplasm of skin: Secondary | ICD-10-CM | POA: Diagnosis not present

## 2017-11-11 DIAGNOSIS — D0462 Carcinoma in situ of skin of left upper limb, including shoulder: Secondary | ICD-10-CM | POA: Diagnosis not present

## 2017-11-11 DIAGNOSIS — L565 Disseminated superficial actinic porokeratosis (DSAP): Secondary | ICD-10-CM | POA: Diagnosis not present

## 2017-11-11 DIAGNOSIS — D485 Neoplasm of uncertain behavior of skin: Secondary | ICD-10-CM | POA: Diagnosis not present

## 2017-11-11 DIAGNOSIS — B0089 Other herpesviral infection: Secondary | ICD-10-CM | POA: Diagnosis not present

## 2017-11-11 DIAGNOSIS — L821 Other seborrheic keratosis: Secondary | ICD-10-CM | POA: Diagnosis not present

## 2017-11-12 DIAGNOSIS — J301 Allergic rhinitis due to pollen: Secondary | ICD-10-CM | POA: Diagnosis not present

## 2017-11-12 DIAGNOSIS — J3089 Other allergic rhinitis: Secondary | ICD-10-CM | POA: Diagnosis not present

## 2017-11-17 DIAGNOSIS — J3089 Other allergic rhinitis: Secondary | ICD-10-CM | POA: Diagnosis not present

## 2017-11-17 DIAGNOSIS — J301 Allergic rhinitis due to pollen: Secondary | ICD-10-CM | POA: Diagnosis not present

## 2017-11-24 DIAGNOSIS — J301 Allergic rhinitis due to pollen: Secondary | ICD-10-CM | POA: Diagnosis not present

## 2017-11-24 DIAGNOSIS — J3089 Other allergic rhinitis: Secondary | ICD-10-CM | POA: Diagnosis not present

## 2017-11-26 DIAGNOSIS — J301 Allergic rhinitis due to pollen: Secondary | ICD-10-CM | POA: Diagnosis not present

## 2017-11-26 DIAGNOSIS — J3089 Other allergic rhinitis: Secondary | ICD-10-CM | POA: Diagnosis not present

## 2017-12-01 DIAGNOSIS — J3089 Other allergic rhinitis: Secondary | ICD-10-CM | POA: Diagnosis not present

## 2017-12-01 DIAGNOSIS — J301 Allergic rhinitis due to pollen: Secondary | ICD-10-CM | POA: Diagnosis not present

## 2017-12-03 DIAGNOSIS — J3089 Other allergic rhinitis: Secondary | ICD-10-CM | POA: Diagnosis not present

## 2017-12-03 DIAGNOSIS — J301 Allergic rhinitis due to pollen: Secondary | ICD-10-CM | POA: Diagnosis not present

## 2017-12-07 DIAGNOSIS — H903 Sensorineural hearing loss, bilateral: Secondary | ICD-10-CM | POA: Diagnosis not present

## 2017-12-07 DIAGNOSIS — Z974 Presence of external hearing-aid: Secondary | ICD-10-CM | POA: Diagnosis not present

## 2017-12-07 DIAGNOSIS — H6123 Impacted cerumen, bilateral: Secondary | ICD-10-CM | POA: Diagnosis not present

## 2017-12-08 DIAGNOSIS — J301 Allergic rhinitis due to pollen: Secondary | ICD-10-CM | POA: Diagnosis not present

## 2017-12-08 DIAGNOSIS — J3089 Other allergic rhinitis: Secondary | ICD-10-CM | POA: Diagnosis not present

## 2017-12-10 DIAGNOSIS — E7800 Pure hypercholesterolemia, unspecified: Secondary | ICD-10-CM | POA: Diagnosis not present

## 2017-12-10 DIAGNOSIS — K5900 Constipation, unspecified: Secondary | ICD-10-CM | POA: Diagnosis not present

## 2017-12-10 DIAGNOSIS — I10 Essential (primary) hypertension: Secondary | ICD-10-CM | POA: Diagnosis not present

## 2017-12-10 DIAGNOSIS — N400 Enlarged prostate without lower urinary tract symptoms: Secondary | ICD-10-CM | POA: Diagnosis not present

## 2017-12-10 DIAGNOSIS — J301 Allergic rhinitis due to pollen: Secondary | ICD-10-CM | POA: Diagnosis not present

## 2017-12-10 DIAGNOSIS — J3089 Other allergic rhinitis: Secondary | ICD-10-CM | POA: Diagnosis not present

## 2017-12-10 DIAGNOSIS — E119 Type 2 diabetes mellitus without complications: Secondary | ICD-10-CM | POA: Diagnosis not present

## 2017-12-10 DIAGNOSIS — J309 Allergic rhinitis, unspecified: Secondary | ICD-10-CM | POA: Diagnosis not present

## 2017-12-15 DIAGNOSIS — J3089 Other allergic rhinitis: Secondary | ICD-10-CM | POA: Diagnosis not present

## 2017-12-15 DIAGNOSIS — J301 Allergic rhinitis due to pollen: Secondary | ICD-10-CM | POA: Diagnosis not present

## 2017-12-22 DIAGNOSIS — J3089 Other allergic rhinitis: Secondary | ICD-10-CM | POA: Diagnosis not present

## 2017-12-22 DIAGNOSIS — J301 Allergic rhinitis due to pollen: Secondary | ICD-10-CM | POA: Diagnosis not present

## 2017-12-29 DIAGNOSIS — H524 Presbyopia: Secondary | ICD-10-CM | POA: Diagnosis not present

## 2017-12-29 DIAGNOSIS — J3089 Other allergic rhinitis: Secondary | ICD-10-CM | POA: Diagnosis not present

## 2017-12-29 DIAGNOSIS — E119 Type 2 diabetes mellitus without complications: Secondary | ICD-10-CM | POA: Diagnosis not present

## 2017-12-29 DIAGNOSIS — J301 Allergic rhinitis due to pollen: Secondary | ICD-10-CM | POA: Diagnosis not present

## 2017-12-29 DIAGNOSIS — Z961 Presence of intraocular lens: Secondary | ICD-10-CM | POA: Diagnosis not present

## 2018-01-05 DIAGNOSIS — J301 Allergic rhinitis due to pollen: Secondary | ICD-10-CM | POA: Diagnosis not present

## 2018-01-05 DIAGNOSIS — J3089 Other allergic rhinitis: Secondary | ICD-10-CM | POA: Diagnosis not present

## 2018-01-12 DIAGNOSIS — J301 Allergic rhinitis due to pollen: Secondary | ICD-10-CM | POA: Diagnosis not present

## 2018-01-12 DIAGNOSIS — J3089 Other allergic rhinitis: Secondary | ICD-10-CM | POA: Diagnosis not present

## 2018-01-19 DIAGNOSIS — J301 Allergic rhinitis due to pollen: Secondary | ICD-10-CM | POA: Diagnosis not present

## 2018-01-19 DIAGNOSIS — J3089 Other allergic rhinitis: Secondary | ICD-10-CM | POA: Diagnosis not present

## 2018-01-26 DIAGNOSIS — J3089 Other allergic rhinitis: Secondary | ICD-10-CM | POA: Diagnosis not present

## 2018-01-26 DIAGNOSIS — J301 Allergic rhinitis due to pollen: Secondary | ICD-10-CM | POA: Diagnosis not present

## 2018-02-02 DIAGNOSIS — J301 Allergic rhinitis due to pollen: Secondary | ICD-10-CM | POA: Diagnosis not present

## 2018-02-02 DIAGNOSIS — J3089 Other allergic rhinitis: Secondary | ICD-10-CM | POA: Diagnosis not present

## 2018-02-09 DIAGNOSIS — J301 Allergic rhinitis due to pollen: Secondary | ICD-10-CM | POA: Diagnosis not present

## 2018-02-09 DIAGNOSIS — J3089 Other allergic rhinitis: Secondary | ICD-10-CM | POA: Diagnosis not present

## 2018-02-12 DIAGNOSIS — Z974 Presence of external hearing-aid: Secondary | ICD-10-CM | POA: Diagnosis not present

## 2018-02-12 DIAGNOSIS — H903 Sensorineural hearing loss, bilateral: Secondary | ICD-10-CM | POA: Diagnosis not present

## 2018-02-12 DIAGNOSIS — T161XXA Foreign body in right ear, initial encounter: Secondary | ICD-10-CM | POA: Diagnosis not present

## 2018-02-12 DIAGNOSIS — H938X2 Other specified disorders of left ear: Secondary | ICD-10-CM | POA: Diagnosis not present

## 2018-02-12 DIAGNOSIS — T162XXA Foreign body in left ear, initial encounter: Secondary | ICD-10-CM | POA: Diagnosis not present

## 2018-02-16 DIAGNOSIS — J3089 Other allergic rhinitis: Secondary | ICD-10-CM | POA: Diagnosis not present

## 2018-02-16 DIAGNOSIS — H1045 Other chronic allergic conjunctivitis: Secondary | ICD-10-CM | POA: Diagnosis not present

## 2018-02-16 DIAGNOSIS — J301 Allergic rhinitis due to pollen: Secondary | ICD-10-CM | POA: Diagnosis not present

## 2018-02-23 DIAGNOSIS — J301 Allergic rhinitis due to pollen: Secondary | ICD-10-CM | POA: Diagnosis not present

## 2018-02-23 DIAGNOSIS — J3089 Other allergic rhinitis: Secondary | ICD-10-CM | POA: Diagnosis not present

## 2018-03-02 DIAGNOSIS — J301 Allergic rhinitis due to pollen: Secondary | ICD-10-CM | POA: Diagnosis not present

## 2018-03-02 DIAGNOSIS — J3089 Other allergic rhinitis: Secondary | ICD-10-CM | POA: Diagnosis not present

## 2018-03-09 DIAGNOSIS — J301 Allergic rhinitis due to pollen: Secondary | ICD-10-CM | POA: Diagnosis not present

## 2018-03-09 DIAGNOSIS — J3089 Other allergic rhinitis: Secondary | ICD-10-CM | POA: Diagnosis not present

## 2018-03-16 DIAGNOSIS — J301 Allergic rhinitis due to pollen: Secondary | ICD-10-CM | POA: Diagnosis not present

## 2018-03-16 DIAGNOSIS — J3089 Other allergic rhinitis: Secondary | ICD-10-CM | POA: Diagnosis not present

## 2018-03-23 DIAGNOSIS — J3089 Other allergic rhinitis: Secondary | ICD-10-CM | POA: Diagnosis not present

## 2018-03-23 DIAGNOSIS — J301 Allergic rhinitis due to pollen: Secondary | ICD-10-CM | POA: Diagnosis not present

## 2018-03-26 DIAGNOSIS — T162XXA Foreign body in left ear, initial encounter: Secondary | ICD-10-CM | POA: Diagnosis not present

## 2018-03-30 DIAGNOSIS — J3089 Other allergic rhinitis: Secondary | ICD-10-CM | POA: Diagnosis not present

## 2018-03-30 DIAGNOSIS — J301 Allergic rhinitis due to pollen: Secondary | ICD-10-CM | POA: Diagnosis not present

## 2018-04-02 DIAGNOSIS — J3089 Other allergic rhinitis: Secondary | ICD-10-CM | POA: Diagnosis not present

## 2018-04-02 DIAGNOSIS — J301 Allergic rhinitis due to pollen: Secondary | ICD-10-CM | POA: Diagnosis not present

## 2018-04-06 DIAGNOSIS — J3089 Other allergic rhinitis: Secondary | ICD-10-CM | POA: Diagnosis not present

## 2018-04-06 DIAGNOSIS — J301 Allergic rhinitis due to pollen: Secondary | ICD-10-CM | POA: Diagnosis not present

## 2018-04-13 DIAGNOSIS — J3089 Other allergic rhinitis: Secondary | ICD-10-CM | POA: Diagnosis not present

## 2018-04-13 DIAGNOSIS — J301 Allergic rhinitis due to pollen: Secondary | ICD-10-CM | POA: Diagnosis not present

## 2018-04-15 DIAGNOSIS — J301 Allergic rhinitis due to pollen: Secondary | ICD-10-CM | POA: Diagnosis not present

## 2018-04-15 DIAGNOSIS — J3089 Other allergic rhinitis: Secondary | ICD-10-CM | POA: Diagnosis not present

## 2018-04-20 DIAGNOSIS — J3089 Other allergic rhinitis: Secondary | ICD-10-CM | POA: Diagnosis not present

## 2018-04-20 DIAGNOSIS — J301 Allergic rhinitis due to pollen: Secondary | ICD-10-CM | POA: Diagnosis not present

## 2018-04-22 DIAGNOSIS — J3089 Other allergic rhinitis: Secondary | ICD-10-CM | POA: Diagnosis not present

## 2018-04-22 DIAGNOSIS — J301 Allergic rhinitis due to pollen: Secondary | ICD-10-CM | POA: Diagnosis not present

## 2018-04-27 DIAGNOSIS — J3089 Other allergic rhinitis: Secondary | ICD-10-CM | POA: Diagnosis not present

## 2018-04-27 DIAGNOSIS — J301 Allergic rhinitis due to pollen: Secondary | ICD-10-CM | POA: Diagnosis not present

## 2018-05-04 DIAGNOSIS — J3089 Other allergic rhinitis: Secondary | ICD-10-CM | POA: Diagnosis not present

## 2018-05-04 DIAGNOSIS — J301 Allergic rhinitis due to pollen: Secondary | ICD-10-CM | POA: Diagnosis not present

## 2018-05-11 DIAGNOSIS — J3089 Other allergic rhinitis: Secondary | ICD-10-CM | POA: Diagnosis not present

## 2018-05-11 DIAGNOSIS — J301 Allergic rhinitis due to pollen: Secondary | ICD-10-CM | POA: Diagnosis not present

## 2018-05-17 DIAGNOSIS — Z85828 Personal history of other malignant neoplasm of skin: Secondary | ICD-10-CM | POA: Diagnosis not present

## 2018-05-17 DIAGNOSIS — L821 Other seborrheic keratosis: Secondary | ICD-10-CM | POA: Diagnosis not present

## 2018-05-17 DIAGNOSIS — D1801 Hemangioma of skin and subcutaneous tissue: Secondary | ICD-10-CM | POA: Diagnosis not present

## 2018-05-17 DIAGNOSIS — L565 Disseminated superficial actinic porokeratosis (DSAP): Secondary | ICD-10-CM | POA: Diagnosis not present

## 2018-05-18 DIAGNOSIS — J301 Allergic rhinitis due to pollen: Secondary | ICD-10-CM | POA: Diagnosis not present

## 2018-05-18 DIAGNOSIS — J3089 Other allergic rhinitis: Secondary | ICD-10-CM | POA: Diagnosis not present

## 2018-05-25 DIAGNOSIS — J301 Allergic rhinitis due to pollen: Secondary | ICD-10-CM | POA: Diagnosis not present

## 2018-05-25 DIAGNOSIS — J3089 Other allergic rhinitis: Secondary | ICD-10-CM | POA: Diagnosis not present

## 2018-06-01 DIAGNOSIS — J301 Allergic rhinitis due to pollen: Secondary | ICD-10-CM | POA: Diagnosis not present

## 2018-06-01 DIAGNOSIS — J3089 Other allergic rhinitis: Secondary | ICD-10-CM | POA: Diagnosis not present

## 2018-06-08 DIAGNOSIS — J3089 Other allergic rhinitis: Secondary | ICD-10-CM | POA: Diagnosis not present

## 2018-06-08 DIAGNOSIS — J301 Allergic rhinitis due to pollen: Secondary | ICD-10-CM | POA: Diagnosis not present

## 2018-06-14 DIAGNOSIS — J309 Allergic rhinitis, unspecified: Secondary | ICD-10-CM | POA: Diagnosis not present

## 2018-06-14 DIAGNOSIS — R002 Palpitations: Secondary | ICD-10-CM | POA: Diagnosis not present

## 2018-06-14 DIAGNOSIS — N400 Enlarged prostate without lower urinary tract symptoms: Secondary | ICD-10-CM | POA: Diagnosis not present

## 2018-06-14 DIAGNOSIS — I10 Essential (primary) hypertension: Secondary | ICD-10-CM | POA: Diagnosis not present

## 2018-06-14 DIAGNOSIS — E7800 Pure hypercholesterolemia, unspecified: Secondary | ICD-10-CM | POA: Diagnosis not present

## 2018-06-14 DIAGNOSIS — K5900 Constipation, unspecified: Secondary | ICD-10-CM | POA: Diagnosis not present

## 2018-06-14 DIAGNOSIS — E119 Type 2 diabetes mellitus without complications: Secondary | ICD-10-CM | POA: Diagnosis not present

## 2018-06-15 DIAGNOSIS — J301 Allergic rhinitis due to pollen: Secondary | ICD-10-CM | POA: Diagnosis not present

## 2018-06-15 DIAGNOSIS — J3089 Other allergic rhinitis: Secondary | ICD-10-CM | POA: Diagnosis not present

## 2018-06-22 DIAGNOSIS — J3089 Other allergic rhinitis: Secondary | ICD-10-CM | POA: Diagnosis not present

## 2018-06-22 DIAGNOSIS — J301 Allergic rhinitis due to pollen: Secondary | ICD-10-CM | POA: Diagnosis not present

## 2018-06-29 DIAGNOSIS — J3089 Other allergic rhinitis: Secondary | ICD-10-CM | POA: Diagnosis not present

## 2018-06-29 DIAGNOSIS — J301 Allergic rhinitis due to pollen: Secondary | ICD-10-CM | POA: Diagnosis not present

## 2018-07-06 DIAGNOSIS — J301 Allergic rhinitis due to pollen: Secondary | ICD-10-CM | POA: Diagnosis not present

## 2018-07-06 DIAGNOSIS — J3089 Other allergic rhinitis: Secondary | ICD-10-CM | POA: Diagnosis not present

## 2018-12-13 DIAGNOSIS — N400 Enlarged prostate without lower urinary tract symptoms: Secondary | ICD-10-CM | POA: Diagnosis not present

## 2018-12-13 DIAGNOSIS — E7800 Pure hypercholesterolemia, unspecified: Secondary | ICD-10-CM | POA: Diagnosis not present

## 2018-12-13 DIAGNOSIS — K5900 Constipation, unspecified: Secondary | ICD-10-CM | POA: Diagnosis not present

## 2018-12-13 DIAGNOSIS — J309 Allergic rhinitis, unspecified: Secondary | ICD-10-CM | POA: Diagnosis not present

## 2018-12-13 DIAGNOSIS — E119 Type 2 diabetes mellitus without complications: Secondary | ICD-10-CM | POA: Diagnosis not present

## 2018-12-13 DIAGNOSIS — I10 Essential (primary) hypertension: Secondary | ICD-10-CM | POA: Diagnosis not present

## 2018-12-15 DIAGNOSIS — E7800 Pure hypercholesterolemia, unspecified: Secondary | ICD-10-CM | POA: Diagnosis not present

## 2018-12-15 DIAGNOSIS — E119 Type 2 diabetes mellitus without complications: Secondary | ICD-10-CM | POA: Diagnosis not present

## 2019-03-11 DIAGNOSIS — Z23 Encounter for immunization: Secondary | ICD-10-CM | POA: Diagnosis not present

## 2019-05-12 ENCOUNTER — Ambulatory Visit: Payer: Medicare Other | Attending: Internal Medicine

## 2019-05-12 DIAGNOSIS — Z23 Encounter for immunization: Secondary | ICD-10-CM | POA: Insufficient documentation

## 2019-05-12 NOTE — Progress Notes (Signed)
   Covid-19 Vaccination Clinic  Name:  Francisco Leon    MRN: [id number] DOB: May 05, 1931  05/12/2019  [name] was observed post Covid-19 immunization for 15 minutes without incidence. He was provided with Vaccine Information Sheet and instruction to access the V-Safe system.   [name] was instructed to call 911 with any severe reactions post vaccine: Marland Kitchen Difficulty breathing  . Swelling of your face and throat  . A fast heartbeat  . A bad rash all over your body  . Dizziness and weakness    Immunizations Administered    Name Date Dose VIS Date Route   Pfizer COVID-19 Vaccine 05/12/2019  3:37 AM 0.3 mL 04/01/2019 Intramuscular   Manufacturer: [place]   Lot: [id number]   [place]: [id number]

## 2019-06-02 ENCOUNTER — Ambulatory Visit: Payer: Medicare Other | Attending: Internal Medicine

## 2019-06-02 DIAGNOSIS — Z23 Encounter for immunization: Secondary | ICD-10-CM | POA: Insufficient documentation

## 2019-06-02 NOTE — Progress Notes (Signed)
   Covid-19 Vaccination Clinic  Name:  Francisco Leon    MRN: [id number] DOB: 1931/06/28  06/02/2019  [name] was observed post Covid-19 immunization for 15 minutes without incidence. He was provided with Vaccine Information Sheet and instruction to access the V-Safe system.   [name] was instructed to call 911 with any severe reactions post vaccine: Marland Kitchen Difficulty breathing  . Swelling of your face and throat  . A fast heartbeat  . A bad rash all over your body  . Dizziness and weakness    Immunizations Administered    Name Date Dose VIS Date Route   Pfizer COVID-19 Vaccine 06/02/2019  4:54 PM 0.3 mL 04/01/2019 Intramuscular   Manufacturer: [place]   Lot: [id number]   [place]: [id number]

## 2019-09-13 DIAGNOSIS — J309 Allergic rhinitis, unspecified: Secondary | ICD-10-CM | POA: Diagnosis not present

## 2019-09-13 DIAGNOSIS — N400 Enlarged prostate without lower urinary tract symptoms: Secondary | ICD-10-CM | POA: Diagnosis not present

## 2019-09-13 DIAGNOSIS — R601 Generalized edema: Secondary | ICD-10-CM | POA: Diagnosis not present

## 2019-09-13 DIAGNOSIS — R2689 Other abnormalities of gait and mobility: Secondary | ICD-10-CM | POA: Diagnosis not present

## 2019-09-13 DIAGNOSIS — E119 Type 2 diabetes mellitus without complications: Secondary | ICD-10-CM | POA: Diagnosis not present

## 2019-09-13 DIAGNOSIS — E7800 Pure hypercholesterolemia, unspecified: Secondary | ICD-10-CM | POA: Diagnosis not present

## 2019-09-13 DIAGNOSIS — I10 Essential (primary) hypertension: Secondary | ICD-10-CM | POA: Diagnosis not present

## 2019-10-20 DIAGNOSIS — R350 Frequency of micturition: Secondary | ICD-10-CM | POA: Diagnosis not present

## 2019-10-20 DIAGNOSIS — R310 Gross hematuria: Secondary | ICD-10-CM | POA: Diagnosis not present

## 2019-10-25 DIAGNOSIS — R310 Gross hematuria: Secondary | ICD-10-CM | POA: Diagnosis not present

## 2019-11-03 DIAGNOSIS — R3915 Urgency of urination: Secondary | ICD-10-CM | POA: Diagnosis not present

## 2019-11-03 DIAGNOSIS — N401 Enlarged prostate with lower urinary tract symptoms: Secondary | ICD-10-CM | POA: Diagnosis not present

## 2019-11-03 DIAGNOSIS — N130 Hydronephrosis with ureteropelvic junction obstruction: Secondary | ICD-10-CM | POA: Diagnosis not present

## 2019-11-03 DIAGNOSIS — R310 Gross hematuria: Secondary | ICD-10-CM | POA: Diagnosis not present

## 2019-11-21 DIAGNOSIS — H1045 Other chronic allergic conjunctivitis: Secondary | ICD-10-CM | POA: Diagnosis not present

## 2019-11-21 DIAGNOSIS — J3089 Other allergic rhinitis: Secondary | ICD-10-CM | POA: Diagnosis not present

## 2019-11-21 DIAGNOSIS — J301 Allergic rhinitis due to pollen: Secondary | ICD-10-CM | POA: Diagnosis not present

## 2019-12-06 DIAGNOSIS — L821 Other seborrheic keratosis: Secondary | ICD-10-CM | POA: Diagnosis not present

## 2019-12-06 DIAGNOSIS — C44319 Basal cell carcinoma of skin of other parts of face: Secondary | ICD-10-CM | POA: Diagnosis not present

## 2019-12-06 DIAGNOSIS — Z85828 Personal history of other malignant neoplasm of skin: Secondary | ICD-10-CM | POA: Diagnosis not present

## 2019-12-06 DIAGNOSIS — D225 Melanocytic nevi of trunk: Secondary | ICD-10-CM | POA: Diagnosis not present

## 2019-12-06 DIAGNOSIS — D485 Neoplasm of uncertain behavior of skin: Secondary | ICD-10-CM | POA: Diagnosis not present

## 2019-12-06 DIAGNOSIS — L570 Actinic keratosis: Secondary | ICD-10-CM | POA: Diagnosis not present

## 2019-12-06 DIAGNOSIS — D1801 Hemangioma of skin and subcutaneous tissue: Secondary | ICD-10-CM | POA: Diagnosis not present

## 2019-12-06 DIAGNOSIS — L565 Disseminated superficial actinic porokeratosis (DSAP): Secondary | ICD-10-CM | POA: Diagnosis not present

## 2019-12-06 DIAGNOSIS — C4441 Basal cell carcinoma of skin of scalp and neck: Secondary | ICD-10-CM | POA: Diagnosis not present

## 2019-12-16 DIAGNOSIS — E119 Type 2 diabetes mellitus without complications: Secondary | ICD-10-CM | POA: Diagnosis not present

## 2019-12-16 DIAGNOSIS — H52203 Unspecified astigmatism, bilateral: Secondary | ICD-10-CM | POA: Diagnosis not present

## 2019-12-16 DIAGNOSIS — H18593 Other hereditary corneal dystrophies, bilateral: Secondary | ICD-10-CM | POA: Diagnosis not present

## 2020-02-13 DIAGNOSIS — J22 Unspecified acute lower respiratory infection: Secondary | ICD-10-CM | POA: Diagnosis not present

## 2020-02-13 DIAGNOSIS — Z03818 Encounter for observation for suspected exposure to other biological agents ruled out: Secondary | ICD-10-CM | POA: Diagnosis not present

## 2020-02-20 DIAGNOSIS — R310 Gross hematuria: Secondary | ICD-10-CM | POA: Diagnosis not present

## 2020-02-20 DIAGNOSIS — N1330 Unspecified hydronephrosis: Secondary | ICD-10-CM | POA: Diagnosis not present

## 2020-03-03 ENCOUNTER — Ambulatory Visit: Payer: Medicare Other | Attending: Internal Medicine

## 2020-03-03 DIAGNOSIS — Z23 Encounter for immunization: Secondary | ICD-10-CM

## 2020-03-03 NOTE — Progress Notes (Signed)
   Covid-19 Vaccination Clinic  Name:  Francisco Leon    MRN: [id number] DOB: 1931-07-17  03/03/2020  [name] was observed post Covid-19 immunization for 15 minutes without incident. He was provided with Vaccine Information Sheet and instruction to access the V-Safe system.   [name] was instructed to call 911 with any severe reactions post vaccine: Marland Kitchen Difficulty breathing  . Swelling of face and throat  . A fast heartbeat  . A bad rash all over body  . Dizziness and weakness   Immunizations Administered    Name Date Dose VIS Date Route   Pfizer COVID-19 Vaccine 03/03/2020  3:14 PM 0.3 mL 02/08/2020 Intramuscular   Manufacturer: [place]   Lot: [id number]   [place]: [id number]

## 2020-04-02 DIAGNOSIS — N130 Hydronephrosis with ureteropelvic junction obstruction: Secondary | ICD-10-CM | POA: Diagnosis not present

## 2020-04-02 DIAGNOSIS — R351 Nocturia: Secondary | ICD-10-CM | POA: Diagnosis not present

## 2020-04-02 DIAGNOSIS — N401 Enlarged prostate with lower urinary tract symptoms: Secondary | ICD-10-CM | POA: Diagnosis not present

## 2020-05-23 DIAGNOSIS — J309 Allergic rhinitis, unspecified: Secondary | ICD-10-CM | POA: Diagnosis not present

## 2020-05-23 DIAGNOSIS — E1169 Type 2 diabetes mellitus with other specified complication: Secondary | ICD-10-CM | POA: Diagnosis not present

## 2020-05-23 DIAGNOSIS — E7800 Pure hypercholesterolemia, unspecified: Secondary | ICD-10-CM | POA: Diagnosis not present

## 2020-05-23 DIAGNOSIS — R601 Generalized edema: Secondary | ICD-10-CM | POA: Diagnosis not present

## 2020-05-23 DIAGNOSIS — Z0000 Encounter for general adult medical examination without abnormal findings: Secondary | ICD-10-CM | POA: Diagnosis not present

## 2020-05-23 DIAGNOSIS — R2689 Other abnormalities of gait and mobility: Secondary | ICD-10-CM | POA: Diagnosis not present

## 2020-05-23 DIAGNOSIS — N400 Enlarged prostate without lower urinary tract symptoms: Secondary | ICD-10-CM | POA: Diagnosis not present

## 2020-05-23 DIAGNOSIS — Z23 Encounter for immunization: Secondary | ICD-10-CM | POA: Diagnosis not present

## 2020-05-23 DIAGNOSIS — I10 Essential (primary) hypertension: Secondary | ICD-10-CM | POA: Diagnosis not present

## 2020-06-06 DIAGNOSIS — Z974 Presence of external hearing-aid: Secondary | ICD-10-CM | POA: Diagnosis not present

## 2020-06-06 DIAGNOSIS — H6123 Impacted cerumen, bilateral: Secondary | ICD-10-CM | POA: Diagnosis not present

## 2020-06-06 DIAGNOSIS — H903 Sensorineural hearing loss, bilateral: Secondary | ICD-10-CM | POA: Diagnosis not present

## 2020-10-01 DIAGNOSIS — N401 Enlarged prostate with lower urinary tract symptoms: Secondary | ICD-10-CM | POA: Diagnosis not present

## 2020-10-08 DIAGNOSIS — N130 Hydronephrosis with ureteropelvic junction obstruction: Secondary | ICD-10-CM | POA: Diagnosis not present

## 2020-10-08 DIAGNOSIS — R3915 Urgency of urination: Secondary | ICD-10-CM | POA: Diagnosis not present

## 2020-10-08 DIAGNOSIS — N401 Enlarged prostate with lower urinary tract symptoms: Secondary | ICD-10-CM | POA: Diagnosis not present

## 2020-11-20 DIAGNOSIS — H1045 Other chronic allergic conjunctivitis: Secondary | ICD-10-CM | POA: Diagnosis not present

## 2020-11-20 DIAGNOSIS — J3089 Other allergic rhinitis: Secondary | ICD-10-CM | POA: Diagnosis not present

## 2020-11-20 DIAGNOSIS — J301 Allergic rhinitis due to pollen: Secondary | ICD-10-CM | POA: Diagnosis not present

## 2020-11-22 DIAGNOSIS — I10 Essential (primary) hypertension: Secondary | ICD-10-CM | POA: Diagnosis not present

## 2020-11-22 DIAGNOSIS — K439 Ventral hernia without obstruction or gangrene: Secondary | ICD-10-CM | POA: Diagnosis not present

## 2020-11-22 DIAGNOSIS — R601 Generalized edema: Secondary | ICD-10-CM | POA: Diagnosis not present

## 2020-11-22 DIAGNOSIS — J309 Allergic rhinitis, unspecified: Secondary | ICD-10-CM | POA: Diagnosis not present

## 2020-11-22 DIAGNOSIS — N400 Enlarged prostate without lower urinary tract symptoms: Secondary | ICD-10-CM | POA: Diagnosis not present

## 2020-11-22 DIAGNOSIS — R2689 Other abnormalities of gait and mobility: Secondary | ICD-10-CM | POA: Diagnosis not present

## 2020-11-22 DIAGNOSIS — E7800 Pure hypercholesterolemia, unspecified: Secondary | ICD-10-CM | POA: Diagnosis not present

## 2020-11-22 DIAGNOSIS — E119 Type 2 diabetes mellitus without complications: Secondary | ICD-10-CM | POA: Diagnosis not present

## 2020-12-05 DIAGNOSIS — D1801 Hemangioma of skin and subcutaneous tissue: Secondary | ICD-10-CM | POA: Diagnosis not present

## 2020-12-05 DIAGNOSIS — L565 Disseminated superficial actinic porokeratosis (DSAP): Secondary | ICD-10-CM | POA: Diagnosis not present

## 2020-12-05 DIAGNOSIS — L821 Other seborrheic keratosis: Secondary | ICD-10-CM | POA: Diagnosis not present

## 2020-12-05 DIAGNOSIS — L570 Actinic keratosis: Secondary | ICD-10-CM | POA: Diagnosis not present

## 2020-12-05 DIAGNOSIS — Z85828 Personal history of other malignant neoplasm of skin: Secondary | ICD-10-CM | POA: Diagnosis not present

## 2021-01-09 DIAGNOSIS — U071 COVID-19: Secondary | ICD-10-CM | POA: Diagnosis not present

## 2021-02-18 DIAGNOSIS — Z23 Encounter for immunization: Secondary | ICD-10-CM | POA: Diagnosis not present

## 2021-02-18 DIAGNOSIS — H1820 Unspecified corneal edema: Secondary | ICD-10-CM | POA: Diagnosis not present

## 2021-02-18 DIAGNOSIS — H18593 Other hereditary corneal dystrophies, bilateral: Secondary | ICD-10-CM | POA: Diagnosis not present

## 2021-02-18 DIAGNOSIS — E119 Type 2 diabetes mellitus without complications: Secondary | ICD-10-CM | POA: Diagnosis not present

## 2021-02-18 DIAGNOSIS — H52203 Unspecified astigmatism, bilateral: Secondary | ICD-10-CM | POA: Diagnosis not present

## 2021-03-21 DIAGNOSIS — H1820 Unspecified corneal edema: Secondary | ICD-10-CM | POA: Diagnosis not present

## 2021-03-21 DIAGNOSIS — H18593 Other hereditary corneal dystrophies, bilateral: Secondary | ICD-10-CM | POA: Diagnosis not present

## 2021-04-19 DIAGNOSIS — Z23 Encounter for immunization: Secondary | ICD-10-CM | POA: Diagnosis not present

## 2021-04-25 DIAGNOSIS — R413 Other amnesia: Secondary | ICD-10-CM | POA: Diagnosis not present

## 2021-04-25 DIAGNOSIS — R2689 Other abnormalities of gait and mobility: Secondary | ICD-10-CM | POA: Diagnosis not present

## 2021-06-10 DIAGNOSIS — R601 Generalized edema: Secondary | ICD-10-CM | POA: Diagnosis not present

## 2021-06-10 DIAGNOSIS — E1169 Type 2 diabetes mellitus with other specified complication: Secondary | ICD-10-CM | POA: Diagnosis not present

## 2021-06-10 DIAGNOSIS — I10 Essential (primary) hypertension: Secondary | ICD-10-CM | POA: Diagnosis not present

## 2021-06-10 DIAGNOSIS — E119 Type 2 diabetes mellitus without complications: Secondary | ICD-10-CM | POA: Diagnosis not present

## 2021-06-10 DIAGNOSIS — J309 Allergic rhinitis, unspecified: Secondary | ICD-10-CM | POA: Diagnosis not present

## 2021-06-10 DIAGNOSIS — Z0000 Encounter for general adult medical examination without abnormal findings: Secondary | ICD-10-CM | POA: Diagnosis not present

## 2021-06-10 DIAGNOSIS — E7800 Pure hypercholesterolemia, unspecified: Secondary | ICD-10-CM | POA: Diagnosis not present

## 2021-06-10 DIAGNOSIS — N400 Enlarged prostate without lower urinary tract symptoms: Secondary | ICD-10-CM | POA: Diagnosis not present

## 2021-06-10 DIAGNOSIS — R2689 Other abnormalities of gait and mobility: Secondary | ICD-10-CM | POA: Diagnosis not present

## 2021-06-11 DIAGNOSIS — E1169 Type 2 diabetes mellitus with other specified complication: Secondary | ICD-10-CM | POA: Diagnosis not present

## 2021-06-13 DIAGNOSIS — R6883 Chills (without fever): Secondary | ICD-10-CM | POA: Diagnosis not present

## 2021-06-13 DIAGNOSIS — R5383 Other fatigue: Secondary | ICD-10-CM | POA: Diagnosis not present

## 2021-06-13 DIAGNOSIS — R011 Cardiac murmur, unspecified: Secondary | ICD-10-CM | POA: Diagnosis not present

## 2021-06-13 DIAGNOSIS — Z03818 Encounter for observation for suspected exposure to other biological agents ruled out: Secondary | ICD-10-CM | POA: Diagnosis not present

## 2021-06-13 DIAGNOSIS — R531 Weakness: Secondary | ICD-10-CM | POA: Diagnosis not present

## 2021-06-13 DIAGNOSIS — R600 Localized edema: Secondary | ICD-10-CM | POA: Diagnosis not present

## 2021-06-14 DIAGNOSIS — R011 Cardiac murmur, unspecified: Secondary | ICD-10-CM | POA: Diagnosis not present

## 2021-06-14 DIAGNOSIS — R531 Weakness: Secondary | ICD-10-CM | POA: Diagnosis not present

## 2021-06-20 DIAGNOSIS — R350 Frequency of micturition: Secondary | ICD-10-CM | POA: Diagnosis not present

## 2021-06-20 DIAGNOSIS — R8279 Other abnormal findings on microbiological examination of urine: Secondary | ICD-10-CM | POA: Diagnosis not present

## 2021-06-20 DIAGNOSIS — N401 Enlarged prostate with lower urinary tract symptoms: Secondary | ICD-10-CM | POA: Diagnosis not present

## 2021-07-22 DIAGNOSIS — R310 Gross hematuria: Secondary | ICD-10-CM | POA: Diagnosis not present

## 2021-08-06 DIAGNOSIS — R059 Cough, unspecified: Secondary | ICD-10-CM | POA: Diagnosis not present

## 2021-08-06 DIAGNOSIS — J3489 Other specified disorders of nose and nasal sinuses: Secondary | ICD-10-CM | POA: Diagnosis not present

## 2021-08-06 DIAGNOSIS — R2689 Other abnormalities of gait and mobility: Secondary | ICD-10-CM | POA: Diagnosis not present

## 2021-08-06 DIAGNOSIS — S99929A Unspecified injury of unspecified foot, initial encounter: Secondary | ICD-10-CM | POA: Diagnosis not present

## 2021-08-07 ENCOUNTER — Encounter: Payer: Self-pay | Admitting: Neurology

## 2021-08-22 NOTE — Progress Notes (Signed)
? ? ?Assessment/Plan:  ? ? ? ? Gait instability ?Likely, in part, due to DM x 20 years discussed nature and pathophysiology.  Discussed role of physical therapy.  They are going to think about that and will let me know if they want a referral. ?No evidence of Parkinson's disease.  Reassurance provided today. ? ?2.  Possible Memory loss ? -Discussed with patient and wife that it would be difficult to evaluate this given the degree of hearing loss that he has.  Discussed with them that in order to create memory, one first has to hear and encode the memory properly the first time.  In addition, I am not sure that we can even do neurocognitive testing on him given his hearing loss.  Wife states that they have tried multiple hearing aids and he does not hear with any of them. ? -We did discuss the importance of mental and physical exercise. ?Subjective:  ? ?Francisco Leon was seen today in the movement disorders clinic for neurologic consultation at the request of Collene Leyden, MD.  The consultation is for the evaluation of balance issues, shuffling gait and to rule out Parkinson's.  Medical records made available to me are reviewed. ? ? ?Specific Symptoms:  ?Tremor: a little shaking in the hands with holding them out for years ?Family hx of similar:  No. ?Voice: no significant change ?Sleep:  ? Vivid Dreams:  No. ?Wet Pillows: No. ?Postural symptoms:  Yes.  , he attributes to age ? Falls?  They state "no falls, but he slides and his legs give way."  Slid getting out of tub.  Slid getting into desk chair and sat down.  2 times where he got to the BR and felt like he was going to fall and he just slowly lowered to the ground.  Last episode 3-4 weeks ago and he felt again like legs were going to give way but he also had trouble siting himself down.  Pt attributes it to age and the fact that he doesn't get much exercise. ?Bradykinesia symptoms: difficulty getting out of a chair; they deny shuffling; rarely uses cane.   Does lots of furniture surfing ?Loss of smell:  Yes.   "I can smell if its strong enough" ?Loss of taste:  No. ?Urinary Incontinence:  wears depends but its never been wet; has urgency ?Difficulty Swallowing:  No. ?Trouble with ADL's:  No. ? Trouble buttoning clothing: No. ?N/V:  No. ?Lightheaded:  No. ? Syncope: No. ?Diplopia:  No. ?Dyskinesia:  No. ? ?Neuroimaging of the brain has not previously been performed.   ? ? ? ?ALLERGIES:   ?Allergies  ?Allergen Reactions  ? Dutasteride Other (See Comments)  ? Lisinopril Other (See Comments)  ? Sulfa Antibiotics Nausea And Vomiting  ? ? ?CURRENT MEDICATIONS:  ?Current Meds  ?Medication Sig  ? atorvastatin (LIPITOR) 10 MG tablet Take 10 mg by mouth at bedtime.  ? EPINEPHrine 0.3 mg/0.3 mL IJ SOAJ injection   ? fluticasone (FLONASE) 50 MCG/ACT nasal spray   ? FREESTYLE LITE test strip 1 each daily.  ? glimepiride (AMARYL) 2 MG tablet Take 2 mg by mouth every morning.  ? glucose blood (FREESTYLE LITE) test strip USE 1 STRIP TO TEST BLOOD SUGAR DAILY  ? Lancets (FREESTYLE) lancets daily.  ? levocetirizine (XYZAL) 5 MG tablet Take 1 tablet by mouth every evening.  ? losartan (COZAAR) 50 MG tablet Take 50 mg by mouth at bedtime.  ? metFORMIN (GLUCOPHAGE) 500 MG tablet SMARTSIG:1 Tablet(s) By  Mouth Every Evening  ? polyethylene glycol powder (GLYCOLAX/MIRALAX) 17 GM/SCOOP powder   ? spironolactone-hydrochlorothiazide (ALDACTAZIDE) 25-25 MG tablet Take 1 tablet by mouth every morning.  ? tamsulosin (FLOMAX) 0.4 MG CAPS capsule   ?  ? ?Objective:  ? ?VITALS:   ?Vitals:  ? 08/26/21 1300  ?BP: 131/78  ?Pulse: 82  ?SpO2: 94%  ?Weight: 183 lb 9.6 oz (83.3 kg)  ?Height: '5\' 6"'$  (1.676 m)  ? ? ?GEN:  The patient appears stated age and is in NAD. ?HEENT:  Normocephalic, atraumatic.  The mucous membranes are moist. The superficial temporal arteries are without ropiness or tenderness. ?CV:  RRR with 2-3/6 SEM ?Lungs:  CTAB ?Neck/HEME:  There are no carotid bruits bilaterally. ? ?Neurological  examination: ? ?Orientation: The patient is alert and oriented to person/place.  Difficult to assess details as he is so [facility] and wife has to assist ?Cranial nerves: There is good facial symmetry. Extraocular muscles are intact. The visual fields are full to confrontational testing. The speech is fluent and clear. Soft palate rises symmetrically and there is no tongue deviation. Hearing is markedly decreased to conversational tone. ?Sensation: Sensation is intact to light and pinprick throughout (facial, trunk, extremities). Vibration is decreased distally. There is no extinction with double simultaneous stimulation. There is no sensory dermatomal level identified. ?Motor: Strength is 5/5 in the bilateral upper and lower extremities.   Shoulder shrug is equal and symmetric.  There is no pronator drift. ?Deep tendon reflexes: Deep tendon reflexes are 1/4 at the bilateral biceps, triceps, brachioradialis, patella and absent at the bilateral achilles. Plantar responses are downgoing bilaterally. ? ?Movement examination: ?Tone: There is nl tone in the bilateral upper extremities.  The tone in the lower extremities is nl.  ?Abnormal movements: none ?Coordination:  There is no decremation with RAM's, with any form of RAMS, including alternating supination and pronation of the forearm, hand opening and closing, finger taps, heel taps and toe taps. ?Gait and Station: The patient pushes off of the chair to arise.  He is not shuffling.  He walks quite fast for me in the hall, but he was slower for my medical assistant.     ?I have reviewed and interpreted the following labs independently ?  Chemistry   ?   ?Component Value Date/Time  ? NA 140 03/17/2008 0400  ? K 2.9 (L) 03/17/2008 0400  ? CL 105 03/17/2008 0400  ? CO2 28 03/17/2008 0400  ? BUN 10 03/17/2008 0400  ? CREATININE 0.88 03/17/2008 0400  ?    ?Component Value Date/Time  ? CALCIUM 9.2 03/17/2008 0400  ? ALKPHOS 68 03/17/2008 0400  ? AST 47 (H) 03/17/2008 0400  ? ALT  74 (H) 03/17/2008 0400  ? BILITOT 1.2 03/17/2008 0400  ?  ? ? ?No results found for: TSH ?Lab Results  ?Component Value Date  ? WBC 10.0 03/17/2008  ? HGB 12.6 (L) 03/17/2008  ? HCT 36.9 (L) 03/17/2008  ? MCV 86.4 03/17/2008  ? PLT 274 03/17/2008  ? ? ? ?Total time spent on today's visit was 60 minutes, including both face-to-face time and nonface-to-face time.  Time included that spent on review of records (prior notes available to me/labs/imaging if pertinent), discussing treatment and goals, answering patient's questions and coordinating care. ? ?Cc:  Collene Leyden, MD ? ?

## 2021-08-26 ENCOUNTER — Encounter: Payer: Self-pay | Admitting: Neurology

## 2021-08-26 ENCOUNTER — Ambulatory Visit (INDEPENDENT_AMBULATORY_CARE_PROVIDER_SITE_OTHER): Payer: Medicare Other | Admitting: Neurology

## 2021-08-26 VITALS — BP 131/78 | HR 82 | Ht 66.0 in | Wt 183.6 lb

## 2021-08-26 DIAGNOSIS — H9193 Unspecified hearing loss, bilateral: Secondary | ICD-10-CM | POA: Diagnosis not present

## 2021-08-26 DIAGNOSIS — E1142 Type 2 diabetes mellitus with diabetic polyneuropathy: Secondary | ICD-10-CM | POA: Diagnosis not present

## 2021-08-26 NOTE — Patient Instructions (Signed)
Let us know if you want a referral for physical therapy. ? ?The physicians and staff at [facility] are committed to providing excellent care. You may receive a survey requesting feedback about your experience at our office. We strive to receive "very good" responses to the survey questions. If you feel that your experience would prevent you from giving the office a "very good " response, please contact our office to try to remedy the situation. We may be reached at [phone]. Thank you for taking the time out of your busy day to complete the survey. ? ?

## 2021-11-21 DIAGNOSIS — R310 Gross hematuria: Secondary | ICD-10-CM | POA: Diagnosis not present

## 2021-11-21 DIAGNOSIS — R3915 Urgency of urination: Secondary | ICD-10-CM | POA: Diagnosis not present

## 2021-11-21 DIAGNOSIS — N401 Enlarged prostate with lower urinary tract symptoms: Secondary | ICD-10-CM | POA: Diagnosis not present

## 2021-12-09 DIAGNOSIS — F411 Generalized anxiety disorder: Secondary | ICD-10-CM | POA: Diagnosis not present

## 2021-12-09 DIAGNOSIS — E1169 Type 2 diabetes mellitus with other specified complication: Secondary | ICD-10-CM | POA: Diagnosis not present

## 2021-12-09 DIAGNOSIS — I10 Essential (primary) hypertension: Secondary | ICD-10-CM | POA: Diagnosis not present

## 2021-12-09 DIAGNOSIS — R413 Other amnesia: Secondary | ICD-10-CM | POA: Diagnosis not present

## 2021-12-09 DIAGNOSIS — R2689 Other abnormalities of gait and mobility: Secondary | ICD-10-CM | POA: Diagnosis not present

## 2021-12-27 DIAGNOSIS — R531 Weakness: Secondary | ICD-10-CM | POA: Diagnosis not present

## 2021-12-30 ENCOUNTER — Other Ambulatory Visit: Payer: Self-pay | Admitting: Family Medicine

## 2021-12-30 DIAGNOSIS — R410 Disorientation, unspecified: Secondary | ICD-10-CM

## 2021-12-30 DIAGNOSIS — R319 Hematuria, unspecified: Secondary | ICD-10-CM | POA: Diagnosis not present

## 2021-12-30 DIAGNOSIS — E1169 Type 2 diabetes mellitus with other specified complication: Secondary | ICD-10-CM | POA: Diagnosis not present

## 2021-12-30 DIAGNOSIS — R531 Weakness: Secondary | ICD-10-CM

## 2021-12-31 ENCOUNTER — Other Ambulatory Visit: Payer: Medicare Other

## 2022-01-03 ENCOUNTER — Ambulatory Visit
Admission: RE | Admit: 2022-01-03 | Discharge: 2022-01-03 | Disposition: A | Discharge status: Home / Self Care | Payer: Medicare Other | Source: Ambulatory Visit | Attending: Family Medicine | Admitting: Family Medicine

## 2022-01-03 DIAGNOSIS — R531 Weakness: Secondary | ICD-10-CM | POA: Diagnosis not present

## 2022-01-03 DIAGNOSIS — R410 Disorientation, unspecified: Secondary | ICD-10-CM | POA: Diagnosis not present

## 2022-01-07 DIAGNOSIS — R3915 Urgency of urination: Secondary | ICD-10-CM | POA: Diagnosis not present

## 2022-01-08 NOTE — Progress Notes (Unsigned)
Assessment/Plan:   1.  Abnormal brain scan  -reviewed brain scan with patient and wife.  Changes are chronic.  there is significant atrophy and white matter disease.  Discussed with him that there is nothing more to be done about this.  Pulled up the films and showed this to them and reviewed it with them.  2.  Probable vascular dementia  -Suspect that the patient may have vascular dementia, that worsened with urinary tract infection.  As we discussed last visit, this is also compounded by the fact that the patient cannot hear and if one cannot hear well, one just does not encode information at all.  Wife asks about memory medications and we talked about them.  Ultimately, we decided to start with memantine (primarily because pulse was already low).  She stated that she was not sure if they were going to pick up the medication, but that they would talk it over.  She requested I send it to the pharmacy, which I did.  Titration schedule was given to work the patient up to 10 mg twice per day.  3.  Follow-up 6 months with our PA who does memory care.    Subjective:   Francisco Leon was seen today in follow up.  I last saw the patient several months ago for gait instability due to probable peripheral neuropathy.  We also discussed memory change, but this really was not able to be evaluated due to the degree of hearing loss that he had.  We discussed that one has to be able to hear in order to encode memory properly.  He follows up today as he apparently had a CT brain ordered by his primary care physician.  I have not received any notes from primary care and his primary care physician is not within the same system that we are.  I called the PCP office to get information but didn't receive a call back.  Patient had CT done September 15.  CT brain was personally reviewed.  He has significant atrophy and small vessel disease.  Wife states that he has a UTI currently.  Wife states that patient had what  she would describe as a spell of confusion and also had trouble walking.  She states that he had something similar in the past.  It started about 2 weeks ago, and they went to the primary care office and initial urine screen was negative.  However, when they went Monday, they were told he might have a urinary tract infection.  She stated that he has had a dramatic increase in urinary frequency (he went 3 times while in my office in the time that I was seeing him) and states that he is going about every 20 minutes.  He is currently on Keflex for this.   CURRENT MEDICATIONS:  Outpatient Encounter Medications as of 01/09/2022  Medication Sig   atorvastatin (LIPITOR) 10 MG tablet Take 10 mg by mouth at bedtime.   azelastine (OPTIVAR) 0.05 % ophthalmic solution  (Patient not taking: Reported on 08/26/2021)   cephALEXin (KEFLEX) 500 MG capsule Take 500 mg by mouth 2 (two) times daily.   EPINEPHrine 0.3 mg/0.3 mL IJ SOAJ injection    fluticasone (FLONASE) 50 MCG/ACT nasal spray    FREESTYLE LITE test strip 1 each daily.   glimepiride (AMARYL) 2 MG tablet Take 2 mg by mouth every morning.   glucose blood (FREESTYLE LITE) test strip USE 1 STRIP TO TEST BLOOD SUGAR DAILY   Lancets (  FREESTYLE) lancets daily.   levocetirizine (XYZAL) 5 MG tablet Take 1 tablet by mouth every evening.   losartan (COZAAR) 50 MG tablet Take 50 mg by mouth at bedtime.   metFORMIN (GLUCOPHAGE) 500 MG tablet SMARTSIG:1 Tablet(s) By Mouth Every Evening   polyethylene glycol powder (GLYCOLAX/MIRALAX) 17 GM/SCOOP powder    spironolactone-hydrochlorothiazide (ALDACTAZIDE) 25-25 MG tablet Take 1 tablet by mouth every morning.   tamsulosin (FLOMAX) 0.4 MG CAPS capsule    No facility-administered encounter medications on file as of 01/09/2022.     Objective:   PHYSICAL EXAMINATION:    VITALS:   Vitals:   01/09/22 1102  BP: 119/82  Pulse: 64  Weight: 183 lb (83 kg)  Height: '5\' 6"'$  (1.676 m)  PF: 96 L/min    GEN:  The patient  appears stated age and is in NAD. HEENT:  Normocephalic, atraumatic.  The mucous membranes are moist. The superficial temporal arteries are without ropiness or tenderness. CV:  RRR Lungs:  CTAB Neck/HEME:  There are no carotid bruits bilaterally.  Neurological examination:  Orientation: The patient is alert and oriented to person/place.  Difficult to assess details as he is so [facility] and wife has to assist.  This is similar to last visit. Cranial nerves: There is good facial symmetry.The speech is fluent and clear. Soft palate rises symmetrically and there is no tongue deviation. Hearing is markedly decreased to conversational tone (rarely hears the examiner). Sensation: Sensation is intact to light touch throughout.  Vibration is decreased distally. Motor: Strength is at least antigravity x4.  Movement examination: Tone: There is nl tone in the bilateral upper extremities.  The tone in the lower extremities is nl.  Abnormal movements: Rare tremor can be felt at rest on the right. Coordination:  There is no decremation with RAM's, with any form of RAMS, including alternating supination and pronation of the forearm, hand opening and closing, finger taps, heel taps and toe taps. Gait and Station: The patient pushes off of the chair to arise.  He is not shuffling.  He is short stepped. He walked both with and without the walker.  While he does better with the walker, he does okay without the walker.    Total time spent on today's visit was 40 minutes, including both face-to-face time and nonface-to-face time.  Time included that spent on review of records (prior notes available to me/labs/imaging if pertinent), discussing treatment and goals, answering patient's questions and coordinating care.  Cc:  Collene Leyden, MD

## 2022-01-09 ENCOUNTER — Ambulatory Visit (INDEPENDENT_AMBULATORY_CARE_PROVIDER_SITE_OTHER): Payer: Medicare Other | Admitting: Neurology

## 2022-01-09 ENCOUNTER — Encounter: Payer: Self-pay | Admitting: Neurology

## 2022-01-09 VITALS — BP 119/82 | HR 64 | Ht 66.0 in | Wt 183.0 lb

## 2022-01-09 DIAGNOSIS — R413 Other amnesia: Secondary | ICD-10-CM

## 2022-01-09 DIAGNOSIS — R9402 Abnormal brain scan: Secondary | ICD-10-CM | POA: Diagnosis not present

## 2022-01-09 DIAGNOSIS — H9193 Unspecified hearing loss, bilateral: Secondary | ICD-10-CM | POA: Diagnosis not present

## 2022-01-09 MED ORDER — MEMANTINE HCL 28 X 5 MG & 21 X 10 MG PO TABS: ORAL_TABLET | ORAL | 0 refills | Status: DC

## 2022-01-09 NOTE — Patient Instructions (Signed)
If you would like to start memantine, you will start: 5 mg once per day x 1 week then 5 mg twice per day x 1 week Then 10 mg in the AM and 5 mg in the PM x 1 week and then 10 mg twice per day  The physicians and staff at [facility] are committed to providing excellent care. You may receive a survey requesting feedback about your experience at our office. We strive to receive "very good" responses to the survey questions. If you feel that your experience would prevent you from giving the office a "very good " response, please contact our office to try to remedy the situation. We may be reached at [phone]. Thank you for taking the time out of your busy day to complete the survey.

## 2022-01-15 ENCOUNTER — Ambulatory Visit: Payer: Medicare Other

## 2022-01-15 DIAGNOSIS — N401 Enlarged prostate with lower urinary tract symptoms: Secondary | ICD-10-CM | POA: Diagnosis not present

## 2022-01-15 DIAGNOSIS — R350 Frequency of micturition: Secondary | ICD-10-CM | POA: Diagnosis not present

## 2022-01-16 ENCOUNTER — Other Ambulatory Visit: Payer: Medicare Other

## 2022-01-23 ENCOUNTER — Other Ambulatory Visit: Payer: Medicare Other

## 2022-01-23 DIAGNOSIS — E871 Hypo-osmolality and hyponatremia: Secondary | ICD-10-CM | POA: Diagnosis not present

## 2022-01-29 DIAGNOSIS — D485 Neoplasm of uncertain behavior of skin: Secondary | ICD-10-CM | POA: Diagnosis not present

## 2022-01-29 DIAGNOSIS — Z85828 Personal history of other malignant neoplasm of skin: Secondary | ICD-10-CM | POA: Diagnosis not present

## 2022-01-29 DIAGNOSIS — C44729 Squamous cell carcinoma of skin of left lower limb, including hip: Secondary | ICD-10-CM | POA: Diagnosis not present

## 2022-01-29 DIAGNOSIS — S60512A Abrasion of left hand, initial encounter: Secondary | ICD-10-CM | POA: Diagnosis not present

## 2022-01-29 DIAGNOSIS — L565 Disseminated superficial actinic porokeratosis (DSAP): Secondary | ICD-10-CM | POA: Diagnosis not present

## 2022-02-12 ENCOUNTER — Emergency Department (HOSPITAL_COMMUNITY): Payer: Medicare Other

## 2022-02-12 ENCOUNTER — Other Ambulatory Visit: Payer: Self-pay

## 2022-02-12 ENCOUNTER — Encounter (HOSPITAL_COMMUNITY): Payer: Self-pay

## 2022-02-12 ENCOUNTER — Inpatient Hospital Stay (HOSPITAL_COMMUNITY)
Admission: EM | Admit: 2022-02-12 | Discharge: 2022-02-25 | DRG: 871 | Disposition: A | Discharge status: Skilled Nursing Facility | Payer: Medicare Other | Attending: Internal Medicine | Admitting: Internal Medicine

## 2022-02-12 DIAGNOSIS — F0390 Unspecified dementia without behavioral disturbance: Secondary | ICD-10-CM | POA: Diagnosis present

## 2022-02-12 DIAGNOSIS — F02A11 Dementia in other diseases classified elsewhere, mild, with agitation: Secondary | ICD-10-CM | POA: Diagnosis not present

## 2022-02-12 DIAGNOSIS — G9341 Metabolic encephalopathy: Secondary | ICD-10-CM | POA: Diagnosis not present

## 2022-02-12 DIAGNOSIS — Z9049 Acquired absence of other specified parts of digestive tract: Secondary | ICD-10-CM

## 2022-02-12 DIAGNOSIS — R410 Disorientation, unspecified: Secondary | ICD-10-CM | POA: Diagnosis not present

## 2022-02-12 DIAGNOSIS — M6281 Muscle weakness (generalized): Secondary | ICD-10-CM | POA: Diagnosis not present

## 2022-02-12 DIAGNOSIS — R338 Other retention of urine: Secondary | ICD-10-CM | POA: Diagnosis not present

## 2022-02-12 DIAGNOSIS — R059 Cough, unspecified: Secondary | ICD-10-CM | POA: Diagnosis not present

## 2022-02-12 DIAGNOSIS — I10 Essential (primary) hypertension: Secondary | ICD-10-CM | POA: Diagnosis present

## 2022-02-12 DIAGNOSIS — R2689 Other abnormalities of gait and mobility: Secondary | ICD-10-CM | POA: Diagnosis not present

## 2022-02-12 DIAGNOSIS — Z87440 Personal history of urinary (tract) infections: Secondary | ICD-10-CM

## 2022-02-12 DIAGNOSIS — N19 Unspecified kidney failure: Secondary | ICD-10-CM | POA: Diagnosis not present

## 2022-02-12 DIAGNOSIS — R310 Gross hematuria: Secondary | ICD-10-CM | POA: Diagnosis present

## 2022-02-12 DIAGNOSIS — A419 Sepsis, unspecified organism: Secondary | ICD-10-CM | POA: Diagnosis not present

## 2022-02-12 DIAGNOSIS — I6523 Occlusion and stenosis of bilateral carotid arteries: Secondary | ICD-10-CM | POA: Diagnosis not present

## 2022-02-12 DIAGNOSIS — D62 Acute posthemorrhagic anemia: Secondary | ICD-10-CM | POA: Diagnosis not present

## 2022-02-12 DIAGNOSIS — I959 Hypotension, unspecified: Secondary | ICD-10-CM | POA: Diagnosis not present

## 2022-02-12 DIAGNOSIS — E7800 Pure hypercholesterolemia, unspecified: Secondary | ICD-10-CM | POA: Diagnosis present

## 2022-02-12 DIAGNOSIS — E870 Hyperosmolality and hypernatremia: Secondary | ICD-10-CM | POA: Diagnosis not present

## 2022-02-12 DIAGNOSIS — Z7189 Other specified counseling: Secondary | ICD-10-CM | POA: Diagnosis not present

## 2022-02-12 DIAGNOSIS — F01A11 Vascular dementia, mild, with agitation: Secondary | ICD-10-CM | POA: Diagnosis present

## 2022-02-12 DIAGNOSIS — F0150 Vascular dementia without behavioral disturbance: Secondary | ICD-10-CM | POA: Diagnosis not present

## 2022-02-12 DIAGNOSIS — F03911 Unspecified dementia, unspecified severity, with agitation: Secondary | ICD-10-CM | POA: Diagnosis not present

## 2022-02-12 DIAGNOSIS — H9190 Unspecified hearing loss, unspecified ear: Secondary | ICD-10-CM | POA: Diagnosis not present

## 2022-02-12 DIAGNOSIS — Z79899 Other long term (current) drug therapy: Secondary | ICD-10-CM | POA: Diagnosis not present

## 2022-02-12 DIAGNOSIS — J90 Pleural effusion, not elsewhere classified: Secondary | ICD-10-CM | POA: Diagnosis not present

## 2022-02-12 DIAGNOSIS — R319 Hematuria, unspecified: Secondary | ICD-10-CM | POA: Diagnosis not present

## 2022-02-12 DIAGNOSIS — R6520 Severe sepsis without septic shock: Secondary | ICD-10-CM | POA: Diagnosis not present

## 2022-02-12 DIAGNOSIS — Z888 Allergy status to other drugs, medicaments and biological substances status: Secondary | ICD-10-CM

## 2022-02-12 DIAGNOSIS — R509 Fever, unspecified: Secondary | ICD-10-CM | POA: Diagnosis not present

## 2022-02-12 DIAGNOSIS — R1312 Dysphagia, oropharyngeal phase: Secondary | ICD-10-CM | POA: Diagnosis not present

## 2022-02-12 DIAGNOSIS — F03918 Unspecified dementia, unspecified severity, with other behavioral disturbance: Secondary | ICD-10-CM | POA: Diagnosis present

## 2022-02-12 DIAGNOSIS — E876 Hypokalemia: Secondary | ICD-10-CM | POA: Diagnosis present

## 2022-02-12 DIAGNOSIS — R29818 Other symptoms and signs involving the nervous system: Secondary | ICD-10-CM | POA: Diagnosis not present

## 2022-02-12 DIAGNOSIS — E1140 Type 2 diabetes mellitus with diabetic neuropathy, unspecified: Secondary | ICD-10-CM | POA: Diagnosis not present

## 2022-02-12 DIAGNOSIS — N136 Pyonephrosis: Secondary | ICD-10-CM | POA: Diagnosis not present

## 2022-02-12 DIAGNOSIS — I700 Atherosclerosis of aorta: Secondary | ICD-10-CM | POA: Diagnosis not present

## 2022-02-12 DIAGNOSIS — R61 Generalized hyperhidrosis: Secondary | ICD-10-CM | POA: Diagnosis not present

## 2022-02-12 DIAGNOSIS — R000 Tachycardia, unspecified: Secondary | ICD-10-CM | POA: Diagnosis not present

## 2022-02-12 DIAGNOSIS — Z1152 Encounter for screening for COVID-19: Secondary | ICD-10-CM

## 2022-02-12 DIAGNOSIS — K5900 Constipation, unspecified: Secondary | ICD-10-CM | POA: Diagnosis not present

## 2022-02-12 DIAGNOSIS — Z8249 Family history of ischemic heart disease and other diseases of the circulatory system: Secondary | ICD-10-CM

## 2022-02-12 DIAGNOSIS — E119 Type 2 diabetes mellitus without complications: Secondary | ICD-10-CM | POA: Diagnosis present

## 2022-02-12 DIAGNOSIS — R41841 Cognitive communication deficit: Secondary | ICD-10-CM | POA: Diagnosis not present

## 2022-02-12 DIAGNOSIS — Z7401 Bed confinement status: Secondary | ICD-10-CM | POA: Diagnosis not present

## 2022-02-12 DIAGNOSIS — N200 Calculus of kidney: Secondary | ICD-10-CM | POA: Diagnosis not present

## 2022-02-12 DIAGNOSIS — R9082 White matter disease, unspecified: Secondary | ICD-10-CM | POA: Diagnosis not present

## 2022-02-12 DIAGNOSIS — Z823 Family history of stroke: Secondary | ICD-10-CM

## 2022-02-12 DIAGNOSIS — Z9181 History of falling: Secondary | ICD-10-CM | POA: Diagnosis not present

## 2022-02-12 DIAGNOSIS — I69828 Other speech and language deficits following other cerebrovascular disease: Secondary | ICD-10-CM | POA: Diagnosis not present

## 2022-02-12 DIAGNOSIS — R4182 Altered mental status, unspecified: Secondary | ICD-10-CM | POA: Diagnosis not present

## 2022-02-12 DIAGNOSIS — Z7984 Long term (current) use of oral hypoglycemic drugs: Secondary | ICD-10-CM

## 2022-02-12 DIAGNOSIS — N179 Acute kidney failure, unspecified: Secondary | ICD-10-CM | POA: Diagnosis not present

## 2022-02-12 DIAGNOSIS — N390 Urinary tract infection, site not specified: Secondary | ICD-10-CM | POA: Diagnosis not present

## 2022-02-12 DIAGNOSIS — Z882 Allergy status to sulfonamides status: Secondary | ICD-10-CM

## 2022-02-12 DIAGNOSIS — A401 Sepsis due to streptococcus, group B: Secondary | ICD-10-CM | POA: Diagnosis not present

## 2022-02-12 DIAGNOSIS — G319 Degenerative disease of nervous system, unspecified: Secondary | ICD-10-CM | POA: Diagnosis not present

## 2022-02-12 DIAGNOSIS — J189 Pneumonia, unspecified organism: Secondary | ICD-10-CM | POA: Diagnosis not present

## 2022-02-12 DIAGNOSIS — F01A2 Vascular dementia, mild, with psychotic disturbance: Secondary | ICD-10-CM | POA: Diagnosis present

## 2022-02-12 DIAGNOSIS — I69891 Dysphagia following other cerebrovascular disease: Secondary | ICD-10-CM | POA: Diagnosis not present

## 2022-02-12 DIAGNOSIS — N401 Enlarged prostate with lower urinary tract symptoms: Secondary | ICD-10-CM | POA: Diagnosis not present

## 2022-02-12 DIAGNOSIS — Z515 Encounter for palliative care: Secondary | ICD-10-CM | POA: Diagnosis not present

## 2022-02-12 DIAGNOSIS — R2681 Unsteadiness on feet: Secondary | ICD-10-CM | POA: Diagnosis not present

## 2022-02-12 DIAGNOSIS — N134 Hydroureter: Secondary | ICD-10-CM | POA: Diagnosis not present

## 2022-02-12 LAB — COMPREHENSIVE METABOLIC PANEL
ALT: 15 U/L (ref 0–44)
AST: 16 U/L (ref 15–41)
Albumin: 3.5 g/dL (ref 3.5–5.0)
Alkaline Phosphatase: 87 U/L (ref 38–126)
Anion gap: 9 (ref 5–15)
BUN: 19 mg/dL (ref 8–23)
CO2: 24 mmol/L (ref 22–32)
Calcium: 10.1 mg/dL (ref 8.9–10.3)
Chloride: 99 mmol/L (ref 98–111)
Creatinine, Ser: 1.1 mg/dL (ref 0.61–1.24)
GFR, Estimated: >60 mL/min (ref >60)
Glucose, Bld: 174 mg/dL — ABNORMAL HIGH (ref 70–99)
Potassium: 3.2 mmol/L — ABNORMAL LOW (ref 3.5–5.1)
Sodium: 132 mmol/L — ABNORMAL LOW (ref 135–145)
Total Bilirubin: 1.5 mg/dL — ABNORMAL HIGH (ref 0.3–1.2)
Total Protein: 7.1 g/dL (ref 6.5–8.1)

## 2022-02-12 LAB — CBC WITH DIFFERENTIAL/PLATELET
Abs Immature Granulocytes: 0.15 10*3/uL — ABNORMAL HIGH (ref 0.00–0.07)
Basophils Absolute: 0 10*3/uL (ref 0.0–0.1)
Basophils Relative: 0 %
Eosinophils Absolute: 0 10*3/uL (ref 0.0–0.5)
Eosinophils Relative: 0 %
HCT: 33.9 % — ABNORMAL LOW (ref 39.0–52.0)
Hemoglobin: 11.1 g/dL — ABNORMAL LOW (ref 13.0–17.0)
Immature Granulocytes: 1 %
Lymphocytes Relative: 4 %
Lymphs Abs: 0.7 10*3/uL (ref 0.7–4.0)
MCH: 28.1 pg (ref 26.0–34.0)
MCHC: 32.7 g/dL (ref 30.0–36.0)
MCV: 85.8 fL (ref 80.0–100.0)
Monocytes Absolute: 1.8 10*3/uL — ABNORMAL HIGH (ref 0.1–1.0)
Monocytes Relative: 9 %
Neutro Abs: 16.6 10*3/uL — ABNORMAL HIGH (ref 1.7–7.7)
Neutrophils Relative %: 86 %
Platelets: 263 10*3/uL (ref 150–400)
RBC: 3.95 MIL/uL — ABNORMAL LOW (ref 4.22–5.81)
RDW: 13.1 % (ref 11.5–15.5)
WBC: 19.2 10*3/uL — ABNORMAL HIGH (ref 4.0–10.5)
nRBC: 0 % (ref 0.0–0.2)

## 2022-02-12 LAB — APTT: aPTT: 22 seconds — ABNORMAL LOW (ref 24–36)

## 2022-02-12 LAB — LACTIC ACID, PLASMA: Lactic Acid, Venous: 1.4 mmol/L (ref 0.5–1.9)

## 2022-02-12 LAB — PROTIME-INR
INR: 1.1 (ref 0.8–1.2)
Prothrombin Time: 13.9 seconds (ref 11.4–15.2)

## 2022-02-12 LAB — RESP PANEL BY RT-PCR (FLU A&B, COVID) ARPGX2
Influenza A by PCR: NEGATIVE
Influenza B by PCR: NEGATIVE
SARS Coronavirus 2 by RT PCR: NEGATIVE

## 2022-02-12 MED ORDER — SODIUM CHLORIDE 0.9 % IV SOLN
2.0000 g | INTRAVENOUS | Status: DC
  Administered 2022-02-12: 2 g via INTRAVENOUS
  Filled 2022-02-12: qty 20

## 2022-02-12 MED ORDER — LACTATED RINGERS IV BOLUS (SEPSIS)
1000.0000 mL | Freq: Once | INTRAVENOUS | Status: AC
  Administered 2022-02-12: 1000 mL via INTRAVENOUS

## 2022-02-12 MED ORDER — HALOPERIDOL LACTATE 5 MG/ML IJ SOLN: 2.0000 mg | Freq: Once | INTRAMUSCULAR | Status: DC

## 2022-02-12 MED ORDER — LORAZEPAM 2 MG/ML IJ SOLN
0.5000 mg | Freq: Once | INTRAMUSCULAR | Status: AC
  Administered 2022-02-12: 0.5 mg via INTRAVENOUS
  Filled 2022-02-12: qty 1

## 2022-02-12 MED ORDER — SODIUM CHLORIDE 0.9 % IV SOLN
500.0000 mg | Freq: Once | INTRAVENOUS | Status: AC
  Administered 2022-02-12: 500 mg via INTRAVENOUS
  Filled 2022-02-12: qty 5

## 2022-02-12 MED ORDER — SODIUM CHLORIDE 0.9 % IV SOLN: 2.0000 g | Freq: Once | INTRAVENOUS | Status: DC

## 2022-02-12 MED ORDER — LACTATED RINGERS IV SOLN: INTRAVENOUS | Status: DC

## 2022-02-12 NOTE — Sepsis Progress Note (Signed)
Elink monitoring for the code sepsis protocol.  

## 2022-02-12 NOTE — ED Triage Notes (Signed)
EMS states family says patient has had hematuria since yesterday.  Dementia at baseline.

## 2022-02-12 NOTE — ED Provider Notes (Signed)
[place] DEPT Provider Note   CSN: [id number] Arrival date & time: 02/12/22  2123     History  Chief Complaint  Patient presents with   Hematuria    Francisco Leon is a 88 y.o. male.  Level 5 caveat due to dementia.  Caregiver brought patient here due to blood in his urine, cough.  He supposed to see urology tomorrow.  History of prostate issues.  History of UTIs.  She was concerned about may be a pneumonia given his cough recently.  Has a history of diabetes, dementia.  History of hearing problems.  History of high cholesterol.  History is limited due to patient not being able to follow commands very well.  The history is provided by a caregiver.       Home Medications Prior to Admission medications   Medication Sig Start Date End Date Taking? Authorizing Provider  atorvastatin (LIPITOR) 10 MG tablet Take 10 mg by mouth at bedtime. 08/05/21   [provider]  azelastine (OPTIVAR) 0.05 % ophthalmic solution  08/02/15   [provider]  cephALEXin (KEFLEX) 500 MG capsule Take 500 mg by mouth 2 (two) times daily. 01/08/22   [provider]  EPINEPHrine 0.3 mg/0.3 mL IJ SOAJ injection  02/19/16   [provider]  fluticasone (FLONASE) 50 MCG/ACT nasal spray  09/18/15   [provider]  FREESTYLE LITE test strip 1 each daily. 08/07/21   [provider]  glimepiride (AMARYL) 2 MG tablet Take 2 mg by mouth every morning. 06/16/21   [provider]  glucose blood (FREESTYLE LITE) test strip USE 1 STRIP TO TEST BLOOD SUGAR DAILY 07/27/15   [provider]  Lancets (FREESTYLE) lancets daily. 07/12/21   [provider]  levocetirizine (XYZAL) 5 MG tablet Take 1 tablet by mouth every evening. 10/01/15   [provider]  losartan (COZAAR) 50 MG tablet Take 50 mg by mouth at bedtime. 08/20/21   [provider]  memantine [facility] TITRATION PACK) tablet pack As directed  on pack 01/09/22   Tat, Eustace Quail, DO  metFORMIN (GLUCOPHAGE) 500 MG tablet SMARTSIG:1 Tablet(s) By Mouth Every Evening 06/08/21   [provider]  polyethylene glycol powder (GLYCOLAX/MIRALAX) 17 GM/SCOOP powder  09/20/15   [provider]  spironolactone-hydrochlorothiazide (ALDACTAZIDE) 25-25 MG tablet Take 1 tablet by mouth every morning. 06/30/21   [provider]  tamsulosin (FLOMAX) 0.4 MG CAPS capsule  08/15/15   [provider]      Allergies    Dutasteride, Lisinopril, and Sulfa antibiotics    Review of Systems   Review of Systems  Physical Exam Updated Vital Signs BP 138/66   Pulse (!) 111   Temp (!) 102 F (38.9 C) (Rectal)   Resp 20   Ht '5\' 6"'$  (1.676 m)   Wt 83 kg   SpO2 91%   BMI 29.53 kg/m  Physical Exam Vitals and nursing note reviewed.  Constitutional:      General: He is not in acute distress.    Appearance: He is well-developed. He is not ill-appearing.  HENT:     Head: Normocephalic and atraumatic.     Nose: Nose normal.     Mouth/Throat:     Mouth: Mucous membranes are moist.  Eyes:     Extraocular Movements: Extraocular movements intact.     Conjunctiva/sclera: Conjunctivae normal.     Pupils: Pupils are equal, round, and reactive to light.  Cardiovascular:     Rate  and Rhythm: Regular rhythm. Tachycardia present.     Pulses: Normal pulses.     Heart sounds: Normal heart sounds. No murmur heard. Pulmonary:     Effort: Pulmonary effort is normal. No respiratory distress.     Breath sounds: Normal breath sounds.  Abdominal:     General: Abdomen is flat.     Palpations: Abdomen is soft.     Tenderness: There is no abdominal tenderness.  Musculoskeletal:        General: No swelling.     Cervical back: Normal range of motion and neck supple.  Skin:    General: Skin is warm and dry.     Capillary Refill: Capillary refill takes less than 2 seconds.  Neurological:     General: No focal deficit present.     Mental  Status: He is alert.  Psychiatric:        Mood and Affect: Mood normal.     ED Results / Procedures / Treatments   Labs (all labs ordered are listed, but only abnormal results are displayed) Labs Reviewed  CBC WITH DIFFERENTIAL/PLATELET - Abnormal; Notable for the following components:      Result Value   WBC 19.2 (*)    RBC 3.95 (*)    Hemoglobin 11.1 (*)    HCT 33.9 (*)    Neutro Abs 16.6 (*)    Monocytes Absolute 1.8 (*)    Abs Immature Granulocytes 0.15 (*)    All other components within normal limits  APTT - Abnormal; Notable for the following components:   aPTT 22 (*)    All other components within normal limits  URINE CULTURE  RESP PANEL BY RT-PCR (FLU A&B, COVID) ARPGX2  CULTURE, BLOOD (ROUTINE X 2)  CULTURE, BLOOD (ROUTINE X 2)  PROTIME-INR  URINALYSIS, ROUTINE W REFLEX MICROSCOPIC  LACTIC ACID, PLASMA  LACTIC ACID, PLASMA  COMPREHENSIVE METABOLIC PANEL    EKG EKG Interpretation  Date/Time:  Wednesday February 12 2022 22:13:51 EDT Ventricular Rate:  114 PR Interval:  212 QRS Duration: 97 QT Interval:  292 QTC Calculation: 402 R Axis:   14 Text Interpretation: Sinus tachycardia Ventricular premature complex Borderline prolonged PR interval Confirmed by Lennice Sites ([id number]) on 02/12/2022 10:20:11 PM  Radiology DG Chest Port 1 View  Result Date: 02/12/2022 CLINICAL DATA:  Hematuria and possible sepsis, initial encounter EXAM: PORTABLE CHEST 1 VIEW COMPARISON:  11/25/2010 FINDINGS: Cardiac shadow is within normal limits. Left basilar airspace opacity is noted with small effusion. No bony abnormality is noted. IMPRESSION: Left basilar airspace opacity with small effusion. Electronically Signed   By: Inez Catalina M.D.   On: 02/12/2022 22:32    Procedures .Critical Care  Performed by: Lennice Sites, DO Authorized by: Lennice Sites, DO   Critical care provider statement:    Critical care time (minutes):  45   Critical care was necessary to treat or prevent  imminent or life-threatening deterioration of the following conditions:  Sepsis   Critical care was time spent personally by me on the following activities:  Blood draw for specimens, development of treatment plan with patient or surrogate, discussions with consultants, examination of patient, evaluation of patient's response to treatment, obtaining history from patient or surrogate, ordering and performing treatments and interventions, ordering and review of laboratory studies, ordering and review of radiographic studies, pulse oximetry, re-evaluation of patient's condition and review of old charts   Care discussed with: admitting provider       Medications Ordered in ED Medications  lactated  ringers infusion (has no administration in time range)  lactated ringers bolus 1,000 mL (1,000 mLs Intravenous New Bag/Given 02/12/22 2220)  cefTRIAXone (ROCEPHIN) 2 g in sodium chloride 0.9 % 100 mL IVPB (2 g Intravenous New Bag/Given 02/12/22 2225)  azithromycin (ZITHROMAX) 500 mg in sodium chloride 0.9 % 250 mL IVPB (has no administration in time range)    ED Course/ Medical Decision Making/ A&P                           Medical Decision Making Amount and/or Complexity of Data Reviewed Labs: ordered. Radiology: ordered. ECG/medicine tests: ordered.  Risk Prescription drug management.   JERY HOLLERN is here with blood in his urine, cough.  Patient found to be febrile, tachycardic.  Code sepsis initiated.  Source likely pulmonary versus urinary.  He has no abdominal tenderness on exam.  He appears to be at his neurologic baseline.  Broad-spectrum IV antibiotics started including IV fluid bolus.  We will get CBC, CMP, urinalysis, chest x-ray, blood cultures, COVID and flu testing.  Bladder scan was performed that showed less than 50 cc of urine.  Doubt urinary retention.  Hematuria history but usually associated with infection.  Does not appear to be any discomfort in his abdomen or flank.  Have  no concern for kidney stone at this time.  Per my review and interpretation of chest x-ray appears to be pneumonia.  Patient per my review and interpretation labs with leukocytosis of 19.  Overall suspicion for pneumonia causing sepsis.  Still possibly could also be UTI.  Urine is pending.  EKG per my review and interpretation shows sinus rhythm.  No ischemic changes.  Will admit for further sepsis care.  This chart was dictated using voice recognition software.  Despite best efforts to proofread,  errors can occur which can change the documentation meaning.         Final Clinical Impression(s) / ED Diagnoses Final diagnoses:  Sepsis, due to unspecified organism, unspecified whether acute organ dysfunction present [facility])  Community acquired pneumonia, unspecified laterality    Rx / DC Orders ED Discharge Orders     None         Lennice Sites, DO 02/12/22 2245

## 2022-02-13 ENCOUNTER — Telehealth: Payer: Self-pay | Admitting: Neurology

## 2022-02-13 ENCOUNTER — Encounter (HOSPITAL_COMMUNITY): Payer: Self-pay | Admitting: Family Medicine

## 2022-02-13 DIAGNOSIS — F0150 Vascular dementia without behavioral disturbance: Secondary | ICD-10-CM | POA: Diagnosis not present

## 2022-02-13 DIAGNOSIS — F03918 Unspecified dementia, unspecified severity, with other behavioral disturbance: Secondary | ICD-10-CM | POA: Diagnosis present

## 2022-02-13 DIAGNOSIS — E7800 Pure hypercholesterolemia, unspecified: Secondary | ICD-10-CM | POA: Diagnosis present

## 2022-02-13 DIAGNOSIS — J189 Pneumonia, unspecified organism: Secondary | ICD-10-CM | POA: Diagnosis not present

## 2022-02-13 DIAGNOSIS — E876 Hypokalemia: Secondary | ICD-10-CM | POA: Diagnosis present

## 2022-02-13 DIAGNOSIS — E119 Type 2 diabetes mellitus without complications: Secondary | ICD-10-CM | POA: Diagnosis not present

## 2022-02-13 DIAGNOSIS — R319 Hematuria, unspecified: Secondary | ICD-10-CM | POA: Diagnosis present

## 2022-02-13 DIAGNOSIS — F0390 Unspecified dementia without behavioral disturbance: Secondary | ICD-10-CM | POA: Diagnosis present

## 2022-02-13 DIAGNOSIS — I10 Essential (primary) hypertension: Secondary | ICD-10-CM | POA: Diagnosis present

## 2022-02-13 DIAGNOSIS — A419 Sepsis, unspecified organism: Secondary | ICD-10-CM | POA: Diagnosis not present

## 2022-02-13 LAB — URINALYSIS, ROUTINE W REFLEX MICROSCOPIC
Bacteria, UA: NONE SEEN
Bilirubin Urine: NEGATIVE
Glucose, UA: NEGATIVE mg/dL
Ketones, ur: NEGATIVE mg/dL
Nitrite: NEGATIVE
Protein, ur: 100 mg/dL — AB
RBC / HPF: >50 RBC/hpf — ABNORMAL HIGH (ref 0–5)
Specific Gravity, Urine: 1.012 (ref 1.005–1.030)
WBC, UA: >50 WBC/hpf — ABNORMAL HIGH (ref 0–5)
pH: 6 (ref 5.0–8.0)

## 2022-02-13 LAB — PROCALCITONIN: Procalcitonin: <0.1 ng/mL

## 2022-02-13 LAB — BASIC METABOLIC PANEL
Anion gap: 5 (ref 5–15)
BUN: 17 mg/dL (ref 8–23)
CO2: 21 mmol/L — ABNORMAL LOW (ref 22–32)
Calcium: 8.3 mg/dL — ABNORMAL LOW (ref 8.9–10.3)
Chloride: 108 mmol/L (ref 98–111)
Creatinine, Ser: 1 mg/dL (ref 0.61–1.24)
GFR, Estimated: >60 mL/min (ref >60)
Glucose, Bld: 142 mg/dL — ABNORMAL HIGH (ref 70–99)
Potassium: 2.8 mmol/L — ABNORMAL LOW (ref 3.5–5.1)
Sodium: 134 mmol/L — ABNORMAL LOW (ref 135–145)

## 2022-02-13 LAB — GLUCOSE, CAPILLARY
Glucose-Capillary: 177 mg/dL — ABNORMAL HIGH (ref 70–99)
Glucose-Capillary: 174 mg/dL — ABNORMAL HIGH (ref 70–99)

## 2022-02-13 LAB — CBC
HCT: 29.4 % — ABNORMAL LOW (ref 39.0–52.0)
Hemoglobin: 9.7 g/dL — ABNORMAL LOW (ref 13.0–17.0)
MCH: 28.3 pg (ref 26.0–34.0)
MCHC: 33 g/dL (ref 30.0–36.0)
MCV: 85.7 fL (ref 80.0–100.0)
Platelets: 235 10*3/uL (ref 150–400)
RBC: 3.43 MIL/uL — ABNORMAL LOW (ref 4.22–5.81)
RDW: 13.2 % (ref 11.5–15.5)
WBC: 11.8 10*3/uL — ABNORMAL HIGH (ref 4.0–10.5)
nRBC: 0 % (ref 0.0–0.2)

## 2022-02-13 LAB — HEMOGLOBIN A1C
Hgb A1c MFr Bld: 6.5 % — ABNORMAL HIGH (ref 4.8–5.6)
Mean Plasma Glucose: 139.85 mg/dL

## 2022-02-13 LAB — HEPATIC FUNCTION PANEL
ALT: 13 U/L (ref 0–44)
AST: 14 U/L — ABNORMAL LOW (ref 15–41)
Albumin: 2.5 g/dL — ABNORMAL LOW (ref 3.5–5.0)
Alkaline Phosphatase: 63 U/L (ref 38–126)
Bilirubin, Direct: 0.2 mg/dL (ref 0.0–0.2)
Indirect Bilirubin: 0.9 mg/dL (ref 0.3–0.9)
Total Bilirubin: 1.1 mg/dL (ref 0.3–1.2)
Total Protein: 5.2 g/dL — ABNORMAL LOW (ref 6.5–8.1)

## 2022-02-13 LAB — CBG MONITORING, ED
Glucose-Capillary: 134 mg/dL — ABNORMAL HIGH (ref 70–99)
Glucose-Capillary: 147 mg/dL — ABNORMAL HIGH (ref 70–99)
Glucose-Capillary: 233 mg/dL — ABNORMAL HIGH (ref 70–99)

## 2022-02-13 LAB — MAGNESIUM: Magnesium: 1.4 mg/dL — ABNORMAL LOW (ref 1.7–2.4)

## 2022-02-13 MED ORDER — ORAL CARE MOUTH RINSE
15.0000 mL | OROMUCOSAL | Status: DC
  Administered 2022-02-13 – 2022-02-25 (×41): 15 mL via OROMUCOSAL

## 2022-02-13 MED ORDER — LOSARTAN POTASSIUM 50 MG PO TABS
50.0000 mg | ORAL_TABLET | Freq: Every day | ORAL | Status: DC
  Administered 2022-02-13: 50 mg via ORAL
  Filled 2022-02-13: qty 1

## 2022-02-13 MED ORDER — LORAZEPAM 2 MG/ML IJ SOLN
0.5000 mg | Freq: Once | INTRAMUSCULAR | Status: AC
  Administered 2022-02-13: 0.5 mg via INTRAVENOUS
  Filled 2022-02-13: qty 1

## 2022-02-13 MED ORDER — INSULIN ASPART 100 UNIT/ML IJ SOLN
0.0000 [IU] | Freq: Three times a day (TID) | INTRAMUSCULAR | Status: DC
  Administered 2022-02-13: 1 [IU] via SUBCUTANEOUS
  Administered 2022-02-13: 2 [IU] via SUBCUTANEOUS
  Administered 2022-02-14: 1 [IU] via SUBCUTANEOUS
  Administered 2022-02-15: 2 [IU] via SUBCUTANEOUS
  Administered 2022-02-15 – 2022-02-19 (×3): 1 [IU] via SUBCUTANEOUS
  Administered 2022-02-20: 2 [IU] via SUBCUTANEOUS
  Administered 2022-02-20 – 2022-02-25 (×10): 1 [IU] via SUBCUTANEOUS
  Filled 2022-02-13: qty 0.06

## 2022-02-13 MED ORDER — ENOXAPARIN SODIUM 40 MG/0.4ML IJ SOSY: 40.0000 mg | PREFILLED_SYRINGE | INTRAMUSCULAR | Status: DC

## 2022-02-13 MED ORDER — POTASSIUM CHLORIDE CRYS ER 20 MEQ PO TBCR
40.0000 meq | EXTENDED_RELEASE_TABLET | Freq: Once | ORAL | Status: DC
  Filled 2022-02-13: qty 2

## 2022-02-13 MED ORDER — ATORVASTATIN CALCIUM 10 MG PO TABS
10.0000 mg | ORAL_TABLET | Freq: Every day | ORAL | Status: DC
  Administered 2022-02-13 – 2022-02-24 (×12): 10 mg via ORAL
  Filled 2022-02-13 (×12): qty 1

## 2022-02-13 MED ORDER — OLANZAPINE 10 MG IM SOLR
5.0000 mg | Freq: Once | INTRAMUSCULAR | Status: AC | PRN
  Administered 2022-02-13: 5 mg via INTRAMUSCULAR
  Filled 2022-02-13: qty 10

## 2022-02-13 MED ORDER — INSULIN ASPART 100 UNIT/ML IJ SOLN
0.0000 [IU] | Freq: Every day | INTRAMUSCULAR | Status: DC
  Filled 2022-02-13: qty 0.05

## 2022-02-13 MED ORDER — STERILE WATER FOR INJECTION IJ SOLN
INTRAMUSCULAR | Status: AC
  Filled 2022-02-13: qty 10

## 2022-02-13 MED ORDER — POTASSIUM CHLORIDE 10 MEQ/100ML IV SOLN
10.0000 meq | INTRAVENOUS | Status: AC
  Administered 2022-02-13 (×4): 10 meq via INTRAVENOUS
  Filled 2022-02-13 (×4): qty 100

## 2022-02-13 MED ORDER — MAGNESIUM SULFATE 2 GM/50ML IV SOLN
2.0000 g | Freq: Once | INTRAVENOUS | Status: AC
  Administered 2022-02-13: 2 g via INTRAVENOUS
  Filled 2022-02-13: qty 50

## 2022-02-13 MED ORDER — SENNOSIDES-DOCUSATE SODIUM 8.6-50 MG PO TABS: 1.0000 | ORAL_TABLET | Freq: Every evening | ORAL | Status: DC | PRN

## 2022-02-13 MED ORDER — SODIUM CHLORIDE 0.9% FLUSH
3.0000 mL | Freq: Two times a day (BID) | INTRAVENOUS | Status: DC
  Administered 2022-02-13 – 2022-02-24 (×25): 3 mL via INTRAVENOUS

## 2022-02-13 MED ORDER — BACLOFEN 10 MG PO TABS
5.0000 mg | ORAL_TABLET | Freq: Three times a day (TID) | ORAL | Status: DC
  Administered 2022-02-13 – 2022-02-17 (×5): 5 mg via ORAL
  Filled 2022-02-13 (×6): qty 1

## 2022-02-13 MED ORDER — ONDANSETRON HCL 4 MG PO TABS: 4.0000 mg | ORAL_TABLET | Freq: Four times a day (QID) | ORAL | Status: DC | PRN

## 2022-02-13 MED ORDER — TAMSULOSIN HCL 0.4 MG PO CAPS
0.8000 mg | ORAL_CAPSULE | Freq: Every day | ORAL | Status: DC
  Administered 2022-02-13 – 2022-02-17 (×2): 0.8 mg via ORAL
  Filled 2022-02-13 (×3): qty 2

## 2022-02-13 MED ORDER — ESCITALOPRAM OXALATE 10 MG PO TABS
10.0000 mg | ORAL_TABLET | Freq: Every day | ORAL | Status: DC
  Administered 2022-02-13 – 2022-02-24 (×9): 10 mg via ORAL
  Filled 2022-02-13 (×10): qty 1

## 2022-02-13 MED ORDER — ACETAMINOPHEN 650 MG RE SUPP
650.0000 mg | Freq: Four times a day (QID) | RECTAL | Status: DC | PRN
  Administered 2022-02-13: 650 mg via RECTAL
  Filled 2022-02-13: qty 1

## 2022-02-13 MED ORDER — ORAL CARE MOUTH RINSE: 15.0000 mL | OROMUCOSAL | Status: DC | PRN

## 2022-02-13 MED ORDER — SODIUM CHLORIDE 0.9 % IV SOLN
2.0000 g | INTRAVENOUS | Status: AC
  Administered 2022-02-13 – 2022-02-16 (×4): 2 g via INTRAVENOUS
  Filled 2022-02-13 (×4): qty 20

## 2022-02-13 MED ORDER — LIP MEDEX EX OINT
TOPICAL_OINTMENT | Freq: Once | CUTANEOUS | Status: AC
  Administered 2022-02-13: 1 via TOPICAL
  Filled 2022-02-13: qty 7

## 2022-02-13 MED ORDER — SODIUM CHLORIDE (HYPERTONIC) 2 % OP SOLN
1.0000 [drp] | Freq: Every day | OPHTHALMIC | Status: DC
  Administered 2022-02-13 – 2022-02-24 (×12): 1 [drp] via OPHTHALMIC
  Filled 2022-02-13: qty 15

## 2022-02-13 MED ORDER — ACETAMINOPHEN 325 MG PO TABS
650.0000 mg | ORAL_TABLET | Freq: Four times a day (QID) | ORAL | Status: DC | PRN
  Filled 2022-02-13: qty 2

## 2022-02-13 MED ORDER — AZITHROMYCIN 250 MG PO TABS
500.0000 mg | ORAL_TABLET | Freq: Every day | ORAL | Status: AC
  Administered 2022-02-13 – 2022-02-16 (×4): 500 mg via ORAL
  Filled 2022-02-13 (×4): qty 2

## 2022-02-13 MED ORDER — MEMANTINE HCL 10 MG PO TABS
10.0000 mg | ORAL_TABLET | Freq: Two times a day (BID) | ORAL | Status: DC
  Administered 2022-02-13 – 2022-02-25 (×22): 10 mg via ORAL
  Filled 2022-02-13 (×19): qty 1
  Filled 2022-02-13: qty 2
  Filled 2022-02-13 (×3): qty 1

## 2022-02-13 MED ORDER — ONDANSETRON HCL 4 MG/2ML IJ SOLN
4.0000 mg | Freq: Four times a day (QID) | INTRAMUSCULAR | Status: DC | PRN
  Administered 2022-02-13: 4 mg via INTRAVENOUS
  Filled 2022-02-13: qty 2

## 2022-02-13 NOTE — Telephone Encounter (Signed)
Patient wife advised, We will have to defer to the hospital physicians for this, as they are most aware of his acute needs.

## 2022-02-13 NOTE — Plan of Care (Signed)

## 2022-02-13 NOTE — Telephone Encounter (Signed)
Pt's wife called in stating the pt is currently in the hospital and he has very bad anxiety. She says they gave him Atavan at 10:30 PM last night. They then gave him Zypresa this morning, but they read online not to give this drug for dementia or elderly patients. She would like to see what Dr. Carles Collet might recommend?

## 2022-02-13 NOTE — Progress Notes (Signed)
PROGRESS NOTE    Francisco Leon  YVO:[id number] DOB: [birth year] DOA: 02/12/2022 PCP: Collene Leyden, MD    Brief Narrative:  Francisco Leon is a 88 y.o. male with past medical history of hypertension, hyperlipidemia, type 2 diabetes, vascular dementia, hearing loss, BPH presented to hospital with cough and hematuria worsened by for 1 day started 4 days prior to presentation.  He was also having gross hematuria and was supposed to follow-up with urology as outpatient.  In the ED, patient was febrile and mildly hypoxic with tachycardia.  EKG showed sinus tachycardia.  Chest x-ray showed left basilar pneumonia.  Patient was noted to have hypokalemia and leukocytosis.  Lactate was normal.  COVID and influenza PCR negative.  Urine showed moderate 50 RBC/hpf but no bacteria seen.  Blood cultures were collected from the ED patient was given a Rocephin Zithromax and was admitted hospital for further evaluation and treatment.  Assessment/Plan   Principal Problem:   Sepsis due to pneumonia [facility] Active Problems:   Diabetes mellitus without complication ([place])   Hypertension   Pure hypercholesterolemia   Dementia ([place])   Hypokalemia   Hematuria   Sepsis due to pneumonia. Patient presented with cough fever, tachycardia leukocytosis and chest x-ray concerning for pneumonia.  Follow-up blood cultures.  Continue Rocephin and Zithromax.  Pending strep pneumo and legionella antigens, procalcitonin, follow blood cultures.  Lactate was 1.4.  COVID and influenza negative.  Albumin level was low at 2.5.  Respiratory panel negative for COVID and influenza.  Urine culture pending   Hematuria. Patient was was scheduled to see urology 02/13/22.  No bacteria on urine microscopy.  Has episodes of intermittent hematuria due to prostatic enlargement.  Will need outpatient urology follow-up on discharge.  Type II DM  Continue sliding scale insulin Accu-Cheks.  We will continue to monitor blood glucose levels.    Essential hypertension  Continue losartan.  Diuretics on hold.   Hyperlipidemia - Continue Lipitor    Confusion disorientation and agitation on the background of dementia  - Delirium precautions, continue Namenda   .  Patient's wife at bedside.  Explained delirium precautions.  We will get palliative care consultation.      DVT prophylaxis: SCDs Start: 02/13/22 0047   Code Status:     Code Status: Full Code  Disposition: Home likely in 1 to 2 days we will get PT evaluation in a.m.  Status is: Inpatient  Remains inpatient appropriate because: Sepsis secondary pneumonia, IV antibiotic, confusion disorientation   Family Communication:  Spoke with the patient's wife at bedside.  Patient lives with his wife at home.  Patient's wife states that he has been having declining mental condition recently.  Consultants:  Palliative care  Procedures:  None  Antimicrobials:  Rocephin and Zithromax IV.  Anti-infectives (From admission, onward)    Start     Dose/Rate Route Frequency Ordered Stop   02/13/22 2200  cefTRIAXone (ROCEPHIN) 2 g in sodium chloride 0.9 % 100 mL IVPB        2 g 200 mL/hr over 30 Minutes Intravenous Every 24 hours 02/13/22 0039 02/17/22 2159   02/13/22 2200  azithromycin (ZITHROMAX) tablet 500 mg        500 mg Oral Daily at bedtime 02/13/22 0039 02/17/22 2159   02/12/22 2215  ceFEPIme (MAXIPIME) 2 g in sodium chloride 0.9 % 100 mL IVPB  Status:  Discontinued        2 g 200 mL/hr over 30 Minutes Intravenous  Once 02/12/22 2201  02/12/22 2201   02/12/22 2215  cefTRIAXone (ROCEPHIN) 2 g in sodium chloride 0.9 % 100 mL IVPB  Status:  Discontinued        2 g 200 mL/hr over 30 Minutes Intravenous Every 24 hours 02/12/22 2202 02/13/22 0040   02/12/22 2215  azithromycin (ZITHROMAX) 500 mg in sodium chloride 0.9 % 250 mL IVPB        500 mg 250 mL/hr over 60 Minutes Intravenous  Once 02/12/22 2204 02/13/22 0053       Subjective: Today, patient was seen and  examined at bedside.  Appears to be little agitated.  Difficult to comprehend speech.  Patient's wife at bedside.  Objective: Vitals:   02/13/22 0830 02/13/22 0845 02/13/22 0900 02/13/22 1027  BP: 133/71  125/66 138/84  Pulse: 80  77 82  Resp:  18 20 (!) 22  Temp:    98.8 F (37.1 C)  TempSrc:    Oral  SpO2: 96%  95% 97%  Weight:      Height:        Intake/Output Summary (Last 24 hours) at 02/13/2022 1203 Last data filed at 02/13/2022 1150 Gross per 24 hour  Intake 332.5 ml  Output --  Net 332.5 ml   Filed Weights   02/12/22 2138  Weight: 83 kg    Physical Examination: Body mass index is 29.53 kg/m.  General:  Average built, not in obvious distress, mildly agitated at times HENT:   No scleral pallor or icterus noted. Oral mucosa is moist.  Chest:  Clear breath sounds.  Diminished breath sounds bilaterally. No crackles or wheezes.  CVS: S1 &S2 heard. No murmur.  Regular rate and rhythm. Abdomen: Soft, specific tenderness on palpation but tympanic abdomen,, nondistended.  Bowel sounds are heard.   Extremities: No cyanosis, clubbing or edema.  Peripheral pulses are palpable. Psych: Alert, awake but confused and disoriented, has underlying dementia, CNS: .  Moves all extremities Skin: Warm and dry.  No rashes noted.  Data Reviewed:   CBC: Recent Labs  Lab 02/12/22 2210 02/13/22 0407  WBC 19.2* 11.8*  NEUTROABS 16.6*  --   HGB 11.1* 9.7*  HCT 33.9* 29.4*  MCV 85.8 85.7  PLT 263 308    Basic Metabolic Panel: Recent Labs  Lab 02/12/22 2210 02/13/22 0407  NA 132* 134*  K 3.2* 2.8*  CL 99 108  CO2 24 21*  GLUCOSE 174* 142*  BUN 19 17  CREATININE 1.10 1.00  CALCIUM 10.1 8.3*  MG  --  1.4*    Liver Function Tests: Recent Labs  Lab 02/12/22 2210 02/13/22 0407  AST 16 14*  ALT 15 13  ALKPHOS 87 63  BILITOT 1.5* 1.1  PROT 7.1 5.2*  ALBUMIN 3.5 2.5*     Radiology Studies: DG Chest Port 1 View  Result Date: 02/12/2022 CLINICAL DATA:  Hematuria  and possible sepsis, initial encounter EXAM: PORTABLE CHEST 1 VIEW COMPARISON:  11/25/2010 FINDINGS: Cardiac shadow is within normal limits. Left basilar airspace opacity is noted with small effusion. No bony abnormality is noted. IMPRESSION: Left basilar airspace opacity with small effusion. Electronically Signed   By: Inez Catalina M.D.   On: 02/12/2022 22:32      LOS: 1 day    Flora Lipps, MD [facility] Available via Epic secure chat 7am-7pm After these hours, please refer to coverage provider listed on [email or web address] 02/13/2022, 12:03 PM

## 2022-02-13 NOTE — H&P (Signed)
History and Physical    Francisco Leon DGL:[id number] DOB: [birth year] DOA: 02/12/2022  PCP: Collene Leyden, MD   Patient coming from: Home   Chief Complaint: Cough, hematuria   HPI: Francisco Leon is a 88 y.o. male with medical history significant for hypertension, hyperlipidemia, type 2 diabetes mellitus, suspected vascular dementia, hearing loss, and BPH who presents to the emergency department for evaluation of cough and hematuria.  He developed a cough the night of 02/09/2022 which worsened today and his wife was concerned that he had pneumonia.   Patient has also been dealing with gross hematuria and was scheduled to see urology for this tomorrow. His wife reports that he gets this frequently but it seemed to be worse than usual.   ED Course: Upon arrival to the ED, patient is found to be febrile to 38.9 C and saturating low 90s on room air with elevated heart rate and stable blood pressure.  EKG features sinus tachycardia and chest x-ray concerning for left basilar pneumonia.  Chemistry panel notable for potassium 3.2 and bilirubin 1.5.  CBC features a leukocytosis of 19,200 and a mild normocytic anemia.  Lactic acid is reassuringly normal.  COVID and influenza PCR negative.  Urine is turbid with large hemoglobin on dipstick and microscopy with > 50 RBC/hpf but no bacteria seen.  Blood cultures were collected and the patient was given a liter of LR, IV Ativan, Rocephin, and azithromycin in the ED.  Review of Systems:  ROS limited by patient's clinical condition.  Past Medical History:  Diagnosis Date   Allergic rhinitis    Balance disorder    BPH (benign prostatic hyperplasia)    Diabetes mellitus without complication (HCC)    Elevated cholesterol    Generalized edema    Hearing loss    Hypertension    Pure hypercholesterolemia    PVC (premature ventricular contraction)     Past Surgical History:  Procedure Laterality Date   CHOLECYSTECTOMY  2009   TONSILLECTOMY       Social History:   reports that he has never smoked. He has never used smokeless tobacco. He reports that he does not currently use alcohol. No history on file for drug use.  Allergies  Allergen Reactions   Dutasteride Other (See Comments)   Lisinopril Other (See Comments)   Sulfa Antibiotics Nausea And Vomiting    Family History  Problem Relation Age of Onset   CVA Mother    Heart attack Father    CVA Brother      Prior to Admission medications   Medication Sig Start Date End Date Taking? Authorizing Provider  atorvastatin (LIPITOR) 10 MG tablet Take 10 mg by mouth at bedtime. 08/05/21   [provider]  azelastine (OPTIVAR) 0.05 % ophthalmic solution  08/02/15   [provider]  cephALEXin (KEFLEX) 500 MG capsule Take 500 mg by mouth 2 (two) times daily. 01/08/22   [provider]  EPINEPHrine 0.3 mg/0.3 mL IJ SOAJ injection  02/19/16   [provider]  fluticasone (FLONASE) 50 MCG/ACT nasal spray  09/18/15   [provider]  FREESTYLE LITE test strip 1 each daily. 08/07/21   [provider]  glimepiride (AMARYL) 2 MG tablet Take 2 mg by mouth every morning. 06/16/21   [provider]  glucose blood (FREESTYLE LITE) test strip USE 1 STRIP TO TEST BLOOD SUGAR DAILY 07/27/15   [provider]  Lancets (FREESTYLE) lancets daily. 07/12/21   [provider]  levocetirizine Harlow Ohms)  5 MG tablet Take 1 tablet by mouth every evening. 10/01/15   [provider]  losartan (COZAAR) 50 MG tablet Take 50 mg by mouth at bedtime. 08/20/21   [provider]  memantine [facility] TITRATION PACK) tablet pack As directed on pack 01/09/22   Tat, Eustace Quail, DO  metFORMIN (GLUCOPHAGE) 500 MG tablet SMARTSIG:1 Tablet(s) By Mouth Every Evening 06/08/21   [provider]  polyethylene glycol powder (GLYCOLAX/MIRALAX) 17 GM/SCOOP powder  09/20/15   [provider]  spironolactone-hydrochlorothiazide  (ALDACTAZIDE) 25-25 MG tablet Take 1 tablet by mouth every morning. 06/30/21   [provider]  tamsulosin (FLOMAX) 0.4 MG CAPS capsule  08/15/15   [provider]    Physical Exam: Vitals:   02/12/22 2138 02/12/22 2151 02/12/22 2200 02/12/22 2215  BP:  135/72 (!) 142/75 138/66  Pulse:  (!) 120 (!) 120 (!) 111  Resp:  '18 19 20  '$ Temp:  (!) 102 F (38.9 C)    TempSrc:  Rectal    SpO2:  92% 93% 91%  Weight: 83 kg     Height: '5\' 6"'$  (1.676 m)       Constitutional: NAD, no pallor or diaphoresis  Eyes: PERTLA, lids and conjunctivae normal ENMT: Mucous membranes are moist. Posterior pharynx clear of any exudate or lesions.   Neck: supple, no masses  Respiratory: no wheezing. Rhonchi on left. No accessory muscle use.  Cardiovascular: S1 & S2 heard, regular rate and rhythm. No JVD.  Abdomen: No distension, no tenderness, soft. Bowel sounds active.  Musculoskeletal: no clubbing / cyanosis. No joint deformity upper and lower extremities.   Skin: no significant rashes, lesions, ulcers. Warm, dry, well-perfused. Neurologic: Gross hearing deficit. Moving all extremities.   Psychiatric: Calm. Cooperative.    Labs and Imaging on Admission: I have personally reviewed following labs and imaging studies  CBC: Recent Labs  Lab 02/12/22 2210  WBC 19.2*  NEUTROABS 16.6*  HGB 11.1*  HCT 33.9*  MCV 85.8  PLT 627   Basic Metabolic Panel: Recent Labs  Lab 02/12/22 2210  NA 132*  K 3.2*  CL 99  CO2 24  GLUCOSE 174*  BUN 19  CREATININE 1.10  CALCIUM 10.1   GFR: Estimated Creatinine Clearance: 46.9 mL/min (by C-G formula based on SCr of 1.1 mg/dL). Liver Function Tests: Recent Labs  Lab 02/12/22 2210  AST 16  ALT 15  ALKPHOS 87  BILITOT 1.5*  PROT 7.1  ALBUMIN 3.5   No results for input(s): "LIPASE", "AMYLASE" in the last 168 hours. No results for input(s): "AMMONIA" in the last 168 hours. Coagulation Profile: Recent Labs  Lab 02/12/22 2210  INR 1.1    Cardiac Enzymes: No results for input(s): "CKTOTAL", "CKMB", "CKMBINDEX", "TROPONINI" in the last 168 hours. BNP (last 3 results) No results for input(s): "PROBNP" in the last 8760 hours. HbA1C: No results for input(s): "HGBA1C" in the last 72 hours. CBG: No results for input(s): "GLUCAP" in the last 168 hours. Lipid Profile: No results for input(s): "CHOL", "HDL", "LDLCALC", "TRIG", "CHOLHDL", "LDLDIRECT" in the last 72 hours. Thyroid Function Tests: No results for input(s): "TSH", "T4TOTAL", "FREET4", "T3FREE", "THYROIDAB" in the last 72 hours. Anemia Panel: No results for input(s): "VITAMINB12", "FOLATE", "FERRITIN", "TIBC", "IRON", "RETICCTPCT" in the last 72 hours. Urine analysis:    Component Value Date/Time   COLORURINE YELLOW 02/12/2022 2353   APPEARANCEUR TURBID (A) 02/12/2022 2353   LABSPEC 1.012 02/12/2022 2353   PHURINE 6.0 02/12/2022 [place] 02/12/2022 2353  HGBUR LARGE (A) 02/12/2022 2353   BILIRUBINUR NEGATIVE 02/12/2022 2353   KETONESUR NEGATIVE 02/12/2022 2353   PROTEINUR 100 (A) 02/12/2022 2353   UROBILINOGEN 0.2 03/12/2008 0315   NITRITE NEGATIVE 02/12/2022 2353   LEUKOCYTESUR MODERATE (A) 02/12/2022 2353   Sepsis Labs: '@LABRCNTIP'$ (procalcitonin:4,lacticidven:4) ) Recent Results (from the past 240 hour(s))  Resp Panel by RT-PCR (Flu A&B, Covid) Anterior Nasal Swab     Status: None   Collection Time: 02/12/22 10:54 PM   Specimen: Anterior Nasal Swab  Result Value Ref Range Status   SARS Coronavirus 2 by RT PCR NEGATIVE NEGATIVE Final    Comment: (NOTE) SARS-CoV-2 target nucleic acids are NOT DETECTED.  The SARS-CoV-2 RNA is generally detectable in upper respiratory specimens during the acute phase of infection. The lowest concentration of SARS-CoV-2 viral copies this assay can detect is 138 copies/mL. A negative result does not preclude SARS-Cov-2 infection and should not be used as the sole basis for treatment or other patient  management decisions. A negative result may occur with  improper specimen collection/handling, submission of specimen other than nasopharyngeal swab, presence of viral mutation(s) within the areas targeted by this assay, and inadequate number of viral copies(<138 copies/mL). A negative result must be combined with clinical observations, patient history, and epidemiological information. The expected result is Negative.  Fact Sheet for Patients:  EntrepreneurPulse.com.au  Fact Sheet for Healthcare Providers:  IncredibleEmployment.be  This test is no t yet approved or cleared by the [place] FDA and  has been authorized for detection and/or diagnosis of SARS-CoV-2 by FDA under an Emergency Use Authorization (EUA). This EUA will remain  in effect (meaning this test can be used) for the duration of the COVID-19 declaration under Section 564(b)(1) of the Act, 21 U.S.C.section 360bbb-3(b)(1), unless the authorization is terminated  or revoked sooner.       Influenza A by PCR NEGATIVE NEGATIVE Final   Influenza B by PCR NEGATIVE NEGATIVE Final    Comment: (NOTE) The Xpert Xpress SARS-CoV-2/FLU/RSV plus assay is intended as an aid in the diagnosis of influenza from Nasopharyngeal swab specimens and should not be used as a sole basis for treatment. Nasal washings and aspirates are unacceptable for Xpert Xpress SARS-CoV-2/FLU/RSV testing.  Fact Sheet for Patients: EntrepreneurPulse.com.au  Fact Sheet for Healthcare Providers: IncredibleEmployment.be  This test is not yet approved or cleared by the [place] FDA and has been authorized for detection and/or diagnosis of SARS-CoV-2 by FDA under an Emergency Use Authorization (EUA). This EUA will remain in effect (meaning this test can be used) for the duration of the COVID-19 declaration under Section 564(b)(1) of the Act, 21 U.S.C. section 360bbb-3(b)(1),  unless the authorization is terminated or revoked.  Performed at [facility], [place] [address] [place], [place] [address]      Radiological Exams on Admission: DG Chest Port 1 View  Result Date: 02/12/2022 CLINICAL DATA:  Hematuria and possible sepsis, initial encounter EXAM: PORTABLE CHEST 1 VIEW COMPARISON:  11/25/2010 FINDINGS: Cardiac shadow is within normal limits. Left basilar airspace opacity is noted with small effusion. No bony abnormality is noted. IMPRESSION: Left basilar airspace opacity with small effusion. Electronically Signed   By: Inez Catalina M.D.   On: 02/12/2022 22:32    EKG: Independently reviewed. Sinus tachycadrdia, rate 114, PVC, 1st degree AV block.   Assessment/Plan   1. Sepsis d/t pneumonia  - Presents with cough and family concern for pneumonia and is found to have fever, tachycardia, leukocytosis, and CXR concerning  for pneumonia  - Blood cultures collected in ED and antibiotics started  - Continue Rocephin and azithromycin, check strep pneumo and legionella antigens, trend procalcitonin, follow cultures and clinical response to treatment    2. Hematuria  - Pt's family noted gross hematuria for which he was scheduled to see urology 02/13/22  - Not obstructed and no bacteria seen on urine microscopy - Monitor for obstruction, otherwise follow-up with urology on discharge    3. Type II DM  - Check CBGs and use low-intensity SSI for now   4. Hypertension  - Continue losartan, hold diuretics for now    5. HLD  - Continue Lipitor   6. Dementia  - Delirium precautions, continue Namenda      DVT prophylaxis: SCD   Code Status: Full  Level of Care: Level of care: Telemetry Family Communication: Wife at bedside  Disposition Plan:  Patient is from: home  Anticipated d/c is to: TBD Anticipated d/c date is: 02/15/22  Patient currently: Pending improvement in infection, transition to oral antibiotic Consults called: none  Admission  status: Inpatient     Vianne Bulls, MD [facility]  02/13/2022, 12:39 AM

## 2022-02-13 NOTE — Hospital Course (Signed)
Francisco Leon is a 88 y.o. male with past medical history of hypertension, hyperlipidemia, type 2 diabetes, vascular dementia, hearing loss, BPH presented to hospital with cough and hematuria worsened by for 1 day started 4 days prior to presentation.  He was also having gross hematuria and was supposed to follow-up with urology as outpatient.  In the ED, patient was febrile and mildly hypoxic with tachycardia.  EKG showed sinus tachycardia.  Chest x-ray showed left basilar pneumonia.  Patient was noted to have hypokalemia and leukocytosis.  Lactate was normal.  COVID and influenza PCR negative.  Urine showed moderate 50 RBC/hpf but no bacteria seen.  Blood cultures were collected from the ED patient was given a Rocephin Zithromax and was admitted hospital for further evaluation and treatment.  Assessment/Plan    Sepsis due to pneumonia. Patient presented with cough fever tachycardia leukocytosis and chest x-ray concerning for pneumonia.  Follow-up blood cultures.  Continue Rocephin and Zithromax.  Pending strep pneumo and legionella antigens procalcitonin, follow blood cultures and clinical response to treatment.  Lactate was 1.4.  COVID and influenza negative.  Albumin level was low at 2.5.  Respiratory panel negative for COVID and influenza.  Urine culture pending   Hematuria. Patient was was scheduled to see urology 02/13/22.  No bacteria on urine microscopy.  Type II DM  Continue sliding scale insulin Accu-Cheks.   Essential hypertension  Continue losartan.  Diuretics on hold.   Hyperlipidemia - Continue Lipitor    Dementia  - Delirium precautions, continue Namenda

## 2022-02-13 NOTE — ED Notes (Signed)
Pt in bed with eyes closed, resps even and unlabored, sig other at bedside, pt remains on monitor, appears to be in sinus rhythm, pt satting 945 on 2L via [place].

## 2022-02-14 ENCOUNTER — Inpatient Hospital Stay (HOSPITAL_COMMUNITY): Payer: Medicare Other

## 2022-02-14 DIAGNOSIS — Z515 Encounter for palliative care: Secondary | ICD-10-CM

## 2022-02-14 DIAGNOSIS — A419 Sepsis, unspecified organism: Secondary | ICD-10-CM | POA: Diagnosis not present

## 2022-02-14 DIAGNOSIS — Z7189 Other specified counseling: Secondary | ICD-10-CM

## 2022-02-14 DIAGNOSIS — F0150 Vascular dementia without behavioral disturbance: Secondary | ICD-10-CM | POA: Diagnosis not present

## 2022-02-14 DIAGNOSIS — F0390 Unspecified dementia without behavioral disturbance: Secondary | ICD-10-CM

## 2022-02-14 DIAGNOSIS — E119 Type 2 diabetes mellitus without complications: Secondary | ICD-10-CM | POA: Diagnosis not present

## 2022-02-14 DIAGNOSIS — J189 Pneumonia, unspecified organism: Secondary | ICD-10-CM | POA: Diagnosis not present

## 2022-02-14 LAB — URINE CULTURE: Culture: >=100000 — AB

## 2022-02-14 LAB — CBC
HCT: 32.6 % — ABNORMAL LOW (ref 39.0–52.0)
Hemoglobin: 10.8 g/dL — ABNORMAL LOW (ref 13.0–17.0)
MCH: 28.1 pg (ref 26.0–34.0)
MCHC: 33.1 g/dL (ref 30.0–36.0)
MCV: 84.9 fL (ref 80.0–100.0)
Platelets: 244 10*3/uL (ref 150–400)
RBC: 3.84 MIL/uL — ABNORMAL LOW (ref 4.22–5.81)
RDW: 13.4 % (ref 11.5–15.5)
WBC: 14 10*3/uL — ABNORMAL HIGH (ref 4.0–10.5)
nRBC: 0 % (ref 0.0–0.2)

## 2022-02-14 LAB — GLUCOSE, CAPILLARY
Glucose-Capillary: 145 mg/dL — ABNORMAL HIGH (ref 70–99)
Glucose-Capillary: 158 mg/dL — ABNORMAL HIGH (ref 70–99)
Glucose-Capillary: 141 mg/dL — ABNORMAL HIGH (ref 70–99)
Glucose-Capillary: 127 mg/dL — ABNORMAL HIGH (ref 70–99)

## 2022-02-14 LAB — BASIC METABOLIC PANEL
Anion gap: 6 (ref 5–15)
BUN: 31 mg/dL — ABNORMAL HIGH (ref 8–23)
CO2: 22 mmol/L (ref 22–32)
Calcium: 10.1 mg/dL (ref 8.9–10.3)
Chloride: 105 mmol/L (ref 98–111)
Creatinine, Ser: 2.18 mg/dL — ABNORMAL HIGH (ref 0.61–1.24)
GFR, Estimated: 28 mL/min — ABNORMAL LOW (ref >60)
Glucose, Bld: 169 mg/dL — ABNORMAL HIGH (ref 70–99)
Potassium: 3.8 mmol/L (ref 3.5–5.1)
Sodium: 133 mmol/L — ABNORMAL LOW (ref 135–145)

## 2022-02-14 LAB — MAGNESIUM: Magnesium: 2.5 mg/dL — ABNORMAL HIGH (ref 1.7–2.4)

## 2022-02-14 LAB — PROCALCITONIN: Procalcitonin: 0.71 ng/mL

## 2022-02-14 MED ORDER — FINASTERIDE 5 MG PO TABS
5.0000 mg | ORAL_TABLET | Freq: Every day | ORAL | Status: DC
  Administered 2022-02-17 – 2022-02-25 (×9): 5 mg via ORAL
  Filled 2022-02-14 (×9): qty 1

## 2022-02-14 MED ORDER — LORAZEPAM 2 MG/ML IJ SOLN
0.5000 mg | INTRAMUSCULAR | Status: AC | PRN
  Filled 2022-02-14: qty 1

## 2022-02-14 MED ORDER — LORAZEPAM 2 MG/ML IJ SOLN
0.5000 mg | Freq: Once | INTRAMUSCULAR | Status: AC
  Administered 2022-02-14: 0.5 mg via INTRAVENOUS
  Filled 2022-02-14: qty 1

## 2022-02-14 MED ORDER — HYDRALAZINE HCL 20 MG/ML IJ SOLN
10.0000 mg | Freq: Four times a day (QID) | INTRAMUSCULAR | Status: DC | PRN
  Administered 2022-02-20: 10 mg via INTRAVENOUS
  Filled 2022-02-14: qty 1

## 2022-02-14 NOTE — Consult Note (Signed)
Reason for Consult:Gross Hematuria, Urinary Retention / Acute Renal Failure  Referring Physician: Flora Lipps MD  Francisco Leon is an 88 y.o. male.  HPI:   1 - Gross Hematuria - long h/o on off hematuria on / off from presemed BPH since 2021. CT 2021 and cysto w/o neoplasia. UCX 10/25 staph (?contaminant), now on rocpehin.   2 - Urinary Retention / Acute Renal Failure - on tamsulosin at baseline BID with Dr. Lovena Neighbours. Baseline Cr <1.2 with rise to 2s during hospitalization for pneumonia and noted to be in retention. Small bore catheter placed. CT 10/27 with malpositioned catheter, balloon in massive 200gm prostate. Restarted on finasteride and new large bore 3 way catheter placed to traction and slow irrigation.   PMH sig for dementia (wife helps with some ADL's). His PCP is Donnie Coffin MD.  Today " Francisco Leon" is seen in consultation for above.     Past Medical History:  Diagnosis Date   Allergic rhinitis    Balance disorder    BPH (benign prostatic hyperplasia)    Diabetes mellitus without complication (HCC)    Elevated cholesterol    Generalized edema    Hearing loss    Hypertension    Pure hypercholesterolemia    PVC (premature ventricular contraction)     Past Surgical History:  Procedure Laterality Date   CHOLECYSTECTOMY  2009   TONSILLECTOMY      Family History  Problem Relation Age of Onset   CVA Mother    Heart attack Father    CVA Brother     Social History:  reports that he has never smoked. He has never used smokeless tobacco. He reports that he does not currently use alcohol. No history on file for drug use.  Allergies:  Allergies  Allergen Reactions   Dutasteride Other (See Comments)    Gynecomastia   Lisinopril Other (See Comments)    Dry cough   Sulfa Antibiotics Nausea And Vomiting    Medications: I have reviewed the patient's current medications.  Results for orders placed or performed during the hospital encounter of 02/12/22 (from the past  48 hour(s))  Lactic acid, plasma     Status: None   Collection Time: 02/12/22 10:10 PM  Result Value Ref Range   Lactic Acid, Venous 1.4 0.5 - 1.9 mmol/L    Comment: Performed at [facility], [place] [address], [place], [place] [address]  Comprehensive metabolic panel     Status: Abnormal   Collection Time: 02/12/22 10:10 PM  Result Value Ref Range   Sodium 132 (L) 135 - 145 mmol/L   Potassium 3.2 (L) 3.5 - 5.1 mmol/L   Chloride 99 98 - 111 mmol/L   CO2 24 22 - 32 mmol/L   Glucose, Bld 174 (H) 70 - 99 mg/dL    Comment: Glucose reference range applies only to samples taken after fasting for at least 8 hours.   BUN 19 8 - 23 mg/dL   Creatinine, Ser 1.10 0.61 - 1.24 mg/dL   Calcium 10.1 8.9 - 10.3 mg/dL   Total Protein 7.1 6.5 - 8.1 g/dL   Albumin 3.5 3.5 - 5.0 g/dL   AST 16 15 - 41 U/L   ALT 15 0 - 44 U/L   Alkaline Phosphatase 87 38 - 126 U/L   Total Bilirubin 1.5 (H) 0.3 - 1.2 mg/dL   GFR, Estimated >60 >60 mL/min    Comment: (NOTE) Calculated using the CKD-EPI Creatinine Equation (2021)    Anion gap  9 5 - 15    Comment: Performed at [facility], [place] [address] [place], [place] [address]  CBC with Differential     Status: Abnormal   Collection Time: 02/12/22 10:10 PM  Result Value Ref Range   WBC 19.2 (H) 4.0 - 10.5 K/uL   RBC 3.95 (L) 4.22 - 5.81 MIL/uL   Hemoglobin 11.1 (L) 13.0 - 17.0 g/dL   HCT 33.9 (L) 39.0 - 52.0 %   MCV 85.8 80.0 - 100.0 fL   MCH 28.1 26.0 - 34.0 pg   MCHC 32.7 30.0 - 36.0 g/dL   RDW 13.1 11.5 - 15.5 %   Platelets 263 150 - 400 K/uL   nRBC 0.0 0.0 - 0.2 %   Neutrophils Relative % 86 %   Neutro Abs 16.6 (H) 1.7 - 7.7 K/uL   Lymphocytes Relative 4 %   Lymphs Abs 0.7 0.7 - 4.0 K/uL   Monocytes Relative 9 %   Monocytes Absolute 1.8 (H) 0.1 - 1.0 K/uL   Eosinophils Relative 0 %   Eosinophils Absolute 0.0 0.0 - 0.5 K/uL   Basophils Relative 0 %   Basophils Absolute 0.0 0.0 - 0.1 K/uL   Immature Granulocytes 1  %   Abs Immature Granulocytes 0.15 (H) 0.00 - 0.07 K/uL    Comment: Performed at [facility], [place] [address], [place], [place] [address]  Protime-INR     Status: None   Collection Time: 02/12/22 10:10 PM  Result Value Ref Range   Prothrombin Time 13.9 11.4 - 15.2 seconds   INR 1.1 0.8 - 1.2    Comment: (NOTE) INR goal varies based on device and disease states. Performed at [facility], [place] [address] [place], [place] [address]   APTT     Status: Abnormal   Collection Time: 02/12/22 10:10 PM  Result Value Ref Range   aPTT 22 (L) 24 - 36 seconds    Comment: Performed at [facility], [place] [address] [place], [place] [address]  Blood Culture (routine x 2)     Status: None (Preliminary result)   Collection Time: 02/12/22 10:10 PM   Specimen: BLOOD  Result Value Ref Range   Specimen Description      BLOOD LEFT ANTECUBITAL Performed at [facility] [address] [place], [place] [address]    Special Requests      BOTTLES DRAWN AEROBIC AND ANAEROBIC Blood Culture results may not be optimal due to an excessive volume of blood received in culture bottles Performed at [facility] [address] [place], [place] [address]    Culture      NO GROWTH 1 DAY Performed at [facility] [address], [place], [place] [address]    Report Status PENDING   Hemoglobin A1c     Status: Abnormal   Collection Time: 02/12/22 10:10 PM  Result Value Ref Range   Hgb A1c MFr Bld 6.5 (H) 4.8 - 5.6 %    Comment: (NOTE) Pre diabetes:          5.7%-6.4%  Diabetes:              >6.4%  Glycemic control for   <7.0% adults with diabetes    Mean Plasma Glucose 139.85 mg/dL    Comment: Performed at [place] [address] [place], [place] [address]  Procalcitonin - Baseline     Status: None   Collection Time: 02/12/22  10:10 PM  Result Value Ref Range   Procalcitonin <0.10 ng/mL     Comment:        Interpretation: PCT (Procalcitonin) <= 0.5 ng/mL: Systemic infection (sepsis) is not likely. Local bacterial infection is possible. (NOTE)       Sepsis PCT Algorithm           Lower Respiratory Tract                                      Infection PCT Algorithm    ----------------------------     ----------------------------         PCT < 0.25 ng/mL                PCT < 0.10 ng/mL          Strongly encourage             Strongly discourage   discontinuation of antibiotics    initiation of antibiotics    ----------------------------     -----------------------------       PCT 0.25 - 0.50 ng/mL            PCT 0.10 - 0.25 ng/mL               OR       >80% decrease in PCT            Discourage initiation of                                            antibiotics      Encourage discontinuation           of antibiotics    ----------------------------     -----------------------------         PCT >= 0.50 ng/mL              PCT 0.26 - 0.50 ng/mL               AND        <80% decrease in PCT             Encourage initiation of                                             antibiotics       Encourage continuation           of antibiotics    ----------------------------     -----------------------------        PCT >= 0.50 ng/mL                  PCT > 0.50 ng/mL               AND         increase in PCT                  Strongly encourage                                      initiation of antibiotics    Strongly encourage escalation  of antibiotics                                     -----------------------------                                           PCT <= 0.25 ng/mL                                                 OR                                        > 80% decrease in PCT                                      Discontinue / Do not initiate                                             antibiotics  Performed at [facility] [address] [place], [place] [address]   Blood Culture (routine x 2)     Status: None (Preliminary result)   Collection Time: 02/12/22 10:20 PM   Specimen: BLOOD  Result Value Ref Range   Specimen Description      BLOOD RIGHT ANTECUBITAL Performed at [facility] [address] [place], [place] [address]    Special Requests      BOTTLES DRAWN AEROBIC AND ANAEROBIC Blood Culture results may not be optimal due to an excessive volume of blood received in culture bottles Performed at [facility] [address] [place], [place] [address]    Culture      NO GROWTH 1 DAY Performed at [facility] [address], [place], [place] [address]    Report Status PENDING   Resp Panel by RT-PCR (Flu A&B, Covid) Anterior Nasal Swab     Status: None   Collection Time: 02/12/22 10:54 PM   Specimen: Anterior Nasal Swab  Result Value Ref Range   SARS Coronavirus 2 by RT PCR NEGATIVE NEGATIVE    Comment: (NOTE) SARS-CoV-2 target nucleic acids are NOT DETECTED.  The SARS-CoV-2 RNA is generally detectable in upper respiratory specimens during the acute phase of infection. The lowest concentration of SARS-CoV-2 viral copies this assay can detect is 138 copies/mL. A negative result does not preclude SARS-Cov-2 infection and should not be used as the sole basis for treatment or other patient management decisions. A negative result may occur with  improper specimen collection/handling, submission of specimen other than nasopharyngeal swab, presence of viral mutation(s) within the areas targeted by this assay, and inadequate number of viral copies(<138 copies/mL). A negative result must be combined with clinical observations, patient history, and epidemiological information. The expected result is Negative.  Fact Sheet for Patients:  EntrepreneurPulse.com.au  Fact Sheet for Healthcare Providers:  IncredibleEmployment.be  This  test  is no t yet approved or cleared by the [place] and  has been authorized for detection and/or diagnosis of SARS-CoV-2 by FDA under an Emergency Use Authorization (EUA). This EUA will remain  in effect (meaning this test can be used) for the duration of the COVID-19 declaration under Section 564(b)(1) of the Act, 21 U.S.C.section 360bbb-3(b)(1), unless the authorization is terminated  or revoked sooner.       Influenza A by PCR NEGATIVE NEGATIVE   Influenza B by PCR NEGATIVE NEGATIVE    Comment: (NOTE) The Xpert Xpress SARS-CoV-2/FLU/RSV plus assay is intended as an aid in the diagnosis of influenza from Nasopharyngeal swab specimens and should not be used as a sole basis for treatment. Nasal washings and aspirates are unacceptable for Xpert Xpress SARS-CoV-2/FLU/RSV testing.  Fact Sheet for Patients: EntrepreneurPulse.com.au  Fact Sheet for Healthcare Providers: IncredibleEmployment.be  This test is not yet approved or cleared by the [place] FDA and has been authorized for detection and/or diagnosis of SARS-CoV-2 by FDA under an Emergency Use Authorization (EUA). This EUA will remain in effect (meaning this test can be used) for the duration of the COVID-19 declaration under Section 564(b)(1) of the Act, 21 U.S.C. section 360bbb-3(b)(1), unless the authorization is terminated or revoked.  Performed at [facility], [place] [address], [place], [place] [address]   Urinalysis, Routine w reflex microscopic Urine, Clean Catch     Status: Abnormal   Collection Time: 02/12/22 11:53 PM  Result Value Ref Range   Color, Urine YELLOW YELLOW   APPearance TURBID (A) CLEAR   Specific Gravity, Urine 1.012 1.005 - 1.030   pH 6.0 5.0 - 8.0   Glucose, UA NEGATIVE NEGATIVE mg/dL   Hgb urine dipstick LARGE (A) NEGATIVE   Bilirubin Urine NEGATIVE NEGATIVE   Ketones, ur NEGATIVE NEGATIVE mg/dL   Protein, ur 100 (A)  NEGATIVE mg/dL   Nitrite NEGATIVE NEGATIVE   Leukocytes,Ua MODERATE (A) NEGATIVE   RBC / HPF >50 (H) 0 - 5 RBC/hpf   WBC, UA >50 (H) 0 - 5 WBC/hpf   Bacteria, UA NONE SEEN NONE SEEN   WBC Clumps PRESENT     Comment: Performed at [facility], [place] [address] [place], [place] [address]  Urine Culture     Status: Abnormal   Collection Time: 02/12/22 11:53 PM   Specimen: Urine, Clean Catch  Result Value Ref Range   Specimen Description      URINE, CLEAN CATCH Performed at [facility], [place] [address] [place], [place] [address]    Special Requests      NONE Performed at [facility], [place] [address] [place], [place] [address]    Culture (A)     >=100,000 COLONIES/mL GROUP B STREP(S.AGALACTIAE)ISOLATED TESTING AGAINST S. AGALACTIAE NOT ROUTINELY PERFORMED DUE TO PREDICTABILITY OF AMP/PEN/VAN SUSCEPTIBILITY. Performed at [facility], [place] [address] [place], [place] [address]    Report Status 02/14/2022 FINAL   POC CBG, ED     Status: Abnormal   Collection Time: 02/13/22  1:51 AM  Result Value Ref Range   Glucose-Capillary 134 (H) 70 - 99 mg/dL    Comment: Glucose reference range applies only to samples taken after fasting for at least 8 hours.  Basic metabolic panel     Status: Abnormal   Collection Time: 02/13/22  4:07 AM  Result Value Ref Range   Sodium 134 (L) 135 - 145 mmol/L   Potassium 2.8 (L) 3.5 -  5.1 mmol/L   Chloride 108 98 - 111 mmol/L   CO2 21 (L) 22 - 32 mmol/L   Glucose, Bld 142 (H) 70 - 99 mg/dL    Comment: Glucose reference range applies only to samples taken after fasting for at least 8 hours.   BUN 17 8 - 23 mg/dL   Creatinine, Ser 1.00 0.61 - 1.24 mg/dL   Calcium 8.3 (L) 8.9 - 10.3 mg/dL   GFR, Estimated >60 >60 mL/min    Comment: (NOTE) Calculated using the CKD-EPI Creatinine Equation (2021)    Anion gap 5 5 - 15    Comment: Performed at [facility], [place] [address], [place], [place] [address]  Hepatic function panel     Status: Abnormal   Collection Time: 02/13/22  4:07 AM  Result Value Ref Range   Total Protein 5.2 (L) 6.5 - 8.1 g/dL   Albumin 2.5 (L) 3.5 - 5.0 g/dL   AST 14 (L) 15 - 41 U/L   ALT 13 0 - 44 U/L   Alkaline Phosphatase 63 38 - 126 U/L   Total Bilirubin 1.1 0.3 - 1.2 mg/dL   Bilirubin, Direct 0.2 0.0 - 0.2 mg/dL   Indirect Bilirubin 0.9 0.3 - 0.9 mg/dL    Comment: Performed at [facility], [place] [address] [place], [place] [address]  Magnesium     Status: Abnormal   Collection Time: 02/13/22  4:07 AM  Result Value Ref Range   Magnesium 1.4 (L) 1.7 - 2.4 mg/dL    Comment: Performed at [facility], [place] [address], [place], [place] [address]  CBC     Status: Abnormal   Collection Time: 02/13/22  4:07 AM  Result Value Ref Range   WBC 11.8 (H) 4.0 - 10.5 K/uL   RBC 3.43 (L) 4.22 - 5.81 MIL/uL   Hemoglobin 9.7 (L) 13.0 - 17.0 g/dL   HCT 29.4 (L) 39.0 - 52.0 %   MCV 85.7 80.0 - 100.0 fL   MCH 28.3 26.0 - 34.0 pg   MCHC 33.0 30.0 - 36.0 g/dL   RDW 13.2 11.5 - 15.5 %   Platelets 235 150 - 400 K/uL   nRBC 0.0 0.0 - 0.2 %    Comment: Performed at [facility], [place] [address], [place], [place] [address]  CBG monitoring, ED     Status: Abnormal   Collection Time: 02/13/22  7:45 AM  Result Value Ref Range   Glucose-Capillary 147 (H) 70 - 99 mg/dL    Comment: Glucose reference range applies only to samples taken after fasting for at least 8 hours.  CBG monitoring, ED     Status: Abnormal   Collection Time: 02/13/22 12:26 PM  Result Value Ref Range   Glucose-Capillary 233 (H) 70 - 99 mg/dL    Comment: Glucose reference range applies only to samples taken after fasting for at least 8 hours.  Glucose, capillary     Status: Abnormal   Collection Time: 02/13/22  4:05 PM  Result Value Ref Range   Glucose-Capillary 177 (H) 70 - 99 mg/dL    Comment: Glucose reference range applies  only to samples taken after fasting for at least 8 hours.  Glucose, capillary     Status: Abnormal   Collection Time: 02/13/22  7:51 PM  Result Value Ref Range   Glucose-Capillary 174 (H) 70 - 99 mg/dL    Comment: Glucose reference range applies only to samples taken after fasting for at  least 8 hours.  Basic metabolic panel     Status: Abnormal   Collection Time: 02/14/22  5:32 AM  Result Value Ref Range   Sodium 133 (L) 135 - 145 mmol/L   Potassium 3.8 3.5 - 5.1 mmol/L   Chloride 105 98 - 111 mmol/L   CO2 22 22 - 32 mmol/L   Glucose, Bld 169 (H) 70 - 99 mg/dL    Comment: Glucose reference range applies only to samples taken after fasting for at least 8 hours.   BUN 31 (H) 8 - 23 mg/dL   Creatinine, Ser 2.18 (H) 0.61 - 1.24 mg/dL    Comment: DELTA CHECK NOTED   Calcium 10.1 8.9 - 10.3 mg/dL   GFR, Estimated 28 (L) >60 mL/min    Comment: (NOTE) Calculated using the CKD-EPI Creatinine Equation (2021)    Anion gap 6 5 - 15    Comment: Performed at [facility], [place] [address], [place], [place] [address]  CBC     Status: Abnormal   Collection Time: 02/14/22  5:32 AM  Result Value Ref Range   WBC 14.0 (H) 4.0 - 10.5 K/uL   RBC 3.84 (L) 4.22 - 5.81 MIL/uL   Hemoglobin 10.8 (L) 13.0 - 17.0 g/dL   HCT 32.6 (L) 39.0 - 52.0 %   MCV 84.9 80.0 - 100.0 fL   MCH 28.1 26.0 - 34.0 pg   MCHC 33.1 30.0 - 36.0 g/dL   RDW 13.4 11.5 - 15.5 %   Platelets 244 150 - 400 K/uL   nRBC 0.0 0.0 - 0.2 %    Comment: Performed at [facility], [place] [address] [place], [place] [address]  Procalcitonin     Status: None   Collection Time: 02/14/22  5:32 AM  Result Value Ref Range   Procalcitonin 0.71 ng/mL    Comment:        Interpretation: PCT > 0.5 ng/mL and <= 2 ng/mL: Systemic infection (sepsis) is possible, but other conditions are known to elevate PCT as well. (NOTE)       Sepsis PCT Algorithm           Lower Respiratory Tract                                       Infection PCT Algorithm    ----------------------------     ----------------------------         PCT < 0.25 ng/mL                PCT < 0.10 ng/mL          Strongly encourage             Strongly discourage   discontinuation of antibiotics    initiation of antibiotics    ----------------------------     -----------------------------       PCT 0.25 - 0.50 ng/mL            PCT 0.10 - 0.25 ng/mL               OR       >80% decrease in PCT            Discourage initiation of  antibiotics      Encourage discontinuation           of antibiotics    ----------------------------     -----------------------------         PCT >= 0.50 ng/mL              PCT 0.26 - 0.50 ng/mL                AND       <80% decrease in PCT             Encourage initiation of                                             antibiotics       Encourage continuation           of antibiotics    ----------------------------     -----------------------------        PCT >= 0.50 ng/mL                  PCT > 0.50 ng/mL               AND         increase in PCT                  Strongly encourage                                      initiation of antibiotics    Strongly encourage escalation           of antibiotics                                     -----------------------------                                           PCT <= 0.25 ng/mL                                                 OR                                        > 80% decrease in PCT                                      Discontinue / Do not initiate                                             antibiotics  Performed at [facility] [address], [place], [place] [address]   Magnesium     Status: Abnormal   Collection Time: 02/14/22  5:32 AM  Result Value Ref Range   Magnesium 2.5 (H) 1.7 - 2.4 mg/dL    Comment: Performed at [facility], [place] [address],  [place], [place] [address]  Glucose, capillary     Status: Abnormal   Collection Time: 02/14/22  7:24 AM  Result Value Ref Range   Glucose-Capillary 145 (H) 70 - 99 mg/dL    Comment: Glucose reference range applies only to samples taken after fasting for at least 8 hours.    DG Chest Port 1 View  Result Date: 02/12/2022 CLINICAL DATA:  Hematuria and possible sepsis, initial encounter EXAM: PORTABLE CHEST 1 VIEW COMPARISON:  11/25/2010 FINDINGS: Cardiac shadow is within normal limits. Left basilar airspace opacity is noted with small effusion. No bony abnormality is noted. IMPRESSION: Left basilar airspace opacity with small effusion. Electronically Signed   By: Inez Catalina M.D.   On: 02/12/2022 22:32    Review of Systems  Unable to perform ROS: Dementia  Constitutional:  Negative for chills and fever.  All other systems reviewed and are negative.  Blood pressure (!) 149/70, pulse 65, temperature 98.2 F (36.8 C), temperature source Oral, resp. rate 18, height '5\' 6"'$  (1.676 m), weight 83 kg, SpO2 95 %. Physical Exam Vitals reviewed.  Constitutional:      Comments: AOx0 in hospital bed. Non-combative. In mittens. Wife at bedise who provides all history and is extremely helpful with him.   HENT:     Head: Normocephalic.  Eyes:     Pupils: Pupils are equal, round, and reactive to light.  Cardiovascular:     Rate and Rhythm: Normal rate.  Pulmonary:     Effort: Pulmonary effort is normal.  Abdominal:     General: Abdomen is flat.  Genitourinary:    Comments: In situ small bore catheter with thick bloody urine.  Musculoskeletal:        General: Normal range of motion.     Comments: In soft mittents.   Neurological:     General: No focal deficit present.     BEDSIDE CATHETER REPLACEMENT / IRRIGATION:  IN situ caheter rmeoved and 50F coude hematuria catheter palced with 26m water in ballloon. Clots irrigated with 1L NS in 60cc aliquots to clot free / very light pink and conneted to  NS irrigation at 1gtt per second. NSG shows catheter strap traction, irrigation rate, and insturctions to continue above and voiced understanding. Appreciate NSG help.   Assessment/Plan:  Pt with recurrent hematuria / retention from massive prostate, worsened by catheter trauma. New catheter placed in correct position on slow irrigation and on traction to tamponade bleeding surface. We will manage that. REstarted finasteride as this is best medical care for BPH  and for recurrent prostate source bleeding.  We will hopefully titrate back irrigation, likely DC with catheter with plan for office trial of void. Consider short term androgen blockage ([place] if consvative measures fail.    TAlexis Frock10/27/2023, 11:30 AM

## 2022-02-14 NOTE — Consult Note (Signed)
Consultation Note Date: 02/14/2022   Patient Name: Francisco Leon  DOB: 05-Feb-1932  MRN: [id number]  Age / Sex: 88 y.o., male  PCP: Collene Leyden, MD Referring Physician: Flora Lipps, MD  Reason for Consultation: Establishing goals of care  HPI/Patient Profile: 88 y.o. male  with past medical history of hypertension, hyperlipidemia, type 2 diabetes, vascular dementia, hearing loss, and BPH admitted on 02/12/2022 with cough and hematuria. Patient diagnosed with sepsis d/t pna. Patient also with acute urinary retention - foley placed. Patient with new AKI. PMT consulted to discuss [place].  Clinical Assessment and Goals of Care: I have reviewed medical records including EPIC notes, labs and imaging, received report from RN, assessed the patient and then met with patient's wife and son  to discuss diagnosis prognosis, GOC, EOL wishes, disposition and options.  I introduced Palliative Medicine as specialized medical care for people living with serious illness. It focuses on providing relief from the symptoms and stress of a serious illness. The goal is to improve quality of life for both the patient and the family.  We discussed a brief life review of the patient. Patient and spouse have been married 67 years. 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 Wife shares about their [place].  As far as functional and nutritional status spouse tells me of a decline specifically over the past 7 weeks. He has become slower and lost function. He has required walker for mobility. She tells me about ongoing confusion and delusions.    We discussed patient's current illness and what it means in the larger context of patient's on-going co-morbidities.  Natural disease trajectory and expectations at EOL were discussed. We review patient's progressive cognitive decline. Wife shares her awareness this is not reversible. We discussed current delirium and the hope that as  infection clears mental status clears.   I attempted to elicit values and goals of care important to the patient.  We discuss importance of quality of life.   The difference between aggressive medical intervention and comfort care was considered in light of the patient's goals of care.   We review code status and wife is uncertain. She tells me she needs some time to consider.   Discussed with wife the importance of continued conversation with family and the medical providers regarding overall plan of care and treatment options, ensuring decisions are within the context of the patient's values and GOCs.    Wife and son share their concerns about patient's ongoing agitation and management of it. They share about negative experience in the past with antipsychotic and they would prefer to avoid that class of drugs. They feel low dose ativan has been helpful in managing agitation. We discuss sedating affects of ativan and how could negatively affect patient but also safety risk when he is incredibly agitated. After discussion we made plan to have low dose ativan available over the next 24 hours PRN then reevaluate. Discussed should only be used short term.   We briefly discuss potential needs at discharge including concerns about patient's loss of function. Wife would not want rehab facility - would prefer outpatient rehab.   Questions and concerns were addressed. The family was encouraged to call with questions or concerns.    Primary Decision Maker NEXT OF KIN - wife Shelba    SUMMARY OF RECOMMENDATIONS   - initial goals of care discussion - at this point family most concerned about management of agitation especially through the night - added low dose PRN ativan  for the next 24 hours, discussed with Dr. Louanne [place] - code status addressed, wife requests time to consider - PMT will follow along  Code Status/Advance Care Planning: Full code  Discharge Planning: To Be Determined      Primary  Diagnoses: Present on Admission:  Sepsis due to pneumonia ([place])  Hypertension  Pure hypercholesterolemia  Dementia ([place])  Hypokalemia  Hematuria   I have reviewed the medical record, interviewed the patient and family, and examined the patient. The following aspects are pertinent.  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  Tobacco Use   Smoking status: Never   Smokeless tobacco: Never  Vaping Use   Vaping Use: Never used  Substance and Sexual Activity   Alcohol use: Not Currently   Drug use: Not on file   Sexual activity: Not on file  Other Topics Concern   Not on file  Social History Narrative   Not on file   Social Determinants of Health   Financial Resource Strain: Not on file  Food Insecurity: No Food Insecurity (02/13/2022)   Hunger Vital Sign    Worried About Running Out of Food in the Last Year: Never true    Ran Out of Food in the Last Year: Never true  Transportation Needs: No Transportation Needs (02/13/2022)   PRAPARE - Hydrologist (Medical): No    Lack of Transportation (Non-Medical): No  Physical Activity: Not on file  Stress: Not on file  Social Connections: Not on file   Family History  Problem Relation Age of Onset   CVA Mother    Heart attack Father    CVA Brother    Scheduled Meds:  atorvastatin  10 mg Oral QHS   azithromycin  500 mg Oral QHS   baclofen  5 mg Oral  TID   escitalopram  10 mg Oral Daily   finasteride  5 mg Oral Daily   insulin aspart  0-5 Units Subcutaneous QHS   insulin aspart  0-6 Units Subcutaneous TID WC   memantine  10 mg Oral BID   mouth rinse  15 mL Mouth Rinse 4 times per day   potassium chloride  40 mEq Oral Once   sodium chloride  1 drop Both Eyes QHS   sodium chloride flush  3 mL Intravenous Q12H   tamsulosin  0.8 mg Oral Daily   Continuous Infusions:  cefTRIAXone (ROCEPHIN)  IV 2 g (02/13/22 2229)   PRN Meds:.acetaminophen **OR** acetaminophen, hydrALAZINE, LORazepam, ondansetron **OR** ondansetron (ZOFRAN) IV, mouth rinse, senna-docusate Allergies  Allergen Reactions   Dutasteride Other (See Comments)    Gynecomastia   Lisinopril Other (See Comments)    Dry cough   Sulfa Antibiotics Nausea And Vomiting   Review of Systems  Unable to perform ROS: Mental status change    Physical Exam Constitutional:  General: He is not in acute distress.    Appearance: He is ill-appearing.     Comments: obtunded  Pulmonary:     Effort: Pulmonary effort is normal.  Skin:    General: Skin is warm and dry.     Vital Signs: BP (!) 133/55 (BP Location: Left Arm)   Pulse (!) 56   Temp (!) 97.5 F (36.4 C) (Oral)   Resp (!) 22   Ht _0  (1.676 m)   Wt 83 kg   SpO2 95%   BMI 29.53 kg/m  Pain Scale: Faces   Pain Score: 3    SpO2: SpO2: 95 % O2 Device:SpO2: 95 % O2 Flow Rate: .O2 Flow Rate (L/min): 2 L/min  IO: Intake/output summary: No intake or output data in the 24 hours ending 02/14/22 1412  LBM: Last BM Date : 02/13/22 Baseline Weight: Weight: 83 kg Most recent weight: Weight: 83 kg     Palliative Assessment/Data: PPS 20% at this point r/t mental status     *Please note that this is a verbal dictation therefore any spelling or grammatical errors are due to the "[place] One" system interpretation.   Juel Burrow, DNP, AGNP-C Palliative Medicine Team [phone] Pager:  [phone]

## 2022-02-14 NOTE — Progress Notes (Addendum)
PROGRESS NOTE    Francisco Leon  DZH:[id number] DOB: Dec 26, 1931 DOA: 02/12/2022 PCP: Collene Leyden, MD    Brief Narrative:  Francisco Leon is a 88 y.o. male with past medical history of hypertension, hyperlipidemia, type 2 diabetes, vascular dementia, hearing loss, BPH presented to hospital with cough and hematuria worsened by for 1 day started 4 days prior to presentation.  He was also having gross hematuria and was supposed to follow-up with urology as outpatient.  In the ED, patient was febrile and mildly hypoxic with tachycardia.  EKG showed sinus tachycardia.  Chest x-ray showed left basilar pneumonia.  Patient was noted to have hypokalemia and leukocytosis.  Lactate was normal.  COVID and influenza PCR negative.  Urine showed moderate 50 RBC/hpf but no bacteria seen.  Blood cultures were collected from the ED patient was given a Rocephin Zithromax and was admitted hospital for further evaluation and treatment.  Assessment/Plan   Principal Problem:   Sepsis due to pneumonia [facility]) Active Problems:   Diabetes mellitus without complication ([place])   Hypertension   Pure hypercholesterolemia   Dementia ([place])   Hypokalemia   Hematuria   Sepsis due to pneumonia. Patient presented with cough fever, tachycardia leukocytosis and chest x-ray concerning for pneumonia.  Blood cultures pending.  Continue Rocephin and Zithromax.  Pending strep pneumo and legionella antigens,blood cultures.  Procalcitonin at 0.7.  Lactate was 1.4.  COVID and influenza negative.  Albumin level was low at 2.5.  Respiratory panel negative for COVID and influenza.  Urine culture pending   Hematuria acute urinary retention.. Patient was was scheduled to see urology 02/13/22.  No bacteruria on urine microscopy.  Has episodes of intermittent hematuria due to prostatic enlargement.  [facility] as [facility].  We will consider Foley catheter placement at this time.  Acute kidney injury.  Creatinine  today at 2.1 from 1.0 yesterday.  Continue to monitor closely.  Intake and output charting, bladder scan showed significant urinary retention.  We will put in a Foley catheter might need to go to catheter.  Has history of enlarged prostate and follows up with urology as outpatient.  Hold losartan.   Type II DM  Continue sliding scale insulin, Accu-Cheks.  We will continue to monitor blood glucose levels.   Essential hypertension  Hold losartan due to AKI.  Diuretics on hold.  Add as needed hydralazine.   Hyperlipidemia - Continue Lipitor   Hypomagnesemia.  Improved after replacement.   Confusion disorientation and agitation on the background of dementia  - Delirium precautions, continue [place] .  Palliative care has been consulted   DVT prophylaxis: SCDs Start: 02/13/22 0047   Code Status:     Code Status: Full Code  Disposition: Home likely in 1 to 2 days, evaluation.  Palliative care consult  Status is: Inpatient  Remains inpatient appropriate because: Sepsis secondary pneumonia, IV antibiotic, AKI, altered mental status no need for Foley catheter   Family Communication:   I again spoke with the patient's wife at bedside.  Patient lives with his wife at home.  Patient's wife states that he has been having declining mental condition recently.  Consultants:  Palliative care  Procedures:  None  Antimicrobials:  Rocephin and Zithromax IV.  Anti-infectives (From admission, onward)    Start     Dose/Rate Route Frequency Ordered Stop   02/13/22 2200  cefTRIAXone (ROCEPHIN) 2 g in sodium chloride 0.9 % 100 mL IVPB        2 g  200 mL/hr over 30 Minutes Intravenous Every 24 hours 02/13/22 0039 02/17/22 2159   02/13/22 2200  azithromycin (ZITHROMAX) tablet 500 mg        500 mg Oral Daily at bedtime 02/13/22 0039 02/17/22 2159   02/12/22 2215  ceFEPIme (MAXIPIME) 2 g in sodium chloride 0.9 % 100 mL IVPB  Status:  Discontinued        2 g 200 mL/hr over 30 Minutes Intravenous   Once 02/12/22 2201 02/12/22 2201   02/12/22 2215  cefTRIAXone (ROCEPHIN) 2 g in sodium chloride 0.9 % 100 mL IVPB  Status:  Discontinued        2 g 200 mL/hr over 30 Minutes Intravenous Every 24 hours 02/12/22 2202 02/13/22 0040   02/12/22 2215  azithromycin (ZITHROMAX) 500 mg in sodium chloride 0.9 % 250 mL IVPB        500 mg 250 mL/hr over 60 Minutes Intravenous  Once 02/12/22 2204 02/13/22 0053      Subjective: Today, patient was seen and examined at bedside.  Patient was very agitated and required Ativan yesterday.  Feels a somnolent this morning.  Renal function has worsened so bladder scan was done which showed significant urinary retention.  Had some hematuria which is intermittent as per the wife.   Objective: Vitals:   02/13/22 1604 02/13/22 1942 02/14/22 0006 02/14/22 0356  BP: (!) 143/68 (!) 149/99 (!) 155/76 (!) 149/70  Pulse: 68 85 77 65  Resp: '18 18 18   '$ Temp: 98.4 F (36.9 C) 98.2 F (36.8 C) 98.1 F (36.7 C) 98.2 F (36.8 C)  TempSrc: Oral Oral Oral Oral  SpO2: 95% 96% 91% 95%  Weight:      Height:        Intake/Output Summary (Last 24 hours) at 02/14/2022 0804 Last data filed at 02/13/2022 1150 Gross per 24 hour  Intake 188.7 ml  Output --  Net 188.7 ml    Filed Weights   02/12/22 2138  Weight: 83 kg    Physical Examination: Body mass index is 29.53 kg/m.   General:  Average built, not in obvious distress, has underlying dementia, HENT:   No scleral pallor or icterus noted. Oral mucosa is moist.  Chest:  Clear breath sounds.  Diminished breath sounds bilaterally. No crackles or wheezes.  CVS: S1 &S2 heard. No murmur.  Regular rate and rhythm. Abdomen: Soft, nontender, nondistended.  Bowel sounds are heard.   Extremities: No cyanosis, clubbing or edema.  Peripheral pulses are palpable. Psych: Somnolent, has underlying dementia, CNS: Somnolent at the time of my evaluation.  Skin: Warm and dry.  No rashes noted.  Data Reviewed:   CBC: Recent  Labs  Lab 02/12/22 2210 02/13/22 0407 02/14/22 0532  WBC 19.2* 11.8* 14.0*  NEUTROABS 16.6*  --   --   HGB 11.1* 9.7* 10.8*  HCT 33.9* 29.4* 32.6*  MCV 85.8 85.7 84.9  PLT 263 235 244     Basic Metabolic Panel: Recent Labs  Lab 02/12/22 2210 02/13/22 0407 02/14/22 0532  NA 132* 134* 133*  K 3.2* 2.8* 3.8  CL 99 108 105  CO2 24 21* 22  GLUCOSE 174* 142* 169*  BUN 19 17 31*  CREATININE 1.10 1.00 2.18*  CALCIUM 10.1 8.3* 10.1  MG  --  1.4* 2.5*     Liver Function Tests: Recent Labs  Lab 02/12/22 2210 02/13/22 0407  AST 16 14*  ALT 15 13  ALKPHOS 87 63  BILITOT 1.5* 1.1  PROT 7.1 5.2*  ALBUMIN 3.5 2.5*      Radiology Studies: DG Chest Port 1 View  Result Date: 02/12/2022 CLINICAL DATA:  Hematuria and possible sepsis, initial encounter EXAM: PORTABLE CHEST 1 VIEW COMPARISON:  11/25/2010 FINDINGS: Cardiac shadow is within normal limits. Left basilar airspace opacity is noted with small effusion. No bony abnormality is noted. IMPRESSION: Left basilar airspace opacity with small effusion. Electronically Signed   By: Inez Catalina M.D.   On: 02/12/2022 22:32      LOS: 2 days    Flora Lipps, MD [facility] Available via Epic secure chat 7am-7pm After these hours, please refer to coverage provider listed on [email or web address] 02/14/2022, 8:04 AM

## 2022-02-14 NOTE — Progress Notes (Signed)
PT Cancellation Note  Patient Details Name: Francisco Leon MRN: [id number] DOB: 04-17-1932   Cancelled Treatment:    Reason Eval/Treat Not Completed: Patient not medically ready (Per RN pt is agitated and not following commands. will follow up at later date/time as shcedule allows and pt able.)   [place], [place] Office [phone]  02/14/22 12:48 PM

## 2022-02-15 DIAGNOSIS — A419 Sepsis, unspecified organism: Secondary | ICD-10-CM | POA: Diagnosis not present

## 2022-02-15 DIAGNOSIS — J189 Pneumonia, unspecified organism: Secondary | ICD-10-CM | POA: Diagnosis not present

## 2022-02-15 DIAGNOSIS — F03911 Unspecified dementia, unspecified severity, with agitation: Secondary | ICD-10-CM

## 2022-02-15 DIAGNOSIS — R410 Disorientation, unspecified: Secondary | ICD-10-CM

## 2022-02-15 LAB — BASIC METABOLIC PANEL
Anion gap: 7 (ref 5–15)
BUN: 37 mg/dL — ABNORMAL HIGH (ref 8–23)
CO2: 24 mmol/L (ref 22–32)
Calcium: 10.4 mg/dL — ABNORMAL HIGH (ref 8.9–10.3)
Chloride: 109 mmol/L (ref 98–111)
Creatinine, Ser: 1.84 mg/dL — ABNORMAL HIGH (ref 0.61–1.24)
GFR, Estimated: 35 mL/min — ABNORMAL LOW (ref >60)
Glucose, Bld: 140 mg/dL — ABNORMAL HIGH (ref 70–99)
Potassium: 4 mmol/L (ref 3.5–5.1)
Sodium: 140 mmol/L (ref 135–145)

## 2022-02-15 LAB — GLUCOSE, CAPILLARY
Glucose-Capillary: 246 mg/dL — ABNORMAL HIGH (ref 70–99)
Glucose-Capillary: 154 mg/dL — ABNORMAL HIGH (ref 70–99)
Glucose-Capillary: 166 mg/dL — ABNORMAL HIGH (ref 70–99)
Glucose-Capillary: 172 mg/dL — ABNORMAL HIGH (ref 70–99)

## 2022-02-15 LAB — CBC
HCT: 34.1 % — ABNORMAL LOW (ref 39.0–52.0)
Hemoglobin: 11.2 g/dL — ABNORMAL LOW (ref 13.0–17.0)
MCH: 28.2 pg (ref 26.0–34.0)
MCHC: 32.8 g/dL (ref 30.0–36.0)
MCV: 85.9 fL (ref 80.0–100.0)
Platelets: 265 10*3/uL (ref 150–400)
RBC: 3.97 MIL/uL — ABNORMAL LOW (ref 4.22–5.81)
RDW: 13.4 % (ref 11.5–15.5)
WBC: 15.3 10*3/uL — ABNORMAL HIGH (ref 4.0–10.5)
nRBC: 0 % (ref 0.0–0.2)

## 2022-02-15 LAB — MAGNESIUM: Magnesium: 2.5 mg/dL — ABNORMAL HIGH (ref 1.7–2.4)

## 2022-02-15 MED ORDER — FINASTERIDE 5 MG PO TABS: 5.0000 mg | ORAL_TABLET | Freq: Every day | ORAL | 3 refills | Status: AC

## 2022-02-15 NOTE — Progress Notes (Signed)
Daily Progress Note   Patient Name: Francisco Leon       Date: 02/15/2022 DOB: 1931-09-23  Age: 88 y.o. MRN#: [id number] Attending Physician: Cristela Felt, MD Primary Care Physician: Collene Leyden, MD Admit Date: 02/12/2022  Reason for Consultation/Follow-up: Establishing goals of care  Subjective: Patient sleeping soundly. Wife at bedside. Wife reports he has woken up a few times and when he does he seems much less delirious than he has been throughout hospitalization.   Length of Stay: 3  Current Medications: Scheduled Meds:   atorvastatin  10 mg Oral QHS   azithromycin  500 mg Oral QHS   baclofen  5 mg Oral TID   escitalopram  10 mg Oral Daily   finasteride  5 mg Oral Daily   insulin aspart  0-5 Units Subcutaneous QHS   insulin aspart  0-6 Units Subcutaneous TID WC   memantine  10 mg Oral BID   mouth rinse  15 mL Mouth Rinse 4 times per day   sodium chloride  1 drop Both Eyes QHS   sodium chloride flush  3 mL Intravenous Q12H   tamsulosin  0.8 mg Oral Daily    Continuous Infusions:  cefTRIAXone (ROCEPHIN)  IV 2 g (02/14/22 2210)    PRN Meds: acetaminophen **OR** acetaminophen, hydrALAZINE, LORazepam, ondansetron **OR** ondansetron (ZOFRAN) IV, mouth rinse, senna-docusate  Physical Exam Constitutional:      General: He is not in acute distress.    Appearance: He is ill-appearing.     Comments: Sleeping, does not wake to voice or gentle touch  Pulmonary:     Effort: Pulmonary effort is normal.  Skin:    General: Skin is warm and dry.             Vital Signs: BP (!) 121/53 (BP Location: Left Arm)   Pulse (!) 55   Temp 98 F (36.7 C) (Oral)   Resp 14   Ht '5\' 6"'$  (1.676 m)   Wt 83 kg   SpO2 98%   BMI 29.53 kg/m  SpO2: SpO2: 98 % O2 Device: O2 Device: Nasal Cannula O2  Flow Rate: O2 Flow Rate (L/min): 2 L/min  Intake/output summary:  Intake/Output Summary (Last 24 hours) at 02/15/2022 1212 Last data filed at 02/15/2022 0500 Gross per 24 hour  Intake 200 ml  Output 3800 ml  Net -3600 ml   LBM: Last BM Date : 02/13/22 Baseline Weight: Weight: 83 kg Most recent weight: Weight: 83 kg       Palliative Assessment/Data:PPS 20% at this point r/t mental status      Patient Active Problem List   Diagnosis Date Noted   Diabetes mellitus without complication ([place]) 76/73/4193   Hypertension 02/13/2022   Pure hypercholesterolemia 02/13/2022   Dementia ([place]) 02/13/2022   Hypokalemia 02/13/2022   Hematuria 02/13/2022   Sepsis due to pneumonia ([place]) 02/12/2022    Palliative Care Assessment & Plan   HPI: 88 y.o. male  with past medical history of hypertension, hyperlipidemia, type 2 diabetes, vascular dementia, hearing loss, and BPH admitted on 02/12/2022 with cough and hematuria. Patient diagnosed with sepsis d/t pna. Patient also with acute urinary retention - foley placed. Patient with new AKI. PMT  consulted to discuss [place].   Assessment: Follow up today. Patient sleeping during my visit. Wife reports he has not required any further administration of ativan. He has woken up a few times with her and when he does she reports mental status seems much clearer.  We discuss delirium precautions.  Discussed plan to continue with current interventions and hope for ongoing improvement.  Wife not interested in SNF - hopeful for return home.   Recommendations/Plan: Continue with full code/full scope care - code status discussed 10/27, wife considering Ativan order expired, delirium management per delirium precautions Standard delirium management (adapted from NICE guidelines 2011 for prevention of delirium):  Provide continuity of care when possible (avoid frequent changing of surroundings and staff).  Frequent reorientation to time with:  A clock should  always be visible.  Make sure Calendar/white board is updated.  Lights on/blinds open during the day and off/closed at night.  Encourage frequent family visits.  Monitor for and treat dehydration/constipation.  Optimize oxygen saturation.  Avoid catheters and IV's when possible and look for/treat infections.  Encourage early mobility.  Assess and treat pain  Ensure adequate nutrition and functioning dentures.  Address reversible causes of hearing and visual impairment:  Use pocket talker if hearing aids are unavailable.  Avoid sleep disturbance (normalize sleep/wake cycle).  Minimize disturbances and consider NOT obtaining vitals at night if possible.  Review Medications to avoid polypharmacy and avoid deliriogenic medications when possible:  Benzodiazepines  Dihydropyridines.  Antihistamines.  Anticholinergics   (Possibly avoid: H2 blockers, tricyclic antidepressants, antiparkinson medications, steroids, NSAID's).  PMT will shadow chart, family has our contact info  Code Status: Full code  Care plan was discussed with wife  Thank you for allowing the Palliative Medicine Team to assist in the care of this patient.  *Please note that this is a verbal dictation therefore any spelling or grammatical errors are due to the "[place] One" system interpretation.  Juel Burrow, DNP, [facility] Palliative Medicine Team Team Phone # [phone]  Pager [phone]

## 2022-02-15 NOTE — Progress Notes (Signed)
Subjective/Chief Complaint:  1 - Gross Hematuria - long h/o on off hematuria on / off from presemed BPH since 2021. CT 2021 and cysto w/o neoplasia. UCX 10/25 staph (?contaminant), now on rocpehin.    2 - Urinary Retention / Acute Renal Failure - on tamsulosin at baseline BID with Dr. Lovena Neighbours. Baseline Cr <1.2 with rise to 2s during hospitalization for pneumonia and noted to be in retention. Small bore catheter placed. CT 10/27 with malpositioned catheter, balloon in massive 200gm prostate. Restarted on finasteride and new large bore 3 way catheter placed to traction and slow irrigation. Irrigation stoppedn 10/28.    PMH sig for dementia (wife helps with some ADL's). His PCP is Donnie Coffin MD.   Today " Francisco Leon" is stable. Hematuria nearly resolved. His CBI was not continued overnight despite explicit face to face NSG instructions. Fortunatley his clnical hematuria is improved.      Objective: Vital signs in last 24 hours: Temp:  [97.5 F (36.4 C)-98.4 F (36.9 C)] 98 F (36.7 C) (10/28 0412) Pulse Rate:  [55-65] 55 (10/28 0412) Resp:  [14-22] 14 (10/28 0412) BP: (105-133)/(51-55) 121/53 (10/28 0412) SpO2:  [95 %-98 %] 98 % (10/28 0412) Last BM Date : 02/13/22  Intake/Output from previous day: 10/27 0701 - 10/28 0700 In: 200 [IV Piggyback:200] Out: 3800 [Urine:3800] Intake/Output this shift: No intake/output data recorded.  Physical Exam Vitals reviewed.  Constitutional:      Comments: AOx0 in hospital bed. Non-combative. In mittens. Wife at bedside.  HENT:     Head: Normocephalic.  Eyes:     Pupils: Pupils are equal, round, and reactive to light.  Cardiovascular:     Rate and Rhythm: Normal rate.  Pulmonary:     Effort: Pulmonary effort is normal.  Abdominal:     General: Abdomen is flat.  Genitourinary:    Comments: In situ large 3 way catheter with empty CBI irrigation bag and catheter strap traction, medium yellow urine with small debris. Significantly improved.   Musculoskeletal:        General: Normal range of motion.     Comments: In soft mittents.   Neurological:     General: No focal deficit present.   Lab Results:  Recent Labs    02/14/22 0532 02/15/22 0536  WBC 14.0* 15.3*  HGB 10.8* 11.2*  HCT 32.6* 34.1*  PLT 244 265   BMET Recent Labs    02/14/22 0532 02/15/22 0536  NA 133* 140  K 3.8 4.0  CL 105 109  CO2 22 24  GLUCOSE 169* 140*  BUN 31* 37*  CREATININE 2.18* 1.84*  CALCIUM 10.1 10.4*   PT/INR Recent Labs    02/12/22 2210  LABPROT 13.9  INR 1.1   ABG No results for input(s): "PHART", "HCO3" in the last 72 hours.  Invalid input(s): "PCO2", "PO2"  Studies/Results: CT ABDOMEN PELVIS WO CONTRAST  Result Date: 02/14/2022 CLINICAL DATA:  Acute hematuria, acute renal failure. EXAM: CT ABDOMEN AND PELVIS WITHOUT CONTRAST TECHNIQUE: Multidetector CT imaging of the abdomen and pelvis was performed following the standard protocol without IV contrast. RADIATION DOSE REDUCTION: This exam was performed according to the departmental dose-optimization program which includes automated exposure control, adjustment of the mA and/or kV according to patient size and/or use of iterative reconstruction technique. COMPARISON:  October 21, 2019. FINDINGS: Lower chest: Small bilateral pleural effusions are noted with mild adjacent subsegmental atelectasis. Hepatobiliary: No focal liver abnormality is seen. Status post cholecystectomy. No biliary dilatation. Pancreas: Diffuse pancreatic atrophy  is noted. No acute inflammation is noted. 1.9 cm rounded density is noted in pancreatic tail with water density. Spleen: Normal in size without focal abnormality. Adrenals/Urinary Tract: Adrenal glands appear normal. Stable bilateral renal cysts are noted for which no further follow-up is required. Severe left hydroureteronephrosis is noted without obstructing calculus. Severely enlarged prostate gland is noted which extends into the urinary bladder and  potentially may be the cause of obstruction. Layering high density material is noted in the urinary bladder which may represent hemorrhage. Small nonobstructive right renal calculus is noted. Mild right hydroureteronephrosis is noted also without obstructing calculus. Moderate urinary bladder distention is noted suggesting bladder bladder outlet obstruction. Foley catheter is noted with balloon inflated within the enlarged prostate gland. Distal tip is also still within the prostatic urethra. Stomach/Bowel: Stomach is within normal limits. Appendix appears normal. No evidence of bowel wall thickening, distention, or inflammatory changes. Stool is noted throughout the colon including large amount of stool in rectum. Vascular/Lymphatic: Aortic atherosclerosis. No enlarged abdominal or pelvic lymph nodes. Reproductive: As noted above, severe prostate enlargement is noted with extension in the urinary bladder. Other: No abdominal wall hernia or abnormality. No abdominopelvic ascites. Musculoskeletal: No acute or significant osseous findings. IMPRESSION: Severe left hydroureteronephrosis is noted as well as mild right hydroureteronephrosis, without evidence of obstructing calculus. Also noted is moderate urinary bladder distention. This appears to be due to bladder outlet obstruction secondary to severely enlarged prostate gland and extends into the inferior and posterior portion of the urinary bladder. Foley catheter is noted, but balloon is inflated within the prostate gland in the distal tip of the catheter is also still within the prostatic urethra. Layering high density material is noted in the urinary bladder concerning for hemorrhage. 1.9 cm rounded water density abnormality is noted in pancreatic tail which is not significantly enlarged compared to prior exam. Continued follow-up is recommended. Small bilateral pleural effusions are noted with adjacent subsegmental atelectasis. Aortic Atherosclerosis  (ICD10-I70.0). Electronically Signed   By: Marijo Conception M.D.   On: 02/14/2022 13:07    Anti-infectives: Anti-infectives (From admission, onward)    Start     Dose/Rate Route Frequency Ordered Stop   02/13/22 2200  cefTRIAXone (ROCEPHIN) 2 g in sodium chloride 0.9 % 100 mL IVPB        2 g 200 mL/hr over 30 Minutes Intravenous Every 24 hours 02/13/22 0039 02/17/22 2159   02/13/22 2200  azithromycin (ZITHROMAX) tablet 500 mg        500 mg Oral Daily at bedtime 02/13/22 0039 02/17/22 2159   02/12/22 2215  ceFEPIme (MAXIPIME) 2 g in sodium chloride 0.9 % 100 mL IVPB  Status:  Discontinued        2 g 200 mL/hr over 30 Minutes Intravenous  Once 02/12/22 2201 02/12/22 2201   02/12/22 2215  cefTRIAXone (ROCEPHIN) 2 g in sodium chloride 0.9 % 100 mL IVPB  Status:  Discontinued        2 g 200 mL/hr over 30 Minutes Intravenous Every 24 hours 02/12/22 2202 02/13/22 0040   02/12/22 2215  azithromycin (ZITHROMAX) 500 mg in sodium chloride 0.9 % 250 mL IVPB        500 mg 250 mL/hr over 60 Minutes Intravenous  Once 02/12/22 2204 02/13/22 0053       Assessment/Plan:   Pt with recurrent hematuria / retention from massive prostate, worsened by catheter trauma. This is now improving after New catheter placed in correct position and on traction to  tamponade bleeding surface.  Continue finasteride now and at DC (I put on Dc meds), wife understands imporgance. Current catheter to stay.     Alexis Frock 02/15/2022

## 2022-02-15 NOTE — Progress Notes (Signed)
  Progress Note   Patient: Francisco Leon DTO:[id number] DOB: 1931/07/08 DOA: 02/12/2022     3 DOS: the patient was seen and examined on 02/15/2022   Brief hospital course: Francisco Leon is a 88 y.o. male with past medical history of hypertension, hyperlipidemia, type 2 diabetes, vascular dementia, hearing loss, BPH presented to hospital with cough and hematuria worsened by for 1 day started 4 days prior to presentation.  He was also having gross hematuria and was supposed to follow-up with urology as outpatient.  In the ED, patient was febrile and mildly hypoxic with tachycardia.  EKG showed sinus tachycardia.  Chest x-ray showed left basilar pneumonia.  Patient was noted to have hypokalemia and leukocytosis.  Lactate was normal.  COVID and influenza PCR negative.  Urine showed moderate 50 RBC/hpf but no bacteria seen.  Blood cultures were collected from the ED patient was given a Rocephin Zithromax and was admitted hospital for further evaluation and treatment.  Assessment/Plan    Sepsis due to pneumonia. Patient presented with cough fever tachycardia leukocytosis and chest x-ray concerning for pneumonia.  Follow-up blood cultures.  Continue Rocephin and Zithromax.  Pending strep pneumo and legionella antigens procalcitonin, follow blood cultures and clinical response to treatment.  Lactate was 1.4.  COVID and influenza negative.  Albumin level was low at 2.5.  Respiratory panel negative for COVID and influenza.  Urine culture pending   Hematuria. Patient was was scheduled to see urology 02/13/22.  No bacteria on urine microscopy.  Type II DM  Continue sliding scale insulin Accu-Cheks.   Essential hypertension  Continue losartan.  Diuretics on hold.   Hyperlipidemia - Continue Lipitor    Dementia  - Delirium precautions, continue Namenda       Assessment and Plan: No notes have been filed under this hospital service. Service: Hospitalist       SubjectiveSeen at bedside;  sleeping, wife at bedside stated he is feeling much better less agitation reported.  Physical Exam: Vitals:   02/14/22 0356 02/14/22 1311 02/14/22 2011 02/15/22 0412  BP: (!) 149/70 (!) 133/55 (!) 105/51 (!) 121/53  Pulse: 65 (!) 56 65 (!) 55  Resp:  (!) '22 14 14  '$ Temp: 98.2 F (36.8 C) (!) 97.5 F (36.4 C) 98.4 F (36.9 C) 98 F (36.7 C)  TempSrc: Oral Oral Oral Oral  SpO2: 95% 95% 96% 98%  Weight:      Height:       Physical Exam Vitals reviewed.  HENT:     Head: Normocephalic.  Eyes:     Pupils: Pupils are equal, round, and reactive to light.  Cardiovascular:     Rate and Rhythm: Normal rate and regular rhythm.  Pulmonary:     Effort: Pulmonary effort is normal.     Breath sounds: Normal breath sounds.  Abdominal:     General: Bowel sounds are normal.     Palpations: Abdomen is soft.  Musculoskeletal:        General: Normal range of motion.     Cervical back: Normal range of motion.     Data Reviewed:  There are no new results to review at this time.  Family Communication: Wife at bedside  Disposition: Status is: Inpatient Remains inpatient appropriate because: Continued medical care she needs physical therapy/rehab  Planned Discharge Destination: Home with Home Health    Time spent: 15 minutes  Author: Cristela Felt, MD 02/15/2022 1:11 PM  For on call review [email or web address].

## 2022-02-15 NOTE — Evaluation (Signed)
Physical Therapy Evaluation Patient Details Name: Francisco Leon MRN: [id number] DOB: [birth year] Today's Date: 02/15/2022  History of Present Illness  Pt admitted from home with sepsis 2* PNA, urinary retention and AKI.  Pt with hx of DM, PVC, hearing deficits and significant vascular dementia  Clinical Impression  Pt admitted as above and presenting with functional mobility limitations 2* balance deficits and dementia related cognitive deficits exacerbated by current sepsis 2* PNA.  Pt's spouse in room and reports pt more alert than yesterday but not yet at baseline but she would appreciate if PT initiated this date.  At baseline, spouse reports pt is ambulatory in home with RW.  This date, pt requiring total assist for transfer to EOB sitting and mod assist to correct for posterior and R lateral lean.  Standing not attempted for safety with pt following no cues.  Pt's spouse states she plans for pt to return home at dc as rehab setting would cause further confusion to pt.  Pt would benefit from follow up HHPT to further address deficits.  Spouse expressing hope that pt ability to participate and mobilize improves as current health issue resolves.     Recommendations for follow up therapy are one component of a multi-disciplinary discharge planning process, led by the attending physician.  Recommendations may be updated based on patient status, additional functional criteria and insurance authorization.  Follow Up Recommendations Home health PT      Assistance Recommended at Discharge Frequent or constant Supervision/Assistance  Patient can return home with the following  Two people to help with walking and/or transfers;A lot of help with bathing/dressing/bathroom;Assistance with cooking/housework;Assist for transportation;Help with stairs or ramp for entrance    Equipment Recommendations None recommended by PT  Recommendations for Other Services       Functional Status Assessment  Patient has had a recent decline in their functional status and demonstrates the ability to make significant improvements in function in a reasonable and predictable amount of time.     Precautions / Restrictions Precautions Precautions: Fall Restrictions Weight Bearing Restrictions: No      Mobility  Bed Mobility Overal bed mobility: Needs Assistance Bed Mobility: Rolling, Sidelying to Sit, Sit to Supine Rolling: Total assist Sidelying to sit: +2 for physical assistance, Total assist   Sit to supine: +2 for physical assistance, Total assist   General bed mobility comments: Multimodal cues but min participation from pt    Transfers                   General transfer comment: Standing not attempted 2* pt retropulsion and R lean in sitting and inability to follow cues    Ambulation/Gait                  Stairs            Wheelchair Mobility    Modified Rankin (Stroke Patients Only)       Balance Overall balance assessment: Needs assistance Sitting-balance support: Feet supported, Bilateral upper extremity supported Sitting balance-Leahy Scale: Zero Sitting balance - Comments: posterior push and R lean Postural control: Posterior lean, Right lateral lean                                   Pertinent Vitals/Pain Pain Assessment Pain Assessment:  (pt stating hurts but unable to indicate where) Breathing: normal Negative Vocalization: none Facial Expression: smiling or inexpressive Body Language: tense,  distressed pacing, fidgeting Consolability: no need to console PAINAD Score: 1 Pain Location: Pt unable to report    [place] expects to be discharged to:: Private residence Living Arrangements: Spouse/significant other Available Help at Discharge: Available PRN/intermittently Type of Home: House Home Access: Level entry       Home Layout: One level Home Equipment: Conservation officer, nature (2 wheels);Transport  chair      Prior Function Prior Level of Function : Needs assist  Cognitive Assist : Mobility (cognitive);ADLs (cognitive)           Mobility Comments: Until day before admit pt was ambulatory with RW ADLs Comments: Assist of spouse     Hand Dominance        Extremity/Trunk Assessment   Upper Extremity Assessment Upper Extremity Assessment: Difficult to assess due to impaired cognition    Lower Extremity Assessment Lower Extremity Assessment: Difficult to assess due to impaired cognition       Communication   Communication: HOH;Expressive difficulties (Pt rambling and frequently unintelligible)  Cognition Arousal/Alertness: Lethargic Behavior During Therapy: Flat affect Overall Cognitive Status: History of cognitive impairments - at baseline                                 General Comments: Spouse present and reports improvement in level of alertness vs yesterday but not back to baseline        General Comments      Exercises     Assessment/Plan    PT Assessment Patient needs continued PT services  PT Problem List         PT Treatment Interventions DME instruction;Gait training;Functional mobility training;Therapeutic activities;Therapeutic exercise;Patient/family education;Balance training    PT Goals (Current goals can be found in the Care Plan section)  Acute Rehab PT Goals Patient Stated Goal: Wife states goal is to return home as independent as possible PT Goal Formulation: With patient Time For Goal Achievement: 02/15/22 Potential to Achieve Goals: Fair    Frequency Min 3X/week     Co-evaluation               AM-PAC PT "6 Clicks" Mobility  Outcome Measure Help needed turning from your back to your side while in a flat bed without using bedrails?: Total Help needed moving from lying on your back to sitting on the side of a flat bed without using bedrails?: Total Help needed moving to and from a bed to a chair (including  a wheelchair)?: Total Help needed standing up from a chair using your arms (e.g., wheelchair or bedside chair)?: Total Help needed to walk in hospital room?: Total Help needed climbing 3-5 steps with a railing? : Total 6 Click Score: 6    End of Session Equipment Utilized During Treatment: Gait belt Activity Tolerance: Patient limited by fatigue;Patient limited by lethargy Patient left: in bed;with call bell/phone within reach;with bed alarm set;with family/visitor present Nurse Communication: Mobility status PT Visit Diagnosis: Difficulty in walking, not elsewhere classified (R26.2)    Time: 8841-6606 PT Time Calculation (min) (ACUTE ONLY): 25 min   Charges:   PT Evaluation $PT Eval Low Complexity: 1 Low PT Treatments $Therapeutic Activity: 8-22 mins        Debe Coder PT Acute Rehabilitation Services Pager [phone] Office [phone]   [place] Quinter 02/15/2022, 2:15 PM

## 2022-02-16 ENCOUNTER — Inpatient Hospital Stay (HOSPITAL_COMMUNITY): Payer: Medicare Other

## 2022-02-16 LAB — COMPREHENSIVE METABOLIC PANEL
ALT: 19 U/L (ref 0–44)
AST: 16 U/L (ref 15–41)
Albumin: 2.7 g/dL — ABNORMAL LOW (ref 3.5–5.0)
Alkaline Phosphatase: 70 U/L (ref 38–126)
Anion gap: 6 (ref 5–15)
BUN: 42 mg/dL — ABNORMAL HIGH (ref 8–23)
CO2: 25 mmol/L (ref 22–32)
Calcium: 9.9 mg/dL (ref 8.9–10.3)
Chloride: 111 mmol/L (ref 98–111)
Creatinine, Ser: 1.66 mg/dL — ABNORMAL HIGH (ref 0.61–1.24)
GFR, Estimated: 39 mL/min — ABNORMAL LOW (ref >60)
Glucose, Bld: 157 mg/dL — ABNORMAL HIGH (ref 70–99)
Potassium: 3.8 mmol/L (ref 3.5–5.1)
Sodium: 142 mmol/L (ref 135–145)
Total Bilirubin: 0.8 mg/dL (ref 0.3–1.2)
Total Protein: 6.4 g/dL — ABNORMAL LOW (ref 6.5–8.1)

## 2022-02-16 LAB — CBC
HCT: 31.4 % — ABNORMAL LOW (ref 39.0–52.0)
Hemoglobin: 10.1 g/dL — ABNORMAL LOW (ref 13.0–17.0)
MCH: 27.9 pg (ref 26.0–34.0)
MCHC: 32.2 g/dL (ref 30.0–36.0)
MCV: 86.7 fL (ref 80.0–100.0)
Platelets: 233 10*3/uL (ref 150–400)
RBC: 3.62 MIL/uL — ABNORMAL LOW (ref 4.22–5.81)
RDW: 13.5 % (ref 11.5–15.5)
WBC: 12.2 10*3/uL — ABNORMAL HIGH (ref 4.0–10.5)
nRBC: 0 % (ref 0.0–0.2)

## 2022-02-16 LAB — BLOOD GAS, ARTERIAL
Acid-Base Excess: 2.4 mmol/L — ABNORMAL HIGH (ref 0.0–2.0)
Bicarbonate: 26.4 mmol/L (ref 20.0–28.0)
Drawn by: 25770
O2 Content: 2 L/min
O2 Saturation: 99.9 %
Patient temperature: 37
pCO2 arterial: 38 mmHg (ref 32–48)
pH, Arterial: 7.45 (ref 7.35–7.45)
pO2, Arterial: 93 mmHg (ref 83–108)

## 2022-02-16 LAB — AMMONIA: Ammonia: 27 umol/L (ref 9–35)

## 2022-02-16 LAB — GLUCOSE, CAPILLARY
Glucose-Capillary: 147 mg/dL — ABNORMAL HIGH (ref 70–99)
Glucose-Capillary: 150 mg/dL — ABNORMAL HIGH (ref 70–99)
Glucose-Capillary: 160 mg/dL — ABNORMAL HIGH (ref 70–99)
Glucose-Capillary: 191 mg/dL — ABNORMAL HIGH (ref 70–99)

## 2022-02-16 MED ORDER — CHLORHEXIDINE GLUCONATE CLOTH 2 % EX PADS
6.0000 | MEDICATED_PAD | Freq: Every day | CUTANEOUS | Status: DC
  Administered 2022-02-16 – 2022-02-25 (×10): 6 via TOPICAL

## 2022-02-16 NOTE — Progress Notes (Signed)
Subjective/Chief Complaint:  1 - Gross Hematuria - long h/o on off hematuria on / off from presemed BPH since 2021. CT 2021 and cysto w/o neoplasia. UCX 10/25 staph (?contaminant), now on rocpehin.    2 - Urinary Retention / Acute Renal Failure - on tamsulosin at baseline BID with Dr. Lovena Neighbours. Baseline Cr <1.2 with rise to 2s during hospitalization for pneumonia and noted to be in retention. Small bore catheter placed. CT 10/27 with malpositioned catheter, balloon in massive 200gm prostate. Restarted on finasteride and new large bore 3 way catheter placed to traction and slow irrigation. Irrigation stoppedn 10/28.    Today " Mikki Santee" is stable. Hematuria essentially resolved, no irrigation in over 24 hours.    Objective: Vital signs in last 24 hours: Temp:  [97.7 F (36.5 C)-98.5 F (36.9 C)] 98.3 F (36.8 C) (10/29 0637) Pulse Rate:  [49-78] 49 (10/29 0637) Resp:  [18] 18 (10/29 0637) BP: (142-162)/(61-96) 142/61 (10/29 0637) SpO2:  [96 %-97 %] 97 % (10/29 1610) Weight:  [80.8 kg] 80.8 kg (10/29 0500) Last BM Date : 02/15/22  Intake/Output from previous day: 10/28 0701 - 10/29 0700 In: 3 [I.V.:3] Out: 800 [Urine:800] Intake/Output this shift: No intake/output data recorded.  Physical Exam Vitals reviewed.  Constitutional:      Comments: AOx0 in hospital bed. Non-combative. In mittens. Wife at bedside.  HENT:     Head: Normocephalic.  Eyes:     Pupils: Pupils are equal, round, and reactive to light.  Cardiovascular:     Rate and Rhythm: Normal rate.  Pulmonary:     Effort: Pulmonary effort is normal.  Abdominal:     General: Abdomen is flat.  Genitourinary:    Comments: In situ large 3 way catheter  in place off irrigation on catheter strap traction, medium yellow urine with small debris. Continues to improve. Musculoskeletal:        General: Normal range of motion.     Comments: In soft mittents.   Neurological:     General: No focal deficit present.   Lab  Results:  Recent Labs    02/14/22 0532 02/15/22 0536  WBC 14.0* 15.3*  HGB 10.8* 11.2*  HCT 32.6* 34.1*  PLT 244 265   BMET Recent Labs    02/14/22 0532 02/15/22 0536  NA 133* 140  K 3.8 4.0  CL 105 109  CO2 22 24  GLUCOSE 169* 140*  BUN 31* 37*  CREATININE 2.18* 1.84*  CALCIUM 10.1 10.4*   PT/INR No results for input(s): "LABPROT", "INR" in the last 72 hours. ABG No results for input(s): "PHART", "HCO3" in the last 72 hours.  Invalid input(s): "PCO2", "PO2"  Studies/Results: CT ABDOMEN PELVIS WO CONTRAST  Result Date: 02/14/2022 CLINICAL DATA:  Acute hematuria, acute renal failure. EXAM: CT ABDOMEN AND PELVIS WITHOUT CONTRAST TECHNIQUE: Multidetector CT imaging of the abdomen and pelvis was performed following the standard protocol without IV contrast. RADIATION DOSE REDUCTION: This exam was performed according to the departmental dose-optimization program which includes automated exposure control, adjustment of the mA and/or kV according to patient size and/or use of iterative reconstruction technique. COMPARISON:  October 21, 2019. FINDINGS: Lower chest: Small bilateral pleural effusions are noted with mild adjacent subsegmental atelectasis. Hepatobiliary: No focal liver abnormality is seen. Status post cholecystectomy. No biliary dilatation. Pancreas: Diffuse pancreatic atrophy is noted. No acute inflammation is noted. 1.9 cm rounded density is noted in pancreatic tail with water density. Spleen: Normal in size without focal abnormality. Adrenals/Urinary Tract: Adrenal  glands appear normal. Stable bilateral renal cysts are noted for which no further follow-up is required. Severe left hydroureteronephrosis is noted without obstructing calculus. Severely enlarged prostate gland is noted which extends into the urinary bladder and potentially may be the cause of obstruction. Layering high density material is noted in the urinary bladder which may represent hemorrhage. Small  nonobstructive right renal calculus is noted. Mild right hydroureteronephrosis is noted also without obstructing calculus. Moderate urinary bladder distention is noted suggesting bladder bladder outlet obstruction. Foley catheter is noted with balloon inflated within the enlarged prostate gland. Distal tip is also still within the prostatic urethra. Stomach/Bowel: Stomach is within normal limits. Appendix appears normal. No evidence of bowel wall thickening, distention, or inflammatory changes. Stool is noted throughout the colon including large amount of stool in rectum. Vascular/Lymphatic: Aortic atherosclerosis. No enlarged abdominal or pelvic lymph nodes. Reproductive: As noted above, severe prostate enlargement is noted with extension in the urinary bladder. Other: No abdominal wall hernia or abnormality. No abdominopelvic ascites. Musculoskeletal: No acute or significant osseous findings. IMPRESSION: Severe left hydroureteronephrosis is noted as well as mild right hydroureteronephrosis, without evidence of obstructing calculus. Also noted is moderate urinary bladder distention. This appears to be due to bladder outlet obstruction secondary to severely enlarged prostate gland and extends into the inferior and posterior portion of the urinary bladder. Foley catheter is noted, but balloon is inflated within the prostate gland in the distal tip of the catheter is also still within the prostatic urethra. Layering high density material is noted in the urinary bladder concerning for hemorrhage. 1.9 cm rounded water density abnormality is noted in pancreatic tail which is not significantly enlarged compared to prior exam. Continued follow-up is recommended. Small bilateral pleural effusions are noted with adjacent subsegmental atelectasis. Aortic Atherosclerosis (ICD10-I70.0). Electronically Signed   By: Marijo Conception M.D.   On: 02/14/2022 13:07    Anti-infectives: Anti-infectives (From admission, onward)     Start     Dose/Rate Route Frequency Ordered Stop   02/13/22 2200  cefTRIAXone (ROCEPHIN) 2 g in sodium chloride 0.9 % 100 mL IVPB        2 g 200 mL/hr over 30 Minutes Intravenous Every 24 hours 02/13/22 0039 02/17/22 2159   02/13/22 2200  azithromycin (ZITHROMAX) tablet 500 mg        500 mg Oral Daily at bedtime 02/13/22 0039 02/17/22 2159   02/12/22 2215  ceFEPIme (MAXIPIME) 2 g in sodium chloride 0.9 % 100 mL IVPB  Status:  Discontinued        2 g 200 mL/hr over 30 Minutes Intravenous  Once 02/12/22 2201 02/12/22 2201   02/12/22 2215  cefTRIAXone (ROCEPHIN) 2 g in sodium chloride 0.9 % 100 mL IVPB  Status:  Discontinued        2 g 200 mL/hr over 30 Minutes Intravenous Every 24 hours 02/12/22 2202 02/13/22 0040   02/12/22 2215  azithromycin (ZITHROMAX) 500 mg in sodium chloride 0.9 % 250 mL IVPB        500 mg 250 mL/hr over 60 Minutes Intravenous  Once 02/12/22 2204 02/13/22 0053       Assessment/Plan:   Pt with recurrent hematuria / retention from massive prostate, worsened by catheter trauma. This is now improving after New catheter placed in correct position and on traction to tamponade bleeding surface.  Continue finasteride now and at DC (I put on Dc meds). Current catheter to stay.      Alexis Frock 02/16/2022

## 2022-02-16 NOTE — Progress Notes (Signed)
  Progress Note   Patient: Francisco Leon DOB: 06/22/1931 DOA: 02/12/2022     4 DOS: the patient was seen and examined on 02/16/2022   Brief hospital course: Francisco Leon is a 88 y.o. male with past medical history of hypertension, hyperlipidemia, type 2 diabetes, vascular dementia, hearing loss, BPH presented to hospital with cough and hematuria worsened by for 1 day started 4 days prior to presentation.  He was also having gross hematuria and was supposed to follow-up with urology as outpatient.  In the ED, patient was febrile and mildly hypoxic with tachycardia.  EKG showed sinus tachycardia.  Chest x-ray showed left basilar pneumonia.  Patient was noted to have hypokalemia and leukocytosis.  Lactate was normal.  COVID and influenza PCR negative.  Urine showed moderate 50 RBC/hpf but no bacteria seen.  Blood cultures were collected from the ED patient was given a Rocephin Zithromax and was admitted hospital for further evaluation and treatment.  Assessment and Plan: Sepsis due to pneumonia. Patient presented with cough fever tachycardia leukocytosis and chest x-ray concerning for pneumonia.  Respiratory panel, COVID influenza A*B negative. Procalcitonin 0.71. Blood cultures have been negative so far. Urine Cultures growing group B strep. Patient is on ceftriaxone and azithromycin.  Altered mental status -In Setting of ongoing dementia continue supportive care. Check CT head  ABG -Pco2 not elevated Ammonia level within normal limits.   Hematuria. Improved, possibly due to UTI/cystitis Urology following urine has cleared up  CT A/P--Severe left hydroureteronephrosis is noted as well as mild right hydroureteronephrosis, without evidence of obstructing calculus  AKI -Improved, currently at 1.6.    Type II DM  Continue sliding scale insulin Accu-Cheks.   Essential hypertension  Continue losartan.  Diuretics on hold.   Hyperlipidemia - Continue Lipitor     Dementia  - Delirium precautions, continue Namenda       Subjective: Remains lethargic, Per RN, patients wife fed him some food and he developed cough. Checking CXR.  Physical Exam: HENT:     Head: Normocephalic.  Eyes:     Pupils: Pupils are equal, round, and reactive to light.  Cardiovascular:     Rate and Rhythm: Normal rate and regular rhythm.  Pulmonary:     Effort: Pulmonary effort is normal.     Breath sounds: Normal breath sounds.  Abdominal:     General: Bowel sounds are normal.     Palpations: Abdomen is soft.  Musculoskeletal:        General: Normal range of motion.     Cervical back: Normal range of motion.   Vitals:   02/15/22 1330 02/15/22 2025 02/16/22 0500 02/16/22 0637  BP: (!) 146/63 (!) 162/96  (!) 142/61  Pulse: 76 78  (!) 49  Resp: '18 18  18  '$ Temp: 97.7 F (36.5 C) 98.5 F (36.9 C)  98.3 F (36.8 C)  TempSrc: Oral Oral  Oral  SpO2: 96% 96%  97%  Weight:   80.8 kg   Height:        Data Reviewed:  There are no new results to review at this time.  Family Communication:   Disposition: Status is: Inpatient Remains inpatient appropriate because:   Planned Discharge Destination: Skilled nursing facility    Time spent: 32  minutes  Author: Cristela Felt, MD 02/16/2022 1:10 PM  For on call review [email or web address].

## 2022-02-16 NOTE — Progress Notes (Signed)
Notified Lab that ABG being sent for analysis. 

## 2022-02-17 DIAGNOSIS — J189 Pneumonia, unspecified organism: Secondary | ICD-10-CM | POA: Diagnosis not present

## 2022-02-17 DIAGNOSIS — A419 Sepsis, unspecified organism: Secondary | ICD-10-CM | POA: Diagnosis not present

## 2022-02-17 LAB — CBC
HCT: 31.2 % — ABNORMAL LOW (ref 39.0–52.0)
Hemoglobin: 10.5 g/dL — ABNORMAL LOW (ref 13.0–17.0)
MCH: 28.3 pg (ref 26.0–34.0)
MCHC: 33.7 g/dL (ref 30.0–36.0)
MCV: 84.1 fL (ref 80.0–100.0)
Platelets: 215 10*3/uL (ref 150–400)
RBC: 3.71 MIL/uL — ABNORMAL LOW (ref 4.22–5.81)
RDW: 13.3 % (ref 11.5–15.5)
WBC: 14.1 10*3/uL — ABNORMAL HIGH (ref 4.0–10.5)
nRBC: 0 % (ref 0.0–0.2)

## 2022-02-17 LAB — COMPREHENSIVE METABOLIC PANEL
ALT: 32 U/L (ref 0–44)
AST: 24 U/L (ref 15–41)
Albumin: 2.9 g/dL — ABNORMAL LOW (ref 3.5–5.0)
Alkaline Phosphatase: 75 U/L (ref 38–126)
Anion gap: 7 (ref 5–15)
BUN: 57 mg/dL — ABNORMAL HIGH (ref 8–23)
CO2: 23 mmol/L (ref 22–32)
Calcium: 9.9 mg/dL (ref 8.9–10.3)
Chloride: 109 mmol/L (ref 98–111)
Creatinine, Ser: 2.49 mg/dL — ABNORMAL HIGH (ref 0.61–1.24)
GFR, Estimated: 24 mL/min — ABNORMAL LOW (ref >60)
Glucose, Bld: 162 mg/dL — ABNORMAL HIGH (ref 70–99)
Potassium: 3.6 mmol/L (ref 3.5–5.1)
Sodium: 139 mmol/L (ref 135–145)
Total Bilirubin: 0.9 mg/dL (ref 0.3–1.2)
Total Protein: 6.8 g/dL (ref 6.5–8.1)

## 2022-02-17 LAB — GLUCOSE, CAPILLARY
Glucose-Capillary: 139 mg/dL — ABNORMAL HIGH (ref 70–99)
Glucose-Capillary: 152 mg/dL — ABNORMAL HIGH (ref 70–99)
Glucose-Capillary: 150 mg/dL — ABNORMAL HIGH (ref 70–99)
Glucose-Capillary: 149 mg/dL — ABNORMAL HIGH (ref 70–99)

## 2022-02-17 LAB — C-REACTIVE PROTEIN: CRP: 7.4 mg/dL — ABNORMAL HIGH (ref <1.0)

## 2022-02-17 MED ORDER — TAMSULOSIN HCL 0.4 MG PO CAPS
0.4000 mg | ORAL_CAPSULE | Freq: Two times a day (BID) | ORAL | Status: DC
  Administered 2022-02-17 – 2022-02-25 (×16): 0.4 mg via ORAL
  Filled 2022-02-17 (×15): qty 1

## 2022-02-17 MED ORDER — LORAZEPAM 2 MG/ML IJ SOLN
0.5000 mg | Freq: Once | INTRAMUSCULAR | Status: AC
  Administered 2022-02-17: 0.5 mg via INTRAVENOUS
  Filled 2022-02-17: qty 1

## 2022-02-17 MED ORDER — SODIUM CHLORIDE 0.9 % IV SOLN: INTRAVENOUS | Status: DC

## 2022-02-17 MED ORDER — QUETIAPINE FUMARATE 25 MG PO TABS
25.0000 mg | ORAL_TABLET | Freq: Two times a day (BID) | ORAL | Status: DC
  Administered 2022-02-17 – 2022-02-18 (×3): 25 mg via ORAL
  Filled 2022-02-17 (×3): qty 1

## 2022-02-17 MED ORDER — MELATONIN 3 MG PO TABS
3.0000 mg | ORAL_TABLET | Freq: Every day | ORAL | Status: DC
  Administered 2022-02-17 – 2022-02-24 (×8): 3 mg via ORAL
  Filled 2022-02-17 (×8): qty 1

## 2022-02-17 NOTE — Care Management Important Message (Signed)
Important Message  Patient Details IM Letter placed in Patients room. Name: Francisco Leon MRN: [id number] Date of Birth: Apr 10, 1932   Medicare Important Message Given:  Yes     Kerin Salen 02/17/2022, 1:33 PM

## 2022-02-17 NOTE — Progress Notes (Signed)
PT Cancellation Note  Patient Details Name: MARKO SKALSKI MRN: [id number] DOB: [birth year]   Cancelled Treatment:    Reason Eval/Treat Not Completed: Other (comment) (pt very confused, mumbling in bed, wife reports he didn't sleep at all last night and has been talking non stop, wife requested PT attempt at another time as pt is not able to participate at present. Will follow.)   Philomena Doheny PT 02/17/2022  Acute Rehabilitation Services  Office [phone]

## 2022-02-17 NOTE — TOC Initial Note (Signed)
Transition of Care (TOC) - Initial/Assessment Note    Patient Details  Name: Francisco Leon MRN: [id number] Date of Birth: 06/04/1931  Transition of Care (TOC) CM/SW Contact:     A , LCSW Phone Number: 02/17/2022, 11:36 AM  Clinical Narrative:                 Met with pt's wife at bedside who is agreeable to having HHPT arranged. She reports that this pt has not had home health in the past. Pt has RW at home and wife denies having any additional DME needs. Pt's wife declines need for additional home health services.  Home health PT has been arranged with [facility]. Home health orders will need to be placed prior to discharge.   Expected Discharge Plan: Home w Home Health Services Barriers to Discharge: Continued Medical Work up   Patient Goals and CMS Choice Patient states their goals for this hospitalization and ongoing recovery are:: Per wife: For pt to return home CMS Medicare.gov Compare Post Acute Care list provided to:: Patient Represenative (must comment) (Spouse) Choice offered to / list presented to : Spouse  Expected Discharge Plan and Services Expected Discharge Plan: Home w Home Health Services In-house Referral: NA Discharge Planning Services: NA Post Acute Care Choice: Home Health Living arrangements for the past 2 months: Single Family Home                 DME Arranged: N/A DME Agency: NA       HH Arranged: PT HH Agency: [facility] Date HH Agency Contacted: 02/17/22 Time HH Agency Contacted: 1133 Representative spoke with at HH Agency: Angela  Prior Living Arrangements/Services Living arrangements for the past 2 months: Single Family Home Lives with:: Spouse Patient language and need for interpreter reviewed:: Yes Do you feel safe going back to the place where you live?: Yes      Need for Family Participation in Patient Care: Yes (Comment) Care giver support system in place?: No (comment) Current home services:  DME Criminal Activity/Legal Involvement Pertinent to Current Situation/Hospitalization: No - Comment as needed  Activities of Daily Living Home Assistive Devices/Equipment: None ADL Screening (condition at time of admission) Is the patient deaf or have difficulty hearing?: Yes Does the patient have difficulty seeing, even when wearing glasses/contacts?: No Does the patient have difficulty concentrating, remembering, or making decisions?: Yes Does the patient have difficulty dressing or bathing?: Yes Does the patient have difficulty walking or climbing stairs?: Yes  Permission Sought/Granted Permission sought to share information with : Family Supports, Case Manager Permission granted to share information with : Yes, Verbal Permission Granted     Permission granted to share info w AGENCY: Home Health Agency        Emotional Assessment Appearance:: Appears stated age Attitude/Demeanor/Rapport: Unable to Assess Affect (typically observed): Unable to Assess Orientation: : Oriented to Self Alcohol / Substance Use: Not Applicable Psych Involvement: No (comment)  Admission diagnosis:  Sepsis due to pneumonia (HCC) [J18.9, A41.9] Community acquired pneumonia, unspecified laterality [J18.9] Sepsis, due to unspecified organism, unspecified whether acute organ dysfunction present (HCC) [A41.9] Patient Active Problem List   Diagnosis Date Noted   Diabetes mellitus without complication (HCC) 02/13/2022   Hypertension 02/13/2022   Pure hypercholesterolemia 02/13/2022   Dementia (HCC) 02/13/2022   Hypokalemia 02/13/2022   Hematuria 02/13/2022   Sepsis due to pneumonia (HCC) 02/12/2022   PCP:  Hammer, Eli, MD Pharmacy:   [id number] - [place], [place] - [address] [place] [address] Phone: [phone] Fax: [phone]     Social Determinants of Health (SDOH) Interventions    Readmission Risk  Interventions    02/17/2022   11:32 AM  Readmission Risk Prevention Plan  Transportation Screening Complete  PCP or Specialist Appt within 5-7 Days Complete  Home Care Screening Complete  Medication Review (RN CM) Complete     

## 2022-02-17 NOTE — Progress Notes (Addendum)
PT Cancellation Note  Patient Details Name: Francisco Leon MRN: [id number] DOB: 04/08/1932   Cancelled Treatment:    Reason Eval/Treat Not Completed: Patient's level of consciousness (pt with eyes closed in bed, mumbling incoherently, does not open eyes to verbal/tactile stimulii. Did not respond when asked his birthdate. Wife stated pt did not sleep at all last night. Unable to participate in PT at present. Will follow.)  In his current state, it is unrealistic for wife to be able to care for pt at home. Unless his cognition and mobility significantly improve, he will need SNF.    Blondell Reveal Kistler PT 02/17/2022  Acute Rehabilitation Services  Office [phone]

## 2022-02-17 NOTE — Progress Notes (Signed)
PROGRESS NOTE  Francisco Leon  DOB: Jun 03, 1931  PCP: Collene Leyden, MD ZJQ:[id number]  DOA: 02/12/2022  LOS: 5 days  Hospital Day: 6  Brief narrative: Francisco Leon is a 88 y.o. male with PMH significant for DM2, HTN, HLD, vascular dementia, BPH 10/25, patient presented to the ED with complaint of fever, cough as well as hematuria for 4 days.  In the ED, patient was mildly hypoxic and tachycardic Chest x-ray showed possible left basilar pneumonia COVID/flu negative Labs showed leukocytosis, hypokalemia, normal lactate Urinalysis showed moderate 50 RBC/hpf without bacteria.   Blood culture sent from ED and patient was started on Rocephin and Zithromax  In the ED, patient was noted to be in urinary retention.  Smallbore catheter was placed.  CT A/P showed severe left hydroureteronephrosis, mild right hydroureteronephrosis, without evidence of obstructing calculus.  It also showed severe enlarged prostate, moderate urinary bladder distention.  Foley catheter that was placed earlier in the ED was noted to be misplaced and inflated within the prostate.. Urology consultation was obtained.  Coud catheter was inserted and patient was started on slow irrigation. Admitted to [facility]  Subjective: Patient was seen and examined this morning.  Pleasant elderly Caucasian male.  Delirious.  Able to follow some commands after multiple redirections by wife at bedside.  Per wife, he has not had a sleep in last 2 days.  He has never been delirious like this before. Hematuria seems to be resolving.  Has consented to urine in Foley catheter. Chart reviewed Remains afebrile, blood pressure elevated to 150s and 160s, breathing on room air  Assessment and plan: Sepsis POA due to pneumonia and UTI Met sepsis criteria with fever, cough, tachycardia, leukocytosis Blood cultures have been negative so far. WBC count improving with IV Rocephin and azithromycin. Recent Labs  Lab 02/12/22 2210 02/13/22 0407  02/14/22 0532 02/15/22 0536 02/16/22 0856 02/17/22 0518  WBC 19.2* 11.8* 14.0* 15.3* 12.2* 14.1*  LATICACIDVEN 1.4  --   --   --   --   --   PROCALCITON <0.10  --  0.71  --   --   --    Left basilar pneumonia Respiratory panel, COVID influenza A*B negative. Procalcitonin was elevated to 0.71. Completed 5-day course of Rocephin and azithromycin. Respiratory status normal, currently breathing on room air   UTI with hematuria Urine culture grew more than 1000 CFU per mL of strep agalactiae Completed 5-day course of antibiotics  Gross hematuria Severe left hydroureteronephrosis, severe prostatomegaly Per urology note, patient has long history of off-and-on hematuria from presumed BPH since 2021.  Follows up with Dr. Gilford Rile as an outpatient.   In the ED, coud catheter was placed and patient was started on slow irrigation.  Hematuria seems to have resolved.  Not on blood thinners. Currently on finasteride 5 mg daily and Flomax 0.4 mg twice daily  AKI Creatinine elevated to 2.18 at presentation.  Likely from a combination of sepsis as well as urinary obstruction as detailed above. Creatinine initially improved but worsened again on labs obtained this morning which showed creatinine of 2.49.  This is likely because of poor mental status causing dehydration. Start IV hydration with normal saline at 75 mill per hour Recent Labs    02/12/22 2210 02/13/22 0407 02/14/22 0532 02/15/22 0536 02/16/22 0856 02/17/22 0518  BUN 19 17 31* 37* 42* 57*  CREATININE 1.10 1.00 2.18* 1.84* 1.66* 2.49*   Altered mental status Underlying mild dementia Patient mental status has been worsening for the  last 48 hours it seems.  Per wife, he has not slept for last 2 nights.  He has required as needed IV Ativan. 10/29, CT head did not show any acute intracranial malady but showed moderate atrophy as well as chronic microvascular ischemia. Medications reviewed. Continue Namenda, Lexapro.  Stop baclofen.   Start melatonin scheduled nightly.  Also started on Seroquel 25 mg twice daily this morning.  Continue supportive care for dementia  Type 2 diabetes mellitus A1c 6.5 on 10/25 PTA on glimepiride 2 mg daily, metformin 500 mg daily.  Currently both on hold. Currently on sliding scale insulin with Accu-Cheks.  Blood sugar level reasonably controlled Recent Labs  Lab 02/16/22 1155 02/16/22 1655 02/16/22 2035 02/17/22 0752 02/17/22 1117  GLUCAP 150* 160* 191* 139* 152*   Essential hypertension  PTA on losartan 50 mg daily, Aldactone 25 mg daily, HCTZ 25 mg daily.  Currently meds on hold because of AKI Continue to monitor blood pressure.  Continue hydralazine IV as needed.   Hyperlipidemia Continue Lipitor   Goals of care   Code Status: Full Code    Mobility: Encourage ambulation once mental status improved  Skin assessment:     Nutritional status:  Body mass index is 28.64 kg/m.          Diet:  Diet Order             Diet regular Room service appropriate? Yes; Fluid consistency: Thin  Diet effective now                   DVT prophylaxis:  SCDs Start: 02/13/22 0047   Antimicrobials: Completed the course of antibiotics Fluid: NS at 50 mill per hour Consultants: None Family Communication: Wife at bedside  Status is: Inpatient  Continue in-hospital care because: Poor mental status, delirious, worsening kidney function Level of care: Telemetry   Dispo: The patient is from: Home              Anticipated d/c is to: Pending clinical course              Patient currently is not medically stable to d/c.   Difficult to place patient No     Infusions:   sodium chloride 75 mL/hr at 02/17/22 1028    Scheduled Meds:  atorvastatin  10 mg Oral QHS   Chlorhexidine Gluconate Cloth  6 each Topical Daily   escitalopram  10 mg Oral Daily   finasteride  5 mg Oral Daily   insulin aspart  0-5 Units Subcutaneous QHS   insulin aspart  0-6 Units Subcutaneous TID WC    melatonin  3 mg Oral QHS   memantine  10 mg Oral BID   mouth rinse  15 mL Mouth Rinse 4 times per day   QUEtiapine  25 mg Oral BID   sodium chloride  1 drop Both Eyes QHS   sodium chloride flush  3 mL Intravenous Q12H   tamsulosin  0.4 mg Oral BID    PRN meds: acetaminophen **OR** acetaminophen, hydrALAZINE, ondansetron **OR** ondansetron (ZOFRAN) IV, mouth rinse, senna-docusate   Antimicrobials: Anti-infectives (From admission, onward)    Start     Dose/Rate Route Frequency Ordered Stop   02/13/22 2200  cefTRIAXone (ROCEPHIN) 2 g in sodium chloride 0.9 % 100 mL IVPB        2 g 200 mL/hr over 30 Minutes Intravenous Every 24 hours 02/13/22 0039 02/17/22 0649   02/13/22 2200  azithromycin (ZITHROMAX) tablet 500 mg  500 mg Oral Daily at bedtime 02/13/22 0039 02/16/22 2206   02/12/22 2215  ceFEPIme (MAXIPIME) 2 g in sodium chloride 0.9 % 100 mL IVPB  Status:  Discontinued        2 g 200 mL/hr over 30 Minutes Intravenous  Once 02/12/22 2201 02/12/22 2201   02/12/22 2215  cefTRIAXone (ROCEPHIN) 2 g in sodium chloride 0.9 % 100 mL IVPB  Status:  Discontinued        2 g 200 mL/hr over 30 Minutes Intravenous Every 24 hours 02/12/22 2202 02/13/22 0040   02/12/22 2215  azithromycin (ZITHROMAX) 500 mg in sodium chloride 0.9 % 250 mL IVPB        500 mg 250 mL/hr over 60 Minutes Intravenous  Once 02/12/22 2204 02/13/22 0053       Objective: Vitals:   02/17/22 0757 02/17/22 0800  BP: (!) 152/67   Pulse: 68   Resp: 20 16  Temp: 98.8 F (37.1 C)   SpO2: 93%     Intake/Output Summary (Last 24 hours) at 02/17/2022 1158 Last data filed at 02/17/2022 0757 Gross per 24 hour  Intake --  Output 100 ml  Net -100 ml   Filed Weights   02/15/22 0500 02/16/22 0500 02/17/22 0500  Weight: 83 kg 80.8 kg 80.5 kg   Weight change: -0.3 kg Body mass index is 28.64 kg/m.   Physical Exam: General exam: Pleasant, elderly Caucasian male.  Not in physical distress.  Delirious Skin: No  rashes, lesions or ulcers. HEENT: Atraumatic, normocephalic, no obvious bleeding Lungs: Clear to auscultation bilaterally CVS: Regular rate and rhythm, no murmur GI/Abd soft, nontender, nondistended, bowel sound present CNS: Alert, awake, totally disoriented, needs multiple redirection to follow commands Psychiatry: Sad affect Extremities: No pedal edema, no calf tenderness  Data Review: I have personally reviewed the laboratory data and studies available.  F/u labs ordered Unresulted Labs (From admission, onward)     Start     Ordered   02/18/22 0500  CBC with Differential/Platelet  Tomorrow morning,   R       Question:  Specimen collection method  Answer:  Lab=Lab collect   02/17/22 1158   02/18/22 9163  Basic metabolic panel  Tomorrow morning,   R       Question:  Specimen collection method  Answer:  Lab=Lab collect   02/17/22 1158   02/13/22 0039  Expectorated Sputum Assessment w Gram Stain, Rflx to Resp Cult  (COPD / Pneumonia / Cellulitis / Lower Extremity Wound)  Once,   R        02/13/22 0039            Signed, Terrilee Croak, MD [facility] 02/17/2022

## 2022-02-17 NOTE — Plan of Care (Signed)

## 2022-02-18 ENCOUNTER — Other Ambulatory Visit: Payer: Self-pay

## 2022-02-18 DIAGNOSIS — J189 Pneumonia, unspecified organism: Secondary | ICD-10-CM | POA: Diagnosis not present

## 2022-02-18 DIAGNOSIS — A419 Sepsis, unspecified organism: Secondary | ICD-10-CM | POA: Diagnosis not present

## 2022-02-18 LAB — BASIC METABOLIC PANEL
Anion gap: 7 (ref 5–15)
BUN: 61 mg/dL — ABNORMAL HIGH (ref 8–23)
CO2: 22 mmol/L (ref 22–32)
Calcium: 9.6 mg/dL (ref 8.9–10.3)
Chloride: 113 mmol/L — ABNORMAL HIGH (ref 98–111)
Creatinine, Ser: 2.82 mg/dL — ABNORMAL HIGH (ref 0.61–1.24)
GFR, Estimated: 21 mL/min — ABNORMAL LOW (ref >60)
Glucose, Bld: 142 mg/dL — ABNORMAL HIGH (ref 70–99)
Potassium: 3.6 mmol/L (ref 3.5–5.1)
Sodium: 142 mmol/L (ref 135–145)

## 2022-02-18 LAB — CBC WITH DIFFERENTIAL/PLATELET
Abs Immature Granulocytes: 0.17 10*3/uL — ABNORMAL HIGH (ref 0.00–0.07)
Basophils Absolute: 0 10*3/uL (ref 0.0–0.1)
Basophils Relative: 0 %
Eosinophils Absolute: 0.4 10*3/uL (ref 0.0–0.5)
Eosinophils Relative: 3 %
HCT: 30.4 % — ABNORMAL LOW (ref 39.0–52.0)
Hemoglobin: 9.6 g/dL — ABNORMAL LOW (ref 13.0–17.0)
Immature Granulocytes: 1 %
Lymphocytes Relative: 10 %
Lymphs Abs: 1.3 10*3/uL (ref 0.7–4.0)
MCH: 28 pg (ref 26.0–34.0)
MCHC: 31.6 g/dL (ref 30.0–36.0)
MCV: 88.6 fL (ref 80.0–100.0)
Monocytes Absolute: 1.5 10*3/uL — ABNORMAL HIGH (ref 0.1–1.0)
Monocytes Relative: 11 %
Neutro Abs: 9.6 10*3/uL — ABNORMAL HIGH (ref 1.7–7.7)
Neutrophils Relative %: 75 %
Platelets: 184 10*3/uL (ref 150–400)
RBC: 3.43 MIL/uL — ABNORMAL LOW (ref 4.22–5.81)
RDW: 13.6 % (ref 11.5–15.5)
WBC: 12.9 10*3/uL — ABNORMAL HIGH (ref 4.0–10.5)
nRBC: 0 % (ref 0.0–0.2)

## 2022-02-18 LAB — CULTURE, BLOOD (ROUTINE X 2)
Culture: NO GROWTH
Culture: NO GROWTH

## 2022-02-18 LAB — GLUCOSE, CAPILLARY
Glucose-Capillary: 144 mg/dL — ABNORMAL HIGH (ref 70–99)
Glucose-Capillary: 157 mg/dL — ABNORMAL HIGH (ref 70–99)
Glucose-Capillary: 139 mg/dL — ABNORMAL HIGH (ref 70–99)
Glucose-Capillary: 145 mg/dL — ABNORMAL HIGH (ref 70–99)

## 2022-02-18 MED ORDER — QUETIAPINE FUMARATE 25 MG PO TABS
25.0000 mg | ORAL_TABLET | Freq: Every day | ORAL | Status: DC
  Administered 2022-02-19 – 2022-02-24 (×6): 25 mg via ORAL
  Filled 2022-02-18 (×6): qty 1

## 2022-02-18 NOTE — Progress Notes (Signed)
Chaplain met with Bob's wife to provide support.  She is trying to stay positive and hoping that he will be able to get back to walking so that he can return home.  She has very good family support including a daughter and son who live with them.  She has another daughter in [place] who may be coming home on Thursday.  Chaplain joined [place] in prayer.  [address], [place] Pager, [phone]

## 2022-02-18 NOTE — Progress Notes (Signed)
Subjective/Chief Complaint:  1 - Gross Hematuria - long h/o on off hematuria on / off from presemed BPH since 2021. CT 2021 and cysto w/o neoplasia. UCX 10/25 staph (?contaminant), now on rocpehin.    2 - Urinary Retention / Acute Renal Failure - on tamsulosin at baseline BID with Dr. Lovena Neighbours. Baseline Cr <1.2 with rise to 2s during hospitalization for pneumonia and noted to be in retention. Small bore catheter placed. CT 10/27 with malpositioned catheter, balloon in massive 200gm prostate. Restarted on finasteride and large bore 3 way catheter placed to traction and slow irrigation. Irrigation stopped 10/28. Hematuria recurred 02/18/22, unclear circumstances, clot retention. Patency restored following manual irrigation and CBI resumed.    Catheter clotted off on arrival, unable to manually irrigate. Balloon taken down, catheter advanced into bladder with immediate return of wine-colored urine. Manual irrigation with 2L NS retrieved ~200cc dark clot, urine irrigated to light pink. CBI resumed on 2gtt/sec  Objective: Vital signs in last 24 hours: Temp:  [98 F (36.7 C)-98.2 F (36.8 C)] 98 F (36.7 C) (10/31 1358) Pulse Rate:  [50-60] 50 (10/31 1358) Resp:  [16-19] 16 (10/31 1358) BP: (124-138)/(56-60) 124/56 (10/31 1358) SpO2:  [92 %-95 %] 94 % (10/31 1358) Last BM Date : 02/17/22  Intake/Output from previous day: 10/30 0701 - 10/31 0700 In: 544.8 [P.O.:236; I.V.:308.8] Out: 250 [Urine:250] Intake/Output this shift: Total I/O In: -  Out: 2300 [Urine:2300]  Physical Exam Vitals reviewed.  Constitutional:      Comments: AOx0 in hospital bed. Non-combative. Grandson at bedside HENT:     Head: Normocephalic.  Eyes:     Pupils: Pupils are equal, round, and reactive to light.  Cardiovascular:     Rate and Rhythm: Normal rate.  Pulmonary:     Effort: Pulmonary effort is normal.  Abdominal:     General: Abdomen is flat.  Genitourinary:    Comments: 22Fr 3-way in place with  CBI on moderate gtt, urine light pink Musculoskeletal:        General: Normal range of motion.   Neurological:     General: No focal deficit present.   Lab Results:  Recent Labs    02/17/22 0518 02/18/22 0528  WBC 14.1* 12.9*  HGB 10.5* 9.6*  HCT 31.2* 30.4*  PLT 215 184    BMET Recent Labs    02/17/22 0518 02/18/22 0528  NA 139 142  K 3.6 3.6  CL 109 113*  CO2 23 22  GLUCOSE 162* 142*  BUN 57* 61*  CREATININE 2.49* 2.82*  CALCIUM 9.9 9.6    PT/INR No results for input(s): "LABPROT", "INR" in the last 72 hours. ABG Recent Labs    02/16/22 1010  PHART 7.45  HCO3 26.4    Studies/Results: No results found.  Anti-infectives: Anti-infectives (From admission, onward)    Start     Dose/Rate Route Frequency Ordered Stop   02/13/22 2200  cefTRIAXone (ROCEPHIN) 2 g in sodium chloride 0.9 % 100 mL IVPB        2 g 200 mL/hr over 30 Minutes Intravenous Every 24 hours 02/13/22 0039 02/17/22 0649   02/13/22 2200  azithromycin (ZITHROMAX) tablet 500 mg        500 mg Oral Daily at bedtime 02/13/22 0039 02/16/22 2206   02/12/22 2215  ceFEPIme (MAXIPIME) 2 g in sodium chloride 0.9 % 100 mL IVPB  Status:  Discontinued        2 g 200 mL/hr over 30 Minutes Intravenous  Once 02/12/22 2201  02/12/22 2201   02/12/22 2215  cefTRIAXone (ROCEPHIN) 2 g in sodium chloride 0.9 % 100 mL IVPB  Status:  Discontinued        2 g 200 mL/hr over 30 Minutes Intravenous Every 24 hours 02/12/22 2202 02/13/22 0040   02/12/22 2215  azithromycin (ZITHROMAX) 500 mg in sodium chloride 0.9 % 250 mL IVPB        500 mg 250 mL/hr over 60 Minutes Intravenous  Once 02/12/22 2204 02/13/22 0053       Assessment/Plan:   Pt with recurrent hematuria / retention from massive prostate, worsened by catheter trauma. Initially improved with recurrence in the setting of possible accidental dislodgement. CBI resumed 02/18/22 after manually irrigated for ~200cc old clot.   Titrate CBI to light pink. Bladder  US to assess residual clot burden.  As per prior notes, continue finasteride now and at DC. Current catheter to stay.      Francisco Leon 02/18/2022

## 2022-02-18 NOTE — Progress Notes (Signed)
PROGRESS NOTE  Francisco Leon  DOB: May 08, 1931  PCP: Collene Leyden, MD MWU:[id number]  DOA: 02/12/2022  LOS: 6 days  Hospital Day: 7  Brief narrative: Francisco Leon is a 88 y.o. male with PMH significant for DM2, HTN, HLD, vascular dementia, BPH 10/25, patient presented to the ED with complaint of fever, cough as well as hematuria for 4 days.  In the ED, patient was mildly hypoxic and tachycardic Chest x-ray showed possible left basilar pneumonia COVID/flu negative Labs showed leukocytosis, hypokalemia, normal lactate Urinalysis showed moderate 50 RBC/hpf without bacteria.   Blood culture sent from ED and patient was started on Rocephin and Zithromax  In the ED, patient was noted to be in urinary retention.  Smallbore catheter was placed.  CT A/P showed severe left hydroureteronephrosis, mild right hydroureteronephrosis, without evidence of obstructing calculus.  It also showed severe enlarged prostate, moderate urinary bladder distention.  Foley catheter that was placed earlier in the ED was noted to be misplaced and inflated within the prostate.. Urology consultation was obtained.  Coud catheter was inserted and patient was started on slow irrigation. Admitted to [facility]  Subjective: Patient was seen and examined this morning.   Somnolent this morning.  Wife was at bedside.  She states that after he got Seroquel yesterday afternoon, he took good rest.  He slept well last night as well.  He was awake this morning to take his breakfast.  He was trying to make some sense.  After the morning dose of Seroquel, he is somnolent again.  Foley catheter with dark red blood.  Assessment and plan: Sepsis POA due to pneumonia and UTI Met sepsis criteria with fever, cough, tachycardia, leukocytosis Blood cultures have been negative so far. WBC count improving with IV Rocephin and azithromycin. Recent Labs  Lab 02/12/22 2210 02/13/22 0407 02/14/22 0532 02/15/22 0536 02/16/22 0856  02/17/22 0518 02/18/22 0528  WBC 19.2*   < > 14.0* 15.3* 12.2* 14.1* 12.9*  LATICACIDVEN 1.4  --   --   --   --   --   --   PROCALCITON <0.10  --  0.71  --   --   --   --    < > = values in this interval not displayed.    Left basilar pneumonia Respiratory panel, COVID influenza A*B negative. Procalcitonin was elevated to 0.71. Completed 5-day course of Rocephin and azithromycin. Respiratory status normal, currently breathing on room air   UTI with hematuria Urine culture grew more than 1000 CFU per mL of strep agalactiae Completed 5-day course of antibiotics  Gross hematuria Severe left hydroureteronephrosis, severe prostatomegaly Per urology note, patient has long history of off-and-on hematuria from presumed BPH since 2021.  Follows up with Dr. Gilford Rile as an outpatient.   In the ED, coud catheter was placed and patient was started on slow irrigation.  Hematuria off and on.  Mild red-colored urine remaining neuro back.  Foley catheter with clear urine. Currently on finasteride 5 mg daily and Flomax 0.4 mg twice daily  AKI Creatinine elevated to 2.18 at presentation.  Likely from a combination of sepsis as well as urinary obstruction as detailed above. Creatinine initially improved but has been worsening again for last 48 hours.  Creatinine 2.82 this morning.  Currently remains on normal saline at 75 mill per hour.  Continue the same. Recent Labs    02/12/22 2210 02/13/22 0407 02/14/22 0532 02/15/22 0536 02/16/22 0856 02/17/22 0518 02/18/22 0528  BUN 19 17 31* 37* 42*  57* 61*  CREATININE 1.10 1.00 2.18* 1.84* 1.66* 2.49* 2.82*    Altered mental status Underlying mild dementia Patient mental status has been worsening for the last 48 hours it seems.  Per wife, he has not slept for last 2 nights.  He has required as needed IV Ativan. 10/29, CT head did not show any acute intracranial abnormality but showed moderate atrophy as well as chronic microvascular  ischemia. Currently patient is on Namenda, Lexapro, Seroquel 25 mg twice daily and melatonin 3 mg nightly. We'll hold morning dose of Seroquel and continue it at nighttime only. Continue supportive care for dementia  Type 2 diabetes mellitus A1c 6.5 on 10/25 PTA on glimepiride 2 mg daily, metformin 500 mg daily.  Currently both on hold. Currently on sliding scale insulin with Accu-Cheks.  Blood sugar level reasonably controlled Recent Labs  Lab 02/17/22 1117 02/17/22 1613 02/17/22 2100 02/18/22 0716 02/18/22 1140  GLUCAP 152* 150* 149* 144* 157*    Essential hypertension  PTA on losartan 50 mg daily, Aldactone 25 mg daily, HCTZ 25 mg daily.  Currently meds on hold because of AKI Continue to monitor blood pressure.  Continue hydralazine IV as needed.   Hyperlipidemia Continue Lipitor   Goals of care   Code Status: Full Code    Mobility: Encourage ambulation once mental status improved  Skin assessment:     Nutritional status:  Body mass index is 28.64 kg/m.          Diet:  Diet Order             Diet regular Room service appropriate? Yes; Fluid consistency: Thin  Diet effective now                   DVT prophylaxis:  SCDs Start: 02/13/22 0047   Antimicrobials: Completed the course of antibiotics Fluid: NS at 75 mill per hour to continue Consultants: None Family Communication: Wife at bedside  Status is: Inpatient  Continue in-hospital care because: Improving mental status, delirious, worsening kidney function Level of care: Telemetry   Dispo: The patient is from: Home              Anticipated d/c is to: Pending clinical course              Patient currently is not medically stable to d/c.   Difficult to place patient No     Infusions:   sodium chloride 125 mL/hr at 02/18/22 0945    Scheduled Meds:  atorvastatin  10 mg Oral QHS   Chlorhexidine Gluconate Cloth  6 each Topical Daily   escitalopram  10 mg Oral Daily   finasteride  5 mg  Oral Daily   insulin aspart  0-5 Units Subcutaneous QHS   insulin aspart  0-6 Units Subcutaneous TID WC   melatonin  3 mg Oral QHS   memantine  10 mg Oral BID   mouth rinse  15 mL Mouth Rinse 4 times per day   [START ON 02/19/2022] QUEtiapine  25 mg Oral QHS   sodium chloride  1 drop Both Eyes QHS   sodium chloride flush  3 mL Intravenous Q12H   tamsulosin  0.4 mg Oral BID    PRN meds: acetaminophen **OR** acetaminophen, hydrALAZINE, ondansetron **OR** ondansetron (ZOFRAN) IV, mouth rinse, senna-docusate   Antimicrobials: Anti-infectives (From admission, onward)    Start     Dose/Rate Route Frequency Ordered Stop   02/13/22 2200  cefTRIAXone (ROCEPHIN) 2 g in sodium chloride 0.9 % 100  mL IVPB        2 g 200 mL/hr over 30 Minutes Intravenous Every 24 hours 02/13/22 0039 02/17/22 0649   02/13/22 2200  azithromycin (ZITHROMAX) tablet 500 mg        500 mg Oral Daily at bedtime 02/13/22 0039 02/16/22 2206   02/12/22 2215  ceFEPIme (MAXIPIME) 2 g in sodium chloride 0.9 % 100 mL IVPB  Status:  Discontinued        2 g 200 mL/hr over 30 Minutes Intravenous  Once 02/12/22 2201 02/12/22 2201   02/12/22 2215  cefTRIAXone (ROCEPHIN) 2 g in sodium chloride 0.9 % 100 mL IVPB  Status:  Discontinued        2 g 200 mL/hr over 30 Minutes Intravenous Every 24 hours 02/12/22 2202 02/13/22 0040   02/12/22 2215  azithromycin (ZITHROMAX) 500 mg in sodium chloride 0.9 % 250 mL IVPB        500 mg 250 mL/hr over 60 Minutes Intravenous  Once 02/12/22 2204 02/13/22 0053       Objective: Vitals:   02/17/22 2106 02/18/22 0500  BP: (!) 138/57 135/60  Pulse: (!) 58 60  Resp: 19   Temp: 98.1 F (36.7 C) 98.2 F (36.8 C)  SpO2: 92% 95%    Intake/Output Summary (Last 24 hours) at 02/18/2022 1335 Last data filed at 02/18/2022 0824 Gross per 24 hour  Intake 150.05 ml  Output 750 ml  Net -599.95 ml    Filed Weights   02/15/22 0500 02/16/22 0500 02/17/22 0500  Weight: 83 kg 80.8 kg 80.5 kg    Weight change:  Body mass index is 28.64 kg/m.   Physical Exam: General exam: Pleasant, elderly Caucasian male.  Not in physical distress.  Delirious Skin: No rashes, lesions or ulcers. HEENT: Atraumatic, normocephalic, no obvious bleeding Lungs: Clear to auscultation bilaterally CVS: Regular rate and rhythm, no murmur GI/Abd soft, nontender, nondistended, bowel sound present CNS: Somnolent, opens eyes and verbal command  psychiatry: Sad affect Extremities: No pedal edema, no calf tenderness  Data Review: I have personally reviewed the laboratory data and studies available.  F/u labs ordered Unresulted Labs (From admission, onward)     Start     Ordered   02/13/22 0039  Expectorated Sputum Assessment w Gram Stain, Rflx to Resp Cult  (COPD / Pneumonia / Cellulitis / Lower Extremity Wound)  Once,   R        02/13/22 0039            Signed, Terrilee Croak, MD [facility] 02/18/2022

## 2022-02-19 DIAGNOSIS — A419 Sepsis, unspecified organism: Secondary | ICD-10-CM | POA: Diagnosis not present

## 2022-02-19 DIAGNOSIS — J189 Pneumonia, unspecified organism: Secondary | ICD-10-CM | POA: Diagnosis not present

## 2022-02-19 LAB — GLUCOSE, CAPILLARY
Glucose-Capillary: 139 mg/dL — ABNORMAL HIGH (ref 70–99)
Glucose-Capillary: 182 mg/dL — ABNORMAL HIGH (ref 70–99)
Glucose-Capillary: 149 mg/dL — ABNORMAL HIGH (ref 70–99)
Glucose-Capillary: 140 mg/dL — ABNORMAL HIGH (ref 70–99)

## 2022-02-19 LAB — TSH: TSH: 0.629 u[IU]/mL (ref 0.350–4.500)

## 2022-02-19 LAB — AMMONIA: Ammonia: <10 umol/L (ref 9–35)

## 2022-02-19 MED ORDER — IPRATROPIUM-ALBUTEROL 0.5-2.5 (3) MG/3ML IN SOLN: 3.0000 mL | RESPIRATORY_TRACT | Status: DC | PRN

## 2022-02-19 MED ORDER — SODIUM CHLORIDE 0.9 % IV SOLN: INTRAVENOUS | Status: AC

## 2022-02-19 MED ORDER — GUAIFENESIN 100 MG/5ML PO LIQD: 5.0000 mL | ORAL | Status: DC | PRN

## 2022-02-19 MED ORDER — TRAZODONE HCL 50 MG PO TABS
50.0000 mg | ORAL_TABLET | Freq: Every evening | ORAL | Status: DC | PRN
  Administered 2022-02-19 – 2022-02-23 (×4): 50 mg via ORAL
  Filled 2022-02-19 (×4): qty 1

## 2022-02-19 MED ORDER — SENNOSIDES-DOCUSATE SODIUM 8.6-50 MG PO TABS
1.0000 | ORAL_TABLET | Freq: Every evening | ORAL | Status: DC | PRN
  Administered 2022-02-22 – 2022-02-24 (×2): 1 via ORAL
  Filled 2022-02-19 (×2): qty 1

## 2022-02-19 MED ORDER — METOPROLOL TARTRATE 5 MG/5ML IV SOLN: 5.0000 mg | INTRAVENOUS | Status: DC | PRN

## 2022-02-19 MED ORDER — OLANZAPINE 10 MG IM SOLR
5.0000 mg | Freq: Four times a day (QID) | INTRAMUSCULAR | Status: AC | PRN
  Administered 2022-02-19 (×2): 5 mg via INTRAVENOUS
  Filled 2022-02-19 (×3): qty 10

## 2022-02-19 NOTE — Progress Notes (Signed)
PROGRESS NOTE    Francisco Leon  DVV:[id number] DOB: Nov 20, 1931 DOA: 02/12/2022 PCP: Collene Leyden, MD   Brief Narrative:  88 y.o. male with PMH significant for DM2, HTN, HLD, vascular dementia, BPH 10/25, patient presented to the ED with complaint of fever, cough as well as hematuria for 4 days.  In the ED chest x-ray showed left basilar pneumonia, COVID/flu was negative.  Empirically started on Rocephin and azithromycin.  There was concerns of acute urinary retention requiring coud atheter placement.  CT abdomen pelvis showed bilateral hydronephrosis with enlarged prostate, urology was consulted   Assessment & Plan:  Principal Problem:   Sepsis due to pneumonia [facility]) Active Problems:   Diabetes mellitus without complication ([place])   Hypertension   Pure hypercholesterolemia   Dementia ([place])   Hypokalemia   Hematuria    Sepsis POA secondary to left basilar pneumonia and urinary tract infection Sepsis parameters are slowly improving. Cultures-urine cultures have grown group B strep Antibiotics-completed 5 days of azithromycin and Rocephin.  Left basilar pneumonia Procalcitonin was 0.71, completed course of Rocephin and azithromycin.  Now doing well on room air.  As needed bronchodilators.     UTI with hematuria Urine culture grew more than 1000 CFU per mL of strep agalactiae Patient has completed 5 days of Rocephin   Gross hematuria Bilateral hydronephrosis with severe prostatomegaly , Urology team is following.  Coud catheter is currently in place.  Continue finasteride and Flomax.  Intermittent concerns of clotting therefore CBI   AKI -Baseline creatinine is 1.1, this is trended up and peaked at 2.8.  Currently coud catheter in place, getting IV fluid and CBI.  Suspect most of this is postobstructive   Altered mental status Underlying mild dementia From underlying infection and underlying dementia.  CT of the head did not show any acute pathology.  He is on Namenda,  Lexapro, Seroquel and melatonin.  Seroquel has been changed to nighttime  Type 2 diabetes mellitus A1c 6.5 on 10/25.  Home p.o. medications on hold.  Currently on sliding scale and Accu-Cheks    Essential hypertension  Losartan, lactone and HCTZ on hold due to AKI.  IV as needed ordered   Hyperlipidemia Continue Lipitor         DVT prophylaxis: SCDs Start: 02/13/22 0047 Code Status: Full code Family Communication: Spouse is at the bedside  Status is: Inpatient Still delusional and confused.  Urology is following.   Subjective:  Off-and-on showing signs of agitation.  Wife is at bedside.  Her main concern is that his ambulation status  Examination:  General exam: Appears calm and comfortable.  Elderly frail Respiratory system: Clear to auscultation. Respiratory effort normal. Cardiovascular system: S1 & S2 heard, RRR. No JVD, murmurs, rubs, gallops or clicks. No pedal edema. Gastrointestinal system: Abdomen is nondistended, soft and nontender. No organomegaly or masses felt. Normal bowel sounds heard. Central nervous system: Grossly moving all the extremities, no focal neurodeficits.  Overall confused Extremities: Grossly moving all the extremities Skin: No rashes, lesions or ulcers Psychiatry: Agitated with poor judgment and insight  Objective: Vitals:   02/18/22 0500 02/18/22 1358 02/18/22 2034 02/19/22 0503  BP: 135/60 (!) 124/56 (!) 130/55 135/62  Pulse: 60 (!) 50 (!) 53 79  Resp:  '16 18 20  '$ Temp: 98.2 F (36.8 C) 98 F (36.7 C) 97.8 F (36.6 C) (!) 97.3 F (36.3 C)  TempSrc: Oral Oral Oral Oral  SpO2: 95% 94% 97% 92%  Weight:    81.4 kg  Height:  Intake/Output Summary (Last 24 hours) at 02/19/2022 0916 Last data filed at 02/19/2022 0600 Gross per 24 hour  Intake 9874.9 ml  Output 9875 ml  Net -0.1 ml   Filed Weights   02/16/22 0500 02/17/22 0500 02/19/22 0503  Weight: 80.8 kg 80.5 kg 81.4 kg     Data Reviewed:   CBC: Recent Labs  Lab  02/12/22 2210 02/13/22 0407 02/14/22 0532 02/15/22 0536 02/16/22 0856 02/17/22 0518 02/18/22 0528  WBC 19.2*   < > 14.0* 15.3* 12.2* 14.1* 12.9*  NEUTROABS 16.6*  --   --   --   --   --  9.6*  HGB 11.1*   < > 10.8* 11.2* 10.1* 10.5* 9.6*  HCT 33.9*   < > 32.6* 34.1* 31.4* 31.2* 30.4*  MCV 85.8   < > 84.9 85.9 86.7 84.1 88.6  PLT 263   < > 244 265 233 215 184   < > = values in this interval not displayed.   Basic Metabolic Panel: Recent Labs  Lab 02/13/22 0407 02/14/22 0532 02/15/22 0536 02/16/22 0856 02/17/22 0518 02/18/22 0528  NA 134* 133* 140 142 139 142  K 2.8* 3.8 4.0 3.8 3.6 3.6  CL 108 105 109 111 109 113*  CO2 21* '22 24 25 23 22  '$ GLUCOSE 142* 169* 140* 157* 162* 142*  BUN 17 31* 37* 42* 57* 61*  CREATININE 1.00 2.18* 1.84* 1.66* 2.49* 2.82*  CALCIUM 8.3* 10.1 10.4* 9.9 9.9 9.6  MG 1.4* 2.5* 2.5*  --   --   --    GFR: Estimated Creatinine Clearance: 18.1 mL/min (A) (by C-G formula based on SCr of 2.82 mg/dL (H)). Liver Function Tests: Recent Labs  Lab 02/12/22 2210 02/13/22 0407 02/16/22 0856 02/17/22 0518  AST 16 14* 16 24  ALT '15 13 19 '$ 32  ALKPHOS 87 63 70 75  BILITOT 1.5* 1.1 0.8 0.9  PROT 7.1 5.2* 6.4* 6.8  ALBUMIN 3.5 2.5* 2.7* 2.9*   No results for input(s): "LIPASE", "AMYLASE" in the last 168 hours. Recent Labs  Lab 02/16/22 1020  AMMONIA 27   Coagulation Profile: Recent Labs  Lab 02/12/22 2210  INR 1.1   Cardiac Enzymes: No results for input(s): "CKTOTAL", "CKMB", "CKMBINDEX", "TROPONINI" in the last 168 hours. BNP (last 3 results) No results for input(s): "PROBNP" in the last 8760 hours. HbA1C: No results for input(s): "HGBA1C" in the last 72 hours. CBG: Recent Labs  Lab 02/18/22 0716 02/18/22 1140 02/18/22 1622 02/18/22 2158 02/19/22 0733  GLUCAP 144* 157* 139* 145* 139*   Lipid Profile: No results for input(s): "CHOL", "HDL", "LDLCALC", "TRIG", "CHOLHDL", "LDLDIRECT" in the last 72 hours. Thyroid Function Tests: No  results for input(s): "TSH", "T4TOTAL", "FREET4", "T3FREE", "THYROIDAB" in the last 72 hours. Anemia Panel: No results for input(s): "VITAMINB12", "FOLATE", "FERRITIN", "TIBC", "IRON", "RETICCTPCT" in the last 72 hours. Sepsis Labs: Recent Labs  Lab 02/12/22 2210 02/14/22 0532  PROCALCITON <0.10 0.71  LATICACIDVEN 1.4  --     Recent Results (from the past 240 hour(s))  Blood Culture (routine x 2)     Status: None   Collection Time: 02/12/22 10:10 PM   Specimen: BLOOD  Result Value Ref Range Status   Specimen Description   Final    BLOOD LEFT ANTECUBITAL Performed at [facility] [address] [place], [place] [address]    Special Requests   Final    BOTTLES DRAWN AEROBIC AND ANAEROBIC Blood Culture results may not be optimal due to  an excessive volume of blood received in culture bottles Performed at [facility] [address] [place], [place] [address]    Culture   Final    NO GROWTH 5 DAYS Performed at [facility], [place] [address], [place], [place] [address]    Report Status 02/18/2022 FINAL  Final  Blood Culture (routine x 2)     Status: None   Collection Time: 02/12/22 10:20 PM   Specimen: BLOOD  Result Value Ref Range Status   Specimen Description   Final    BLOOD RIGHT ANTECUBITAL Performed at [facility] [address] [place], [place] [address]    Special Requests   Final    BOTTLES DRAWN AEROBIC AND ANAEROBIC Blood Culture results may not be optimal due to an excessive volume of blood received in culture bottles Performed at [facility] [address], [place], [place] [address]    Culture   Final    NO GROWTH 5 DAYS Performed at [facility], [place] [address] [place], [place] [address]    Report Status 02/18/2022 FINAL  Final  Resp Panel by RT-PCR (Flu A&B, Covid) Anterior Nasal Swab     Status: None   Collection Time: 02/12/22 10:54 PM   Specimen: Anterior  Nasal Swab  Result Value Ref Range Status   SARS Coronavirus 2 by RT PCR NEGATIVE NEGATIVE Final    Comment: (NOTE) SARS-CoV-2 target nucleic acids are NOT DETECTED.  The SARS-CoV-2 RNA is generally detectable in upper respiratory specimens during the acute phase of infection. The lowest concentration of SARS-CoV-2 viral copies this assay can detect is 138 copies/mL. A negative result does not preclude SARS-Cov-2 infection and should not be used as the sole basis for treatment or other patient management decisions. A negative result may occur with  improper specimen collection/handling, submission of specimen other than nasopharyngeal swab, presence of viral mutation(s) within the areas targeted by this assay, and inadequate number of viral copies(<138 copies/mL). A negative result must be combined with clinical observations, patient history, and epidemiological information. The expected result is Negative.  Fact Sheet for Patients:  EntrepreneurPulse.com.au  Fact Sheet for Healthcare Providers:  IncredibleEmployment.be  This test is no t yet approved or cleared by the [place] FDA and  has been authorized for detection and/or diagnosis of SARS-CoV-2 by FDA under an Emergency Use Authorization (EUA). This EUA will remain  in effect (meaning this test can be used) for the duration of the COVID-19 declaration under Section 564(b)(1) of the Act, 21 U.S.C.section 360bbb-3(b)(1), unless the authorization is terminated  or revoked sooner.       Influenza A by PCR NEGATIVE NEGATIVE Final   Influenza B by PCR NEGATIVE NEGATIVE Final    Comment: (NOTE) The Xpert Xpress SARS-CoV-2/FLU/RSV plus assay is intended as an aid in the diagnosis of influenza from Nasopharyngeal swab specimens and should not be used as a sole basis for treatment. Nasal washings and aspirates are unacceptable for Xpert Xpress SARS-CoV-2/FLU/RSV testing.  Fact Sheet for  Patients: EntrepreneurPulse.com.au  Fact Sheet for Healthcare Providers: IncredibleEmployment.be  This test is not yet approved or cleared by the [place] FDA and has been authorized for detection and/or diagnosis of SARS-CoV-2 by FDA under an Emergency Use Authorization (EUA). This EUA will remain in effect (meaning this test can be used) for the duration of the COVID-19 declaration under Section 564(b)(1) of the Act, 21 U.S.C. section 360bbb-3(b)(1), unless the authorization is  terminated or revoked.  Performed at [facility], [place] [address] [place], [place] [address]   Urine Culture     Status: Abnormal   Collection Time: 02/12/22 11:53 PM   Specimen: Urine, Clean Catch  Result Value Ref Range Status   Specimen Description   Final    URINE, CLEAN CATCH Performed at [facility], [place] [address] [place], [place] [address]    Special Requests   Final    NONE Performed at [facility], [place] [address] [place], [place] [address]    Culture (A)  Final    >=100,000 COLONIES/mL GROUP B STREP(S.AGALACTIAE)ISOLATED TESTING AGAINST S. AGALACTIAE NOT ROUTINELY PERFORMED DUE TO PREDICTABILITY OF AMP/PEN/VAN SUSCEPTIBILITY. Performed at [facility], [place] [address], [place], [place] [address]    Report Status 02/14/2022 FINAL  Final         Radiology Studies: No results found.      Scheduled Meds:  atorvastatin  10 mg Oral QHS   Chlorhexidine Gluconate Cloth  6 each Topical Daily   escitalopram  10 mg Oral Daily   finasteride  5 mg Oral Daily   insulin aspart  0-5 Units Subcutaneous QHS   insulin aspart  0-6 Units Subcutaneous TID WC   melatonin  3 mg Oral QHS   memantine  10 mg Oral BID   mouth rinse  15 mL Mouth Rinse 4 times per day   QUEtiapine  25 mg Oral QHS   sodium chloride  1 drop Both Eyes QHS   sodium chloride flush  3 mL Intravenous Q12H   tamsulosin   0.4 mg Oral BID   Continuous Infusions:  sodium chloride 125 mL/hr at 02/19/22 0909     LOS: 7 days   Time spent= 35 mins    Krystl Wickware Arsenio Loader, MD [facility]  If 7PM-7AM, please contact night-coverage  02/19/2022, 9:16 AM

## 2022-02-19 NOTE — TOC Progression Note (Signed)
Transition of Care [facility]) - Progression Note    Patient Details  Name: Francisco Leon MRN: [id number] Date of Birth: Mar 18, 1932  Transition of Care [facility]) CM/SW [facility], LCSW Phone Number: 02/19/2022, 10:31 AM  Clinical Narrative:    Met with wife at bedside to discuss discharge plans. CSW revisited initial recommendation for [facility] and current recommendation for SNF due to pt's current status. Pt's spouse does not like the idea of pt going to SNF due to visiting friends in facilities and having concerns with their care. Pt's spouse is agreeable for referrals to be sent out for SNF with the understanding if pt is able to significantly improve while in the hospital  then home health can again be explored.   Referrals have been sent for SNF placement. Pt currently not medically ready for discharge. Pt will have to remain out of restraints including safety mittens for 24 hours prior to being able to discharge to SNF.    Expected Discharge Plan: [place] Barriers to Discharge: Continued Medical Work up  Expected Discharge Plan and Services Expected Discharge Plan: [place] In-house Referral: NA Discharge Planning Services: NA Post Acute Care Choice: [place] arrangements for the past 2 months: Single Family Home                 DME Arranged: N/A DME Agency: NA       HH Arranged: PT HH Agency: [place] Date [place]: 02/17/22 Time [place]: [id number] Representative spoke with at [place]: [place] ([place]) Interventions    Readmission Risk Interventions    02/17/2022   11:32 AM  Readmission Risk Prevention Plan  Transportation Screening Complete  PCP or Specialist Appt within 5-7 Days Complete  Home Care Screening Complete  Medication Review (RN CM) Complete

## 2022-02-19 NOTE — Progress Notes (Signed)
Physical Therapy Treatment Patient Details Name: Francisco Leon MRN: [id number] DOB: 12-16-1931 Today's Date: 02/19/2022   History of Present Illness Pt admitted from home with sepsis 2* PNA, urinary retention and AKI.  Pt with hx of DM, PVC, hearing deficits and significant vascular dementia    PT Comments    Patient remains limited by weakness and impaired mentation. At start of session pt more alert and able to follow questions/conversation with mostly appropriate responses. 2+ total assist required to sit up to EOB, once seated pt able to hold his balance with bil UE support and min guard for safety. Attempted stand but no initiation from pt and Max/Total +2 would be required. As pt fatigued he became more confused repeating single words and no answering questions appropriately. Pt returned to supine and bed placed in chair position to encourage pt to interact with spouse at bedside. Will continue to progress as able, recommend SNF rehab.    Recommendations for follow up therapy are one component of a multi-disciplinary discharge planning process, led by the attending physician.  Recommendations may be updated based on patient status, additional functional criteria and insurance authorization.  Follow Up Recommendations  Skilled nursing-short term rehab (<3 hours/day) (pt's wife reports she knows he is not mobilizing well enough to go home at current level) Can patient physically be transported by private vehicle: No   Assistance Recommended at Discharge Frequent or constant Supervision/Assistance  Patient can return home with the following Two people to help with walking and/or transfers;Assistance with cooking/housework;Assist for transportation;Help with stairs or ramp for entrance;Two people to help with bathing/dressing/bathroom;Assistance with feeding;Direct supervision/assist for medications management   Equipment Recommendations  None recommended by PT    Recommendations for  Other Services       Precautions / Restrictions Precautions Precautions: Fall Restrictions Weight Bearing Restrictions: No     Mobility  Bed Mobility Overal bed mobility: Needs Assistance Bed Mobility: Sit to Supine, Supine to Sit     Supine to sit: Total assist, +2 for safety/equipment, +2 for physical assistance, HOB elevated Sit to supine: +2 for physical assistance, Total assist, +2 for safety/equipment   General bed mobility comments: extra time and multimodal cues given, pt not initiating any movement to EOB. Total +2 to pivot/turn with bed pad    Transfers                   General transfer comment: single attempt at standing with RW, +2 Max/Total to block feet and maintain safe position on floor. pt did not initiate any power up and would require lift to transfer OOB safely.    Ambulation/Gait                   Stairs             Wheelchair Mobility    Modified Rankin (Stroke Patients Only)       Balance Overall balance assessment: Needs assistance Sitting-balance support: Feet supported, Bilateral upper extremity supported Sitting balance-Leahy Scale: Fair Sitting balance - Comments: Mod-Max at start of sitting EOB. once pt's feet on floor and bil UE on side of bed for support pt able to maintain upright seated balance with min guard. as pt fatigued he became more confused but still able to sit upright.  Cognition Arousal/Alertness: Lethargic Behavior During Therapy: Flat affect Overall Cognitive Status: History of cognitive impairments - at baseline                                 General Comments: Spouse present and reports improvement in level of alertness vs yesterday but not back to baseline; as pt fatigues with activity cognition/mentation declines        Exercises      General Comments        Pertinent Vitals/Pain Pain Assessment Breathing:  normal Negative Vocalization: none Facial Expression: smiling or inexpressive Body Language: tense, distressed pacing, fidgeting Consolability: no need to console PAINAD Score: 1 Pain Location: advanced dementia, unable to report, more generalized    Home Living                          Prior Function            PT Goals (current goals can now be found in the care plan section) Acute Rehab PT Goals Patient Stated Goal: Wife states goal is to return home as independent as possible PT Goal Formulation: With patient Time For Goal Achievement: 02/28/22 Potential to Achieve Goals: Fair Progress towards PT goals: Progressing toward goals (slow)    Frequency    Min 3X/week      PT Plan Current plan remains appropriate    Co-evaluation              AM-PAC PT "6 Clicks" Mobility   Outcome Measure  Help needed turning from your back to your side while in a flat bed without using bedrails?: Total Help needed moving from lying on your back to sitting on the side of a flat bed without using bedrails?: Total Help needed moving to and from a bed to a chair (including a wheelchair)?: Total Help needed standing up from a chair using your arms (e.g., wheelchair or bedside chair)?: Total Help needed to walk in hospital room?: Total Help needed climbing 3-5 steps with a railing? : Total 6 Click Score: 6    End of Session Equipment Utilized During Treatment: Gait belt Activity Tolerance: Patient limited by fatigue Patient left: in bed;with call bell/phone within reach;with bed alarm set;with family/visitor present (chair alarm) Nurse Communication: Mobility status PT Visit Diagnosis: Difficulty in walking, not elsewhere classified (R26.2)     Time: 4562-5638 PT Time Calculation (min) (ACUTE ONLY): 24 min  Charges:  $Therapeutic Activity: 23-37 mins                     Verner Mould, DPT Acute Rehabilitation Services Office [phone]  02/19/22 10:04 AM

## 2022-02-19 NOTE — NC FL2 (Signed)
[place] LEVEL OF CARE SCREENING TOOL     IDENTIFICATION  Patient Name: Francisco Leon Birthdate: April 04, 1932 Sex: male Admission Date (Current Location): 02/12/2022  [facility] [place] and [place] Number:  Herbalist and Address:  [facility],  [place], [place]      Provider Number: [id number]  Attending Physician Name and Address:  Damita Lack, MD  Relative Name and Phone Number:  Moses Manners [phone]    Current Level of Care: Hospital Recommended Level of Care: [place] Prior Approval Number:    Date Approved/Denied:   PASRR Number: [id number]  Discharge Plan: SNF    Current Diagnoses: Patient Active Problem List   Diagnosis Date Noted   Diabetes mellitus without complication ([place]) 89/37/3428   Hypertension 02/13/2022   Pure hypercholesterolemia 02/13/2022   Dementia ([place]) 02/13/2022   Hypokalemia 02/13/2022   Hematuria 02/13/2022   Sepsis due to pneumonia ([place]) 02/12/2022    Orientation RESPIRATION BLADDER Height & Weight     Self  Normal Incontinent, Indwelling catheter Weight: 179 lb 7.3 oz (81.4 kg) Height:  '5\' 6"'$  (167.6 cm)  BEHAVIORAL SYMPTOMS/MOOD NEUROLOGICAL BOWEL NUTRITION STATUS      Continent Diet (Regular)  AMBULATORY STATUS COMMUNICATION OF NEEDS Skin   Total Care Verbally Normal                       Personal Care Assistance Level of Assistance  Bathing, Feeding, Dressing Bathing Assistance: Maximum assistance Feeding assistance: Maximum assistance Dressing Assistance: Maximum assistance     Functional Limitations Info  Sight, Hearing, Speech Sight Info: Adequate Hearing Info: Impaired Speech Info: Impaired    SPECIAL CARE FACTORS FREQUENCY  PT (By licensed PT), OT (By licensed OT)     PT Frequency: 5x/wk OT Frequency: 5x/wk            Contractures Contractures Info: Not present    Additional Factors Info  Code Status, Allergies,  Psychotropic Code Status Info: Full Allergies Info: Dutasteride, Lisinopril, Sulfa Antibiotics Psychotropic Info: See MAR         Current Medications (02/19/2022):  This is the current hospital active medication list Current Facility-Administered Medications  Medication Dose Route Frequency Provider Last Rate Last Admin   0.9 %  sodium chloride infusion   Intravenous Continuous Damita Lack, MD 75 mL/hr at 02/19/22 0931 New Bag at 02/19/22 0931   acetaminophen (TYLENOL) tablet 650 mg  650 mg Oral Q6H PRN Opyd, Ilene Qua, MD       Or   acetaminophen (TYLENOL) suppository 650 mg  650 mg Rectal Q6H PRN Opyd, Ilene Qua, MD   650 mg at 02/13/22 0220   atorvastatin (LIPITOR) tablet 10 mg  10 mg Oral QHS Opyd, Ilene Qua, MD   10 mg at 02/18/22 2123   Chlorhexidine Gluconate Cloth 2 % PADS 6 each  6 each Topical Daily Dibia, Manfred Shirts, MD   6 each at 02/19/22 0931   escitalopram (LEXAPRO) tablet 10 mg  10 mg Oral Daily Pokhrel, Laxman, MD   10 mg at 02/19/22 0854   finasteride (PROSCAR) tablet 5 mg  5 mg Oral Daily Alexis Frock, MD   5 mg at 02/19/22 0853   guaiFENesin (ROBITUSSIN) 100 MG/5ML liquid 5 mL  5 mL Oral Q4H PRN Amin, Ankit Chirag, MD       hydrALAZINE (APRESOLINE) injection 10 mg  10 mg Intravenous Q6H PRN Flora Lipps, MD  insulin aspart (novoLOG) injection 0-5 Units  0-5 Units Subcutaneous QHS Opyd, Ilene Qua, MD       insulin aspart (novoLOG) injection 0-6 Units  0-6 Units Subcutaneous TID WC Opyd, Ilene Qua, MD   1 Units at 02/18/22 1200   ipratropium-albuterol (DUONEB) 0.5-2.5 (3) MG/3ML nebulizer solution 3 mL  3 mL Nebulization Q4H PRN Amin, Ankit Chirag, MD       melatonin tablet 3 mg  3 mg Oral QHS Dahal, Binaya, MD   3 mg at 02/18/22 2123   memantine (NAMENDA) tablet 10 mg  10 mg Oral BID Opyd, Ilene Qua, MD   10 mg at 02/19/22 0854   metoprolol tartrate (LOPRESSOR) injection 5 mg  5 mg Intravenous Q4H PRN Amin, Ankit Chirag, MD       ondansetron (ZOFRAN)  tablet 4 mg  4 mg Oral Q6H PRN Opyd, Ilene Qua, MD       Or   ondansetron (ZOFRAN) injection 4 mg  4 mg Intravenous Q6H PRN Opyd, Ilene Qua, MD   4 mg at 02/13/22 7342   Oral care mouth rinse  15 mL Mouth Rinse 4 times per day Vianne Bulls, MD   15 mL at 02/19/22 0931   Oral care mouth rinse  15 mL Mouth Rinse PRN Opyd, Ilene Qua, MD       QUEtiapine (SEROQUEL) tablet 25 mg  25 mg Oral QHS Dahal, Binaya, MD       senna-docusate (Senokot-S) tablet 1 tablet  1 tablet Oral QHS PRN Amin, Ankit Chirag, MD       sodium chloride (MURO 128) 2 % ophthalmic solution 1 drop  1 drop Both Eyes QHS Pokhrel, Laxman, MD   1 drop at 02/18/22 2124   sodium chloride flush (NS) 0.9 % injection 3 mL  3 mL Intravenous Q12H Opyd, Ilene Qua, MD   3 mL at 02/19/22 0932   tamsulosin (FLOMAX) capsule 0.4 mg  0.4 mg Oral BID Terrilee Croak, MD   0.4 mg at 02/19/22 0853   traZODone (DESYREL) tablet 50 mg  50 mg Oral QHS PRN Damita Lack, MD         Discharge Medications: Please see discharge summary for a list of discharge medications.  Relevant Imaging Results:  Relevant Lab Results:   Additional Information SSN: [id number]  Vassie Moselle, LCSW

## 2022-02-19 NOTE — Progress Notes (Signed)
Subjective/Chief Complaint:  1 - Gross Hematuria - long h/o on off hematuria on / off from presemed BPH since 2021. CT 2021 and cysto w/o neoplasia. UCX 10/25 staph (?contaminant), now s/p course of rocephin   2 - Urinary Retention / Acute Renal Failure - on tamsulosin at baseline BID with Dr. Lovena Neighbours. Baseline Cr <1.2 with rise to 2s during hospitalization for pneumonia and noted to be in retention. Small bore catheter placed. CT 10/27 with malpositioned catheter, balloon in massive 200gm prostate. Restarted on finasteride and large bore 3 way catheter placed to traction and slow irrigation. Irrigation stopped 10/28. Hematuria recurred 02/18/22, unclear circumstances, clot retention. Patency restored following manual irrigation and CBI resumed.   Catheter clear yellow this morning on rounds, CBI at moderate rate. Decreased to very slow drip.  Objective: Vital signs in last 24 hours: Temp:  [97.3 F (36.3 C)-98 F (36.7 C)] 97.3 F (36.3 C) (11/01 0503) Pulse Rate:  [50-79] 79 (11/01 0503) Resp:  [16-20] 20 (11/01 0503) BP: (124-135)/(55-62) 135/62 (11/01 0503) SpO2:  [92 %-97 %] 92 % (11/01 0503) Weight:  [81.4 kg] 81.4 kg (11/01 0503) Last BM Date : 02/18/22  Intake/Output from previous day: 10/31 0701 - 11/01 0700 In: 9874.9 [P.O.:120; I.V.:3754.9] Out: 09628 [Urine:10625] Intake/Output this shift: No intake/output data recorded.  Physical Exam Vitals reviewed.  Constitutional:      Comments: AOx0 in hospital bed. Non-combative. Grandson at bedside HENT:     Head: Normocephalic.  Eyes:     Pupils: Pupils are equal, round, and reactive to light.  Cardiovascular:     Rate and Rhythm: Normal rate.  Pulmonary:     Effort: Pulmonary effort is normal.  Abdominal:     General: Abdomen is flat.  Genitourinary:    Comments: 22Fr 3-way in place with CBI on moderate gtt, urine light pink Musculoskeletal:        General: Normal range of motion.   Neurological:     General:  No focal deficit present.   Lab Results:  Recent Labs    02/17/22 0518 02/18/22 0528  WBC 14.1* 12.9*  HGB 10.5* 9.6*  HCT 31.2* 30.4*  PLT 215 184    BMET Recent Labs    02/17/22 0518 02/18/22 0528  NA 139 142  K 3.6 3.6  CL 109 113*  CO2 23 22  GLUCOSE 162* 142*  BUN 57* 61*  CREATININE 2.49* 2.82*  CALCIUM 9.9 9.6    PT/INR No results for input(s): "LABPROT", "INR" in the last 72 hours. ABG No results for input(s): "PHART", "HCO3" in the last 72 hours.  Invalid input(s): "PCO2", "PO2"   Studies/Results: No results found.  Anti-infectives: Anti-infectives (From admission, onward)    Start     Dose/Rate Route Frequency Ordered Stop   02/13/22 2200  cefTRIAXone (ROCEPHIN) 2 g in sodium chloride 0.9 % 100 mL IVPB        2 g 200 mL/hr over 30 Minutes Intravenous Every 24 hours 02/13/22 0039 02/17/22 0649   02/13/22 2200  azithromycin (ZITHROMAX) tablet 500 mg        500 mg Oral Daily at bedtime 02/13/22 0039 02/16/22 2206   02/12/22 2215  ceFEPIme (MAXIPIME) 2 g in sodium chloride 0.9 % 100 mL IVPB  Status:  Discontinued        2 g 200 mL/hr over 30 Minutes Intravenous  Once 02/12/22 2201 02/12/22 2201   02/12/22 2215  cefTRIAXone (ROCEPHIN) 2 g in sodium chloride 0.9 % 100 mL  IVPB  Status:  Discontinued        2 g 200 mL/hr over 30 Minutes Intravenous Every 24 hours 02/12/22 2202 02/13/22 0040   02/12/22 2215  azithromycin (ZITHROMAX) 500 mg in sodium chloride 0.9 % 250 mL IVPB        500 mg 250 mL/hr over 60 Minutes Intravenous  Once 02/12/22 2204 02/13/22 0053       Assessment/Plan:   Pt with recurrent hematuria / retention from massive prostate, worsened by catheter trauma. Initially improved with recurrence in the setting of possible accidental dislodgement. CBI resumed 02/18/22 after manually irrigated for ~200cc old clot.   Titrate CBI to light pink. Bladder US to assess residual clot burden.  As per prior notes, continue finasteride now and  at DC. Current catheter to stay.      Francisco Leon 02/19/2022

## 2022-02-19 NOTE — Plan of Care (Signed)

## 2022-02-20 DIAGNOSIS — A419 Sepsis, unspecified organism: Secondary | ICD-10-CM | POA: Diagnosis not present

## 2022-02-20 DIAGNOSIS — J189 Pneumonia, unspecified organism: Secondary | ICD-10-CM | POA: Diagnosis not present

## 2022-02-20 LAB — CBC
HCT: 28.6 % — ABNORMAL LOW (ref 39.0–52.0)
Hemoglobin: 9.4 g/dL — ABNORMAL LOW (ref 13.0–17.0)
MCH: 28.4 pg (ref 26.0–34.0)
MCHC: 32.9 g/dL (ref 30.0–36.0)
MCV: 86.4 fL (ref 80.0–100.0)
Platelets: 161 10*3/uL (ref 150–400)
RBC: 3.31 MIL/uL — ABNORMAL LOW (ref 4.22–5.81)
RDW: 13.5 % (ref 11.5–15.5)
WBC: 11.6 10*3/uL — ABNORMAL HIGH (ref 4.0–10.5)
nRBC: 0 % (ref 0.0–0.2)

## 2022-02-20 LAB — BASIC METABOLIC PANEL
Anion gap: 6 (ref 5–15)
BUN: 23 mg/dL (ref 8–23)
CO2: 19 mmol/L — ABNORMAL LOW (ref 22–32)
Calcium: 9.4 mg/dL (ref 8.9–10.3)
Chloride: 121 mmol/L — ABNORMAL HIGH (ref 98–111)
Creatinine, Ser: 0.86 mg/dL (ref 0.61–1.24)
GFR, Estimated: >60 mL/min (ref >60)
Glucose, Bld: 172 mg/dL — ABNORMAL HIGH (ref 70–99)
Potassium: 3.3 mmol/L — ABNORMAL LOW (ref 3.5–5.1)
Sodium: 146 mmol/L — ABNORMAL HIGH (ref 135–145)

## 2022-02-20 LAB — MAGNESIUM: Magnesium: 1.8 mg/dL (ref 1.7–2.4)

## 2022-02-20 LAB — GLUCOSE, CAPILLARY
Glucose-Capillary: 199 mg/dL — ABNORMAL HIGH (ref 70–99)
Glucose-Capillary: 172 mg/dL — ABNORMAL HIGH (ref 70–99)
Glucose-Capillary: 176 mg/dL — ABNORMAL HIGH (ref 70–99)

## 2022-02-20 MED ORDER — SODIUM CHLORIDE 0.9 % IV SOLN: INTRAVENOUS | Status: AC

## 2022-02-20 MED ORDER — POTASSIUM CHLORIDE CRYS ER 20 MEQ PO TBCR
40.0000 meq | EXTENDED_RELEASE_TABLET | Freq: Once | ORAL | Status: AC
  Administered 2022-02-20: 40 meq via ORAL
  Filled 2022-02-20: qty 2

## 2022-02-20 NOTE — TOC Progression Note (Signed)
Transition of Care [facility]) - Progression Note    Patient Details  Name: Francisco Leon MRN: [id number] Date of Birth: 07-12-1931  Transition of Care [facility]) CM/SW [place], LCSW Phone Number: 02/20/2022, 2:03 PM  Clinical Narrative:    Met with pt's wife to review beds offers for SNF. Pt's wife is to "think about" placement. CSW left list of facilities with wife. Pt's wife is still hopeful that pt will be able to walk and return home. CSW will touch base with wife in AM to discuss bed choice for SNF.    Expected Discharge Plan: [place] Barriers to Discharge: Continued Medical Work up  Expected Discharge Plan and Services Expected Discharge Plan: [place] In-house Referral: NA Discharge Planning Services: NA Post Acute Care Choice: [place] arrangements for the past 2 months: Single Family Home                 DME Arranged: N/A DME Agency: NA       HH Arranged: PT HH Agency: [place] Date [place]: 02/17/22 Time [place]: 1133 Representative spoke with at [place]: [place] ([place]) Interventions    Readmission Risk Interventions    02/17/2022   11:32 AM  Readmission Risk Prevention Plan  Transportation Screening Complete  PCP or Specialist Appt within 5-7 Days Complete  Home Care Screening Complete  Medication Review (RN CM) Complete

## 2022-02-20 NOTE — Progress Notes (Addendum)
Physical Therapy Treatment Patient Details Name: Francisco Leon MRN: [id number] DOB: 03/15/1932 Today's Date: 02/20/2022   History of Present Illness Pt admitted from home with sepsis 2* PNA, urinary retention and AKI.  Pt with hx of DM, PVC, hearing deficits and significant vascular dementia    PT Comments    Wife requested physical therapy to see patient. Patient required total assist +2 for bed mobility, max assist +2 for transfers, and max assist +3 (side to side assist and recliner follow) for ambulation. Pt was able to ambulate 5 ft but unable to go further due to cognition impairment. Presented with poor weightshift, R foot drag, trunk and knees flexed, short shuffling steps, and poor posture. Pt's wife instructed patient to limit agitation from patient. Physical Therapy recommendations are for SNF, wife is hopeful to return back home.  Recommendations for follow up therapy are one component of a multi-disciplinary discharge planning process, led by the attending physician.  Recommendations may be updated based on patient status, additional functional criteria and insurance authorization.  Follow Up Recommendations  Skilled nursing-short term rehab (<3 hours/day) Can patient physically be transported by private vehicle: No   Assistance Recommended at Discharge Frequent or constant Supervision/Assistance  Patient can return home with the following Two people to help with walking and/or transfers;Assistance with cooking/housework;Assist for transportation;Help with stairs or ramp for entrance;Two people to help with bathing/dressing/bathroom;Assistance with feeding;Direct supervision/assist for medications management   Equipment Recommendations  None recommended by PT    Recommendations for Other Services       Precautions / Restrictions Precautions Precautions: Fall Restrictions Weight Bearing Restrictions: No     Mobility  Bed Mobility Overal bed mobility: Needs  Assistance Bed Mobility: Supine to Sit     Supine to sit: Total assist, +2 for safety/equipment, +2 for physical assistance, HOB elevated     General bed mobility comments: extra time and multimodal cues given, pt not initiating any movement to EOB. Total +2 to get to EOB, scooted towards EOB w/ bed pad.    Transfers Overall transfer level: Needs assistance Equipment used: Rolling walker (2 wheels) Transfers: Sit to/from Stand Sit to Stand: +2 physical assistance, From elevated surface, Max assist           General transfer comment: Max assist +2 to maintain pt standing. Pt did not initiate any power up, tactile cues needed for safe hand placement.    Ambulation/Gait Ambulation/Gait assistance: +2 physical assistance, Max assist Gait Distance (Feet): 5 Feet Assistive device: Rolling walker (2 wheels) Gait Pattern/deviations: Step-to pattern, Decreased step length - right, Decreased step length - left, Decreased stride length, Shuffle, Trunk flexed Gait velocity: decr     General Gait Details: unable to ambulate further due to impaired cognition. Poor weightshift, R foot drag, trunk and knees flexed, short shuffling steps, and poor posture.   Stairs             Wheelchair Mobility    Modified Rankin (Stroke Patients Only)       Balance                                            Cognition   Behavior During Therapy: Flat affect Overall Cognitive Status: History of cognitive impairments - at baseline  General Comments: Spouse present and reports improvement in level of alertness vs yesterday but not back to baseline; as pt fatigues with activity cognition/mentation declines        Exercises      General Comments        Pertinent Vitals/Pain Pain Assessment Pain Assessment: Faces Faces Pain Scale: No hurt Breathing: occasional labored breathing, short period of  hyperventilation Negative Vocalization: repeated troubled calling out, loud moaning/groaning, crying Facial Expression: smiling or inexpressive Body Language: tense, distressed pacing, fidgeting Consolability: no need to console PAINAD Score: 4 Pain Location: advanced dementia, unable to report, more generalized    Home Living                          Prior Function            PT Goals (current goals can now be found in the care plan section) Acute Rehab PT Goals Patient Stated Goal: Wife states goal is to return home as independent as possible PT Goal Formulation: With patient Time For Goal Achievement: 02/28/22 Potential to Achieve Goals: Fair Progress towards PT goals: Progressing toward goals    Frequency    Min 3X/week      PT Plan Current plan remains appropriate    Co-evaluation              AM-PAC PT "6 Clicks" Mobility   Outcome Measure  Help needed turning from your back to your side while in a flat bed without using bedrails?: Total Help needed moving from lying on your back to sitting on the side of a flat bed without using bedrails?: Total Help needed moving to and from a bed to a chair (including a wheelchair)?: Total Help needed standing up from a chair using your arms (e.g., wheelchair or bedside chair)?: A Lot Help needed to walk in hospital room?: A Lot Help needed climbing 3-5 steps with a railing? : Total 6 Click Score: 8    End of Session Equipment Utilized During Treatment: Gait belt Activity Tolerance: Patient limited by lethargy Patient left: with call bell/phone within reach;with family/visitor present;in chair;with chair alarm set Nurse Communication: Mobility status PT Visit Diagnosis: Difficulty in walking, not elsewhere classified (R26.2)     Time: 1510-1535 PT Time Calculation (min) (ACUTE ONLY): 25 min  Charges:  $Gait Training: 8-22 mins $Therapeutic Activity: 8-22 mins                        Allyson Sabal 02/20/2022, 3:53 PM

## 2022-02-20 NOTE — Progress Notes (Signed)
PROGRESS NOTE  Francisco Leon JSH:[id number] DOB: 10/13/1931 DOA: 02/12/2022 PCP: Collene Leyden, MD   LOS: 8 days   Brief Narrative / Interim history: 88 y.o. male with PMH significant for DM2, HTN, HLD, vascular dementia, BPH, admitted on 10/25 with complaint of fever, cough as well as hematuria for 4 days.  In the ED chest x-ray showed left basilar pneumonia, COVID/flu was negative.  Empirically started on Rocephin and azithromycin.  There was concerns of acute urinary retention requiring coud atheter placement.  CT abdomen pelvis showed bilateral hydronephrosis with enlarged prostate, urology was consulted  Subjective / 24h Interval events: Quite confused this morning.  Has mittens on and seems restless  Assesement and Plan: Principal Problem:   Sepsis due to pneumonia [facility]) Active Problems:   Diabetes mellitus without complication ([place])   Hypertension   Pure hypercholesterolemia   Dementia ([place])   Hypokalemia   Hematuria   Principal problem Sepsis POA secondary to left basilar pneumonia and urinary tract infection -Sepsis parameters are slowly improving.  Urine cultures showed  10^5 group B strep.  Status post completed treatment with ceftriaxone  Active problems  Left basilar pneumonia -Procalcitonin was 0.71, completed course of Rocephin and azithromycin.  Now doing well on room air.  As needed bronchodilators.   UTI with hematuria- completed Rocephin   Gross hematuria, bilateral hydronephrosis with severe prostatomegaly-Urology team is following.  Coud catheter is currently in place.  Continue finasteride and Flomax.  Intermittent concerns of clotting therefore CBI   AKI -Baseline creatinine is 1.1, this is trended up and peaked at 2.8.  Currently coud catheter in place, getting IV fluid and CBI.  Suspect most of this is postobstructive.  Creatinine has now normalized at 0.86 this morning  Hypernatremia-closely monitor, has significant urine output postobstructive.   Increase rate of fluids, continue for 24 more hours   Altered mental status, underlying mild dementia -From underlying infection and underlying dementia.  CT of the head did not show any acute pathology.  He is on Namenda, Lexapro, Seroquel and melatonin.  Seroquel has been changed to nighttime   Type 2 diabetes mellitus -A1c 6.5 on 10/25.  Home p.o. medications on hold. Currently on sliding scale and Accu-Cheks  CBG (last 3)  Recent Labs    02/19/22 1201 02/19/22 1632 02/19/22 2206  GLUCAP 182* 149* 140*     Essential hypertension  -His losartan, spironolactone, HCTZ are on hold due to AKI.  Start amlodipine today as he is hypertensive   Hyperlipidemia -Continue Lipitor   Scheduled Meds:  atorvastatin  10 mg Oral QHS   Chlorhexidine Gluconate Cloth  6 each Topical Daily   escitalopram  10 mg Oral Daily   finasteride  5 mg Oral Daily   insulin aspart  0-5 Units Subcutaneous QHS   insulin aspart  0-6 Units Subcutaneous TID WC   melatonin  3 mg Oral QHS   memantine  10 mg Oral BID   mouth rinse  15 mL Mouth Rinse 4 times per day   QUEtiapine  25 mg Oral QHS   sodium chloride  1 drop Both Eyes QHS   sodium chloride flush  3 mL Intravenous Q12H   tamsulosin  0.4 mg Oral BID   Continuous Infusions: PRN Meds:.acetaminophen **OR** acetaminophen, guaiFENesin, hydrALAZINE, ipratropium-albuterol, metoprolol tartrate, ondansetron **OR** ondansetron (ZOFRAN) IV, mouth rinse, senna-docusate, traZODone  Current Outpatient Medications  Medication Instructions   atorvastatin (LIPITOR) 10 mg, Oral, Daily at bedtime   carboxymethylcellulose (REFRESH PLUS) 0.5 % SOLN  1 drop, 3 times daily PRN   escitalopram (LEXAPRO) 10 mg, Oral, Daily   finasteride (PROSCAR) 5 mg, Oral, Daily, For massive prostate and urinary bleeding.   FREESTYLE LITE test strip 1 each, Daily   glimepiride (AMARYL) 2 mg, Oral, Daily with breakfast   glucose blood (FREESTYLE LITE) test strip USE 1 STRIP TO TEST BLOOD SUGAR  DAILY   Guaifenesin (MUCINEX MAXIMUM STRENGTH) 1,200 mg, Oral, Daily   Lancets (FREESTYLE) lancets Daily   levocetirizine (XYZAL) 5 MG tablet 1 tablet, Oral, Every evening   losartan (COZAAR) 50 mg, Oral, Daily at bedtime   memantine (NAMENDA TITRATION PACK) tablet pack As directed on pack   metFORMIN (GLUCOPHAGE) 500 mg, Oral, Daily with supper   polyethylene glycol (MIRALAX / GLYCOLAX) 17 g, Oral, Daily   sodium chloride (MURO 128) 2 % ophthalmic solution 1 drop, Both Eyes, Daily at bedtime   spironolactone-hydrochlorothiazide (ALDACTAZIDE) 25-25 MG tablet 1 tablet, Oral, Every morning   tamsulosin (FLOMAX) 0.4 mg, 2 times daily    Diet Orders (From admission, onward)     Start     Ordered   02/13/22 0037  Diet regular Room service appropriate? Yes; Fluid consistency: Thin  Diet effective now       Question Answer Comment  Room service appropriate? Yes   Fluid consistency: Thin      02/13/22 0039            DVT prophylaxis: SCDs Start: 02/13/22 0047   Lab Results  Component Value Date   PLT 161 02/20/2022      Code Status: Full Code  Family Communication: no family at bedside  Status is: Inpatient, CBI, confusion  Level of care: Telemetry  Consultants:  Urology   Objective: Vitals:   02/19/22 2116 02/20/22 0346 02/20/22 0347 02/20/22 0500  BP: (!) 144/62 (!) 170/90    Pulse: 79 (!) 40 81   Resp: 20 20    Temp: 98.1 F (36.7 C) 97.6 F (36.4 C)    TempSrc: Oral     SpO2: 93% 92% 95%   Weight:    81.4 kg  Height:        Intake/Output Summary (Last 24 hours) at 02/20/2022 1101 Last data filed at 02/20/2022 0900 Gross per 24 hour  Intake 2346.22 ml  Output 5700 ml  Net -3353.78 ml   Wt Readings from Last 3 Encounters:  02/20/22 81.4 kg  01/09/22 83 kg  08/26/21 83.3 kg    Examination:  Constitutional: NAD Eyes: no scleral icterus ENMT: Mucous membranes are moist.  Neck: normal, supple Respiratory: clear to auscultation bilaterally, no  wheezing, no crackles. Normal respiratory effort. No accessory muscle use.  Cardiovascular: Regular rate and rhythm, no murmurs / rubs / gallops.  Abdomen: non distended, no tenderness. Bowel sounds positive.  Musculoskeletal: no clubbing / cyanosis.  Skin: no rashes  Data Reviewed: I have independently reviewed following labs and imaging studies   CBC Recent Labs  Lab 02/15/22 0536 02/16/22 0856 02/17/22 0518 02/18/22 0528 02/20/22 0657  WBC 15.3* 12.2* 14.1* 12.9* 11.6*  HGB 11.2* 10.1* 10.5* 9.6* 9.4*  HCT 34.1* 31.4* 31.2* 30.4* 28.6*  PLT 265 233 215 184 161  MCV 85.9 86.7 84.1 88.6 86.4  MCH 28.2 27.9 28.3 28.0 28.4  MCHC 32.8 32.2 33.7 31.6 32.9  RDW 13.4 13.5 13.3 13.6 13.5  LYMPHSABS  --   --   --  1.3  --   MONOABS  --   --   --  1.5*  --   EOSABS  --   --   --  0.4  --   BASOSABS  --   --   --  0.0  --     Recent Labs  Lab 02/14/22 0532 02/15/22 0536 02/16/22 0856 02/16/22 1020 02/17/22 0518 02/18/22 0528 02/19/22 1014 02/20/22 0657  NA 133* 140 142  --  139 142  --  146*  K 3.8 4.0 3.8  --  3.6 3.6  --  3.3*  CL 105 109 111  --  109 113*  --  121*  CO2 '22 24 25  '$ --  23 22  --  19*  GLUCOSE 169* 140* 157*  --  162* 142*  --  172*  BUN 31* 37* 42*  --  57* 61*  --  23  CREATININE 2.18* 1.84* 1.66*  --  2.49* 2.82*  --  0.86  CALCIUM 10.1 10.4* 9.9  --  9.9 9.6  --  9.4  AST  --   --  16  --  24  --   --   --   ALT  --   --  19  --  32  --   --   --   ALKPHOS  --   --  70  --  75  --   --   --   BILITOT  --   --  0.8  --  0.9  --   --   --   ALBUMIN  --   --  2.7*  --  2.9*  --   --   --   MG 2.5* 2.5*  --   --   --   --   --  1.8  CRP  --   --   --   --  7.4*  --   --   --   PROCALCITON 0.71  --   --   --   --   --   --   --   TSH  --   --   --   --   --   --  0.629  --   AMMONIA  --   --   --  27  --   --  <10  --     ------------------------------------------------------------------------------------------------------------------ No results for  input(s): "CHOL", "HDL", "LDLCALC", "TRIG", "CHOLHDL", "LDLDIRECT" in the last 72 hours.  Lab Results  Component Value Date   HGBA1C 6.5 (H) 02/12/2022   ------------------------------------------------------------------------------------------------------------------ Recent Labs    02/19/22 1014  TSH 0.629    Cardiac Enzymes No results for input(s): "CKMB", "TROPONINI", "MYOGLOBIN" in the last 168 hours.  Invalid input(s): "CK" ------------------------------------------------------------------------------------------------------------------ No results found for: "BNP"  CBG: Recent Labs  Lab 02/18/22 2158 02/19/22 0733 02/19/22 1201 02/19/22 1632 02/19/22 2206  GLUCAP 145* 139* 182* 149* 140*    Recent Results (from the past 240 hour(s))  Blood Culture (routine x 2)     Status: None   Collection Time: 02/12/22 10:10 PM   Specimen: BLOOD  Result Value Ref Range Status   Specimen Description   Final    BLOOD LEFT ANTECUBITAL Performed at [facility], [place] [address] [place], [place] [address]    Special Requests   Final    BOTTLES DRAWN AEROBIC AND ANAEROBIC Blood Culture results may not be optimal due to an excessive volume of blood received in culture bottles Performed at [facility] [address] [place], [place]  [address]    Culture   Final    NO GROWTH 5 DAYS Performed at [facility], [place] [address], [place], [place] [address]    Report Status 02/18/2022 FINAL  Final  Blood Culture (routine x 2)     Status: None   Collection Time: 02/12/22 10:20 PM   Specimen: BLOOD  Result Value Ref Range Status   Specimen Description   Final    BLOOD RIGHT ANTECUBITAL Performed at [facility] [address], [place], [place] [address]    Special Requests   Final    BOTTLES DRAWN AEROBIC AND ANAEROBIC Blood Culture results may not be optimal due to an excessive volume of blood received in culture  bottles Performed at [place] [address] [place], [place] [address]    Culture   Final    NO GROWTH 5 DAYS Performed at [facility], [place] [address], [place], [place] [address]    Report Status 02/18/2022 FINAL  Final  Resp Panel by RT-PCR (Flu A&B, Covid) Anterior Nasal Swab     Status: None   Collection Time: 02/12/22 10:54 PM   Specimen: Anterior Nasal Swab  Result Value Ref Range Status   SARS Coronavirus 2 by RT PCR NEGATIVE NEGATIVE Final    Comment: (NOTE) SARS-CoV-2 target nucleic acids are NOT DETECTED.  The SARS-CoV-2 RNA is generally detectable in upper respiratory specimens during the acute phase of infection. The lowest concentration of SARS-CoV-2 viral copies this assay can detect is 138 copies/mL. A negative result does not preclude SARS-Cov-2 infection and should not be used as the sole basis for treatment or other patient management decisions. A negative result may occur with  improper specimen collection/handling, submission of specimen other than nasopharyngeal swab, presence of viral mutation(s) within the areas targeted by this assay, and inadequate number of viral copies(<138 copies/mL). A negative result must be combined with clinical observations, patient history, and epidemiological information. The expected result is Negative.  Fact Sheet for Patients:  EntrepreneurPulse.com.au  Fact Sheet for Healthcare Providers:  IncredibleEmployment.be  This test is no t yet approved or cleared by the [place] FDA and  has been authorized for detection and/or diagnosis of SARS-CoV-2 by FDA under an Emergency Use Authorization (EUA). This EUA will remain  in effect (meaning this test can be used) for the duration of the COVID-19 declaration under Section 564(b)(1) of the Act, 21 U.S.C.section 360bbb-3(b)(1), unless the authorization is terminated  or revoked sooner.       Influenza A by  PCR NEGATIVE NEGATIVE Final   Influenza B by PCR NEGATIVE NEGATIVE Final    Comment: (NOTE) The Xpert Xpress SARS-CoV-2/FLU/RSV plus assay is intended as an aid in the diagnosis of influenza from Nasopharyngeal swab specimens and should not be used as a sole basis for treatment. Nasal washings and aspirates are unacceptable for Xpert Xpress SARS-CoV-2/FLU/RSV testing.  Fact Sheet for Patients: EntrepreneurPulse.com.au  Fact Sheet for Healthcare Providers: IncredibleEmployment.be  This test is not yet approved or cleared by the [place] FDA and has been authorized for detection and/or diagnosis of SARS-CoV-2 by FDA under an Emergency Use Authorization (EUA). This EUA will remain in effect (meaning this test can be used) for the duration of the COVID-19 declaration under Section 564(b)(1) of the Act, 21 U.S.C. section 360bbb-3(b)(1), unless the authorization is terminated or revoked.  Performed at [facility], [place] [address], [place], [place] [address]   Urine Culture  Status: Abnormal   Collection Time: 02/12/22 11:53 PM   Specimen: Urine, Clean Catch  Result Value Ref Range Status   Specimen Description   Final    URINE, CLEAN CATCH Performed at [facility], [place] [address], [place], [place] [address]    Special Requests   Final    NONE Performed at [facility], [place] [address], [place], [place] [address]    Culture (A)  Final    >=100,000 COLONIES/mL GROUP B STREP(S.AGALACTIAE)ISOLATED TESTING AGAINST S. AGALACTIAE NOT ROUTINELY PERFORMED DUE TO PREDICTABILITY OF AMP/PEN/VAN SUSCEPTIBILITY. Performed at [facility], [place] [address] [place], [place] [address]    Report Status 02/14/2022 FINAL  Final     Radiology Studies: No results found.   Marzetta Board, MD, PhD [facility]  Between 7 am - 7 pm I am available, please contact me via Amion (for  emergencies) or Securechat (non urgent messages)  Between 7 pm - 7 am I am not available, please contact night coverage MD/APP via Amion

## 2022-02-21 DIAGNOSIS — J189 Pneumonia, unspecified organism: Secondary | ICD-10-CM | POA: Diagnosis not present

## 2022-02-21 DIAGNOSIS — A419 Sepsis, unspecified organism: Secondary | ICD-10-CM | POA: Diagnosis not present

## 2022-02-21 LAB — CBC
HCT: 24.1 % — ABNORMAL LOW (ref 39.0–52.0)
Hemoglobin: 7.7 g/dL — ABNORMAL LOW (ref 13.0–17.0)
MCH: 27.9 pg (ref 26.0–34.0)
MCHC: 32 g/dL (ref 30.0–36.0)
MCV: 87.3 fL (ref 80.0–100.0)
Platelets: 137 10*3/uL — ABNORMAL LOW (ref 150–400)
RBC: 2.76 MIL/uL — ABNORMAL LOW (ref 4.22–5.81)
RDW: 13.9 % (ref 11.5–15.5)
WBC: 10.4 10*3/uL (ref 4.0–10.5)
nRBC: 0 % (ref 0.0–0.2)

## 2022-02-21 LAB — BASIC METABOLIC PANEL
Anion gap: 4 — ABNORMAL LOW (ref 5–15)
BUN: 18 mg/dL (ref 8–23)
CO2: 23 mmol/L (ref 22–32)
Calcium: 8.9 mg/dL (ref 8.9–10.3)
Chloride: 119 mmol/L — ABNORMAL HIGH (ref 98–111)
Creatinine, Ser: 0.83 mg/dL (ref 0.61–1.24)
GFR, Estimated: >60 mL/min (ref >60)
Glucose, Bld: 154 mg/dL — ABNORMAL HIGH (ref 70–99)
Potassium: 3.6 mmol/L (ref 3.5–5.1)
Sodium: 146 mmol/L — ABNORMAL HIGH (ref 135–145)

## 2022-02-21 LAB — GLUCOSE, CAPILLARY
Glucose-Capillary: 137 mg/dL — ABNORMAL HIGH (ref 70–99)
Glucose-Capillary: 156 mg/dL — ABNORMAL HIGH (ref 70–99)
Glucose-Capillary: 187 mg/dL — ABNORMAL HIGH (ref 70–99)
Glucose-Capillary: 167 mg/dL — ABNORMAL HIGH (ref 70–99)

## 2022-02-21 LAB — MAGNESIUM: Magnesium: 1.7 mg/dL (ref 1.7–2.4)

## 2022-02-21 MED ORDER — POLYETHYLENE GLYCOL 3350 17 G PO PACK
17.0000 g | PACK | Freq: Every day | ORAL | Status: DC
  Administered 2022-02-21 – 2022-02-25 (×5): 17 g via ORAL
  Filled 2022-02-21 (×5): qty 1

## 2022-02-21 NOTE — Progress Notes (Signed)
PROGRESS NOTE  YON SCHIFFMAN JHE:[id number] DOB: 09-15-1931 DOA: 02/12/2022 PCP: Collene Leyden, MD   LOS: 9 days   Brief Narrative / Interim history: 88 y.o. male with PMH significant for DM2, HTN, HLD, vascular dementia, BPH, admitted on 10/25 with complaint of fever, cough as well as hematuria for 4 days.  In the ED chest x-ray showed left basilar pneumonia, COVID/flu was negative.  Empirically started on Rocephin and azithromycin.  There was concerns of acute urinary retention requiring coud atheter placement.  CT abdomen pelvis showed bilateral hydronephrosis with enlarged prostate, urology was consulted  Subjective / 24h Interval events: Calm this morning, remains confused.  Keeps his eyes closed during our interaction.  Has no complaints.  Wife is at bedside.  Assesement and Plan: Principal Problem:   Sepsis due to pneumonia [facility]) Active Problems:   Diabetes mellitus without complication ([place])   Hypertension   Pure hypercholesterolemia   Dementia ([place])   Hypokalemia   Hematuria   Principal problem Sepsis POA secondary to left basilar pneumonia and urinary tract infection -Sepsis parameters are slowly improving.  Urine cultures showed  10^5 group B strep.  He has completed treatment with ceftriaxone, white count is normalized  Active problems  Left basilar pneumonia -Procalcitonin was 0.71, completed course of Rocephin and azithromycin.  Now doing well on room air.  As needed bronchodilators.   UTI with hematuria- completed Rocephin   Gross hematuria, bilateral hydronephrosis with severe prostatomegaly-Urology team is following.  Coud catheter is currently in place.  Continue finasteride and Flomax.  Intermittent concerns of clotting therefore undergoing CBI.  Patient urology follow-up  Acute blood loss anemia-likely in the setting of hematuria, hemoglobin dipped to 7.7.  Closely monitor, transfuse for hemoglobin less than 7   AKI -Baseline creatinine is 1.1, it trended  up and peaked at 2.8.  Currently coud catheter in place, getting IV fluid and CBI.  Suspect most of this is postobstructive.  Creatinine has now normalized at 0.86 this morning  Hypernatremia-closely monitor, has significant urine output consistent with postobstructive diuresis.  Matching ins and outs evaluate better today, sodium stabilized   Altered mental status, underlying mild dementia -From underlying infection and underlying dementia.  CT of the head did not show any acute pathology.  He is on Namenda, Lexapro, Seroquel and melatonin.  Seroquel has been changed to nighttime.  Mental status better   Type 2 diabetes mellitus -A1c 6.5 on 10/25.  Home p.o. medications on hold. Currently on sliding scale and Accu-Cheks.  CBGs  CBG (last 3)  Recent Labs    02/20/22 1649 02/20/22 2105 02/21/22 0709  GLUCAP 172* 176* 137*     Essential hypertension  -His losartan, spironolactone, HCTZ are on hold due to AKI.  Started on amlodipine yesterday, continue   Hyperlipidemia -Continue Lipitor   Scheduled Meds:  atorvastatin  10 mg Oral QHS   Chlorhexidine Gluconate Cloth  6 each Topical Daily   escitalopram  10 mg Oral Daily   finasteride  5 mg Oral Daily   insulin aspart  0-5 Units Subcutaneous QHS   insulin aspart  0-6 Units Subcutaneous TID WC   melatonin  3 mg Oral QHS   memantine  10 mg Oral BID   mouth rinse  15 mL Mouth Rinse 4 times per day   polyethylene glycol  17 g Oral Daily   QUEtiapine  25 mg Oral QHS   sodium chloride  1 drop Both Eyes QHS   sodium chloride flush  3  mL Intravenous Q12H   tamsulosin  0.4 mg Oral BID   Continuous Infusions:  sodium chloride 100 mL/hr at 02/21/22 0444   PRN Meds:.acetaminophen **OR** acetaminophen, guaiFENesin, hydrALAZINE, ipratropium-albuterol, metoprolol tartrate, ondansetron **OR** ondansetron (ZOFRAN) IV, mouth rinse, senna-docusate, traZODone  Current Outpatient Medications  Medication Instructions   atorvastatin (LIPITOR) 10 mg,  Oral, Daily at bedtime   carboxymethylcellulose (REFRESH PLUS) 0.5 % SOLN 1 drop, 3 times daily PRN   escitalopram (LEXAPRO) 10 mg, Oral, Daily   finasteride (PROSCAR) 5 mg, Oral, Daily, For massive prostate and urinary bleeding.   FREESTYLE LITE test strip 1 each, Daily   glimepiride (AMARYL) 2 mg, Oral, Daily with breakfast   glucose blood (FREESTYLE LITE) test strip USE 1 STRIP TO TEST BLOOD SUGAR DAILY   Guaifenesin (MUCINEX MAXIMUM STRENGTH) 1,200 mg, Oral, Daily   Lancets (FREESTYLE) lancets Daily   levocetirizine (XYZAL) 5 MG tablet 1 tablet, Oral, Every evening   losartan (COZAAR) 50 mg, Oral, Daily at bedtime   memantine (NAMENDA TITRATION PACK) tablet pack As directed on pack   metFORMIN (GLUCOPHAGE) 500 mg, Oral, Daily with supper   polyethylene glycol (MIRALAX / GLYCOLAX) 17 g, Oral, Daily   sodium chloride (MURO 128) 2 % ophthalmic solution 1 drop, Both Eyes, Daily at bedtime   spironolactone-hydrochlorothiazide (ALDACTAZIDE) 25-25 MG tablet 1 tablet, Oral, Every morning   tamsulosin (FLOMAX) 0.4 mg, 2 times daily    Diet Orders (From admission, onward)     Start     Ordered   02/13/22 0037  Diet regular Room service appropriate? Yes; Fluid consistency: Thin  Diet effective now       Question Answer Comment  Room service appropriate? Yes   Fluid consistency: Thin      02/13/22 0039            DVT prophylaxis: SCDs Start: 02/13/22 0047   Lab Results  Component Value Date   PLT 137 (L) 02/21/2022      Code Status: Full Code  Family Communication: wife at bedside  Status is: Inpatient, CBI, confusion  Level of care: Telemetry  Consultants:  Urology   Objective: Vitals:   02/20/22 1243 02/20/22 2007 02/21/22 0456 02/21/22 0500  BP: (!) 114/49 (!) 155/78 (!) 146/61   Pulse: 71 92 (!) 59   Resp: '17 20 20   '$ Temp: (!) 97.2 F (36.2 C) 99.4 F (37.4 C) 99.4 F (37.4 C)   TempSrc: Oral Oral Oral   SpO2: 94% 91% 96%   Weight:    83.5 kg  Height:         Intake/Output Summary (Last 24 hours) at 02/21/2022 1052 Last data filed at 02/21/2022 0648 Gross per 24 hour  Intake 7243.99 ml  Output 6250 ml  Net 993.99 ml    Wt Readings from Last 3 Encounters:  02/21/22 83.5 kg  01/09/22 83 kg  08/26/21 83.3 kg    Examination:  Constitutional: NAD Eyes: lids and conjunctivae normal, no scleral icterus ENMT: mmm Neck: normal, supple Respiratory: clear to auscultation bilaterally, no wheezing, no crackles. Normal respiratory effort.  Cardiovascular: Regular rate and rhythm, no murmurs / rubs / gallops. No LE edema. Abdomen: soft, no distention, no tenderness. Bowel sounds positive.  Skin: no rashes  Data Reviewed: I have independently reviewed following labs and imaging studies   CBC Recent Labs  Lab 02/16/22 0856 02/17/22 0518 02/18/22 0528 02/20/22 0657 02/21/22 0535  WBC 12.2* 14.1* 12.9* 11.6* 10.4  HGB 10.1* 10.5* 9.6* 9.4* 7.7*  HCT 31.4* 31.2* 30.4* 28.6* 24.1*  PLT 233 215 184 161 137*  MCV 86.7 84.1 88.6 86.4 87.3  MCH 27.9 28.3 28.0 28.4 27.9  MCHC 32.2 33.7 31.6 32.9 32.0  RDW 13.5 13.3 13.6 13.5 13.9  LYMPHSABS  --   --  1.3  --   --   MONOABS  --   --  1.5*  --   --   EOSABS  --   --  0.4  --   --   BASOSABS  --   --  0.0  --   --      Recent Labs  Lab 02/15/22 0536 02/16/22 0856 02/16/22 1020 02/17/22 0518 02/18/22 0528 02/19/22 1014 02/20/22 0657 02/21/22 0535  NA 140 142  --  139 142  --  146* 146*  K 4.0 3.8  --  3.6 3.6  --  3.3* 3.6  CL 109 111  --  109 113*  --  121* 119*  CO2 24 25  --  23 22  --  19* 23  GLUCOSE 140* 157*  --  162* 142*  --  172* 154*  BUN 37* 42*  --  57* 61*  --  23 18  CREATININE 1.84* 1.66*  --  2.49* 2.82*  --  0.86 0.83  CALCIUM 10.4* 9.9  --  9.9 9.6  --  9.4 8.9  AST  --  16  --  24  --   --   --   --   ALT  --  19  --  32  --   --   --   --   ALKPHOS  --  70  --  75  --   --   --   --   BILITOT  --  0.8  --  0.9  --   --   --   --   ALBUMIN  --  2.7*  --   2.9*  --   --   --   --   MG 2.5*  --   --   --   --   --  1.8 1.7  CRP  --   --   --  7.4*  --   --   --   --   TSH  --   --   --   --   --  0.629  --   --   AMMONIA  --   --  27  --   --  <10  --   --      ------------------------------------------------------------------------------------------------------------------ No results for input(s): "CHOL", "HDL", "LDLCALC", "TRIG", "CHOLHDL", "LDLDIRECT" in the last 72 hours.  Lab Results  Component Value Date   HGBA1C 6.5 (H) 02/12/2022   ------------------------------------------------------------------------------------------------------------------ Recent Labs    02/19/22 1014  TSH 0.629     Cardiac Enzymes No results for input(s): "CKMB", "TROPONINI", "MYOGLOBIN" in the last 168 hours.  Invalid input(s): "CK" ------------------------------------------------------------------------------------------------------------------ No results found for: "BNP"  CBG: Recent Labs  Lab 02/19/22 2206 02/20/22 1131 02/20/22 1649 02/20/22 2105 02/21/22 0709  GLUCAP 140* 199* 172* 176* 137*     Recent Results (from the past 240 hour(s))  Blood Culture (routine x 2)     Status: None   Collection Time: 02/12/22 10:10 PM   Specimen: BLOOD  Result Value Ref Range Status   Specimen Description   Final    BLOOD LEFT ANTECUBITAL Performed at [facility], [place] [address] [place], [place]  [address]    Special Requests   Final    BOTTLES DRAWN AEROBIC AND ANAEROBIC Blood Culture results may not be optimal due to an excessive volume of blood received in culture bottles Performed at [place] [address], [place], [place] [address]    Culture   Final    NO GROWTH 5 DAYS Performed at [facility], [place] [address], [place], [place] [address]    Report Status 02/18/2022 FINAL  Final  Blood Culture (routine x 2)     Status: None   Collection Time: 02/12/22 10:20 PM   Specimen: BLOOD   Result Value Ref Range Status   Specimen Description   Final    BLOOD RIGHT ANTECUBITAL Performed at [place] [address], [place], [place] [address]    Special Requests   Final    BOTTLES DRAWN AEROBIC AND ANAEROBIC Blood Culture results may not be optimal due to an excessive volume of blood received in culture bottles Performed at [place] [address], [place], [place] [address]    Culture   Final    NO GROWTH 5 DAYS Performed at [facility], [place] [address] [place],  [address]    Report Status 02/18/2022 FINAL  Final  Resp Panel by RT-PCR (Flu A&B, Covid) Anterior Nasal Swab     Status: None   Collection Time: 02/12/22 10:54 PM   Specimen: Anterior Nasal Swab  Result Value Ref Range Status   SARS Coronavirus 2 by RT PCR NEGATIVE NEGATIVE Final    Comment: (NOTE) SARS-CoV-2 target nucleic acids are NOT DETECTED.  The SARS-CoV-2 RNA is generally detectable in upper respiratory specimens during the acute phase of infection. The lowest concentration of SARS-CoV-2 viral copies this assay can detect is 138 copies/mL. A negative result does not preclude SARS-Cov-2 infection and should not be used as the sole basis for treatment or other patient management decisions. A negative result may occur with  improper specimen collection/handling, submission of specimen other than nasopharyngeal swab, presence of viral mutation(s) within the areas targeted by this assay, and inadequate number of viral copies(<138 copies/mL). A negative result must be combined with clinical observations, patient history, and epidemiological information. The expected result is Negative.  Fact Sheet for Patients:  EntrepreneurPulse.com.au  Fact Sheet for Healthcare Providers:  IncredibleEmployment.be  This test is no t yet approved or cleared by the [place] FDA and  has been authorized for detection  and/or diagnosis of SARS-CoV-2 by FDA under an Emergency Use Authorization (EUA). This EUA will remain  in effect (meaning this test can be used) for the duration of the COVID-19 declaration under Section 564(b)(1) of the Act, 21 U.S.C.section 360bbb-3(b)(1), unless the authorization is terminated  or revoked sooner.       Influenza A by PCR NEGATIVE NEGATIVE Final   Influenza B by PCR NEGATIVE NEGATIVE Final    Comment: (NOTE) The Xpert Xpress SARS-CoV-2/FLU/RSV plus assay is intended as an aid in the diagnosis of influenza from Nasopharyngeal swab specimens and should not be used as a sole basis for treatment. Nasal washings and aspirates are unacceptable for Xpert Xpress SARS-CoV-2/FLU/RSV testing.  Fact Sheet for Patients: EntrepreneurPulse.com.au  Fact Sheet for Healthcare Providers: IncredibleEmployment.be  This test is not yet approved or cleared by the [place] FDA and has been authorized for detection and/or diagnosis of SARS-CoV-2 by FDA under an Emergency Use Authorization (EUA). This EUA will remain in effect (meaning  this test can be used) for the duration of the COVID-19 declaration under Section 564(b)(1) of the Act, 21 U.S.C. section 360bbb-3(b)(1), unless the authorization is terminated or revoked.  Performed at [facility], [place] [address], [place], [place] [address]   Urine Culture     Status: Abnormal   Collection Time: 02/12/22 11:53 PM   Specimen: Urine, Clean Catch  Result Value Ref Range Status   Specimen Description   Final    URINE, CLEAN CATCH Performed at [facility], [place] [address] [place], [place] [address]    Special Requests   Final    NONE Performed at [facility], [place] [address] [place], [place] [address]    Culture (A)  Final    >=100,000 COLONIES/mL GROUP B STREP(S.AGALACTIAE)ISOLATED TESTING AGAINST S. AGALACTIAE NOT ROUTINELY  PERFORMED DUE TO PREDICTABILITY OF AMP/PEN/VAN SUSCEPTIBILITY. Performed at [facility], [place] [address], [place], [place] [address]    Report Status 02/14/2022 FINAL  Final     Radiology Studies: No results found.   Marzetta Board, MD, PhD [facility]  Between 7 am - 7 pm I am available, please contact me via Amion (for emergencies) or Securechat (non urgent messages)  Between 7 pm - 7 am I am not available, please contact night coverage MD/APP via Amion

## 2022-02-21 NOTE — TOC Progression Note (Addendum)
Transition of Care [facility]) - Progression Note    Patient Details  Name: Francisco Leon MRN: [id number] Date of Birth: Aug 06, 1931  Transition of Care [facility]) CM/SW [place], LCSW Phone Number: 02/21/2022, 10:39 AM  Clinical Narrative:    Met with pt's wife who has accepted bed offer for SNF at [facility].   Update 2:45pm- Received message from Dr. Hilma Favors regarding pt's spouse not wanting pt to go to SNF. Per Dr. Hilma Favors would qualify for hospice care.  Met with pt's spouse who shares that her family is having a "uproar" about pt needing to go to SNF and they do not want this to happen. Pt's wife also shut down conversations about hospice services when CSW brought them up. Pt's spouse states she does not understand what would make him qualify for hospice and states he is in "great" health.   She is interested in pt returning home with 24/7 care in place. CSW discussed that this would be private pay and due to it being private pay she and family would have to set this up on their own. Pt's spouse was understanding and agreeable to this. CSW provided pt's spouse with a list of home care agencies.  HHPT/OT/RN/Aide has been arranged with [facility] for additional supports for this pt and family.  Pt's spouse is also requesting a BSC and will think about any additional DME they may need.    Expected Discharge Plan: [place] Barriers to Discharge: Continued Medical Work up  Expected Discharge Plan and Services Expected Discharge Plan: [place] In-house Referral: NA Discharge Planning Services: NA Post Acute Care Choice: [place] arrangements for the past 2 months: Single Family Home                 DME Arranged: N/A DME Agency: NA       HH Arranged: PT HH Agency: [place] Date [place]: 02/17/22 Time [place]: 1133 Representative spoke with at [place]: [place] ([place]) Interventions    Readmission Risk Interventions    02/21/2022   10:38 AM 02/17/2022   11:32 AM  Readmission Risk Prevention Plan  Transportation Screening Complete Complete  PCP or Specialist Appt within 5-7 Days Complete Complete  Home Care Screening Complete Complete  Medication Review (RN CM) Complete Complete

## 2022-02-22 DIAGNOSIS — J189 Pneumonia, unspecified organism: Secondary | ICD-10-CM | POA: Diagnosis not present

## 2022-02-22 DIAGNOSIS — A419 Sepsis, unspecified organism: Secondary | ICD-10-CM | POA: Diagnosis not present

## 2022-02-22 LAB — GLUCOSE, CAPILLARY
Glucose-Capillary: 152 mg/dL — ABNORMAL HIGH (ref 70–99)
Glucose-Capillary: 144 mg/dL — ABNORMAL HIGH (ref 70–99)
Glucose-Capillary: 189 mg/dL — ABNORMAL HIGH (ref 70–99)
Glucose-Capillary: 177 mg/dL — ABNORMAL HIGH (ref 70–99)

## 2022-02-22 LAB — CBC
HCT: 25.9 % — ABNORMAL LOW (ref 39.0–52.0)
Hemoglobin: 8.3 g/dL — ABNORMAL LOW (ref 13.0–17.0)
MCH: 27.8 pg (ref 26.0–34.0)
MCHC: 32 g/dL (ref 30.0–36.0)
MCV: 86.6 fL (ref 80.0–100.0)
Platelets: 152 10*3/uL (ref 150–400)
RBC: 2.99 MIL/uL — ABNORMAL LOW (ref 4.22–5.81)
RDW: 13.7 % (ref 11.5–15.5)
WBC: 10.5 10*3/uL (ref 4.0–10.5)
nRBC: 0 % (ref 0.0–0.2)

## 2022-02-22 LAB — BASIC METABOLIC PANEL
Anion gap: 3 — ABNORMAL LOW (ref 5–15)
BUN: 15 mg/dL (ref 8–23)
CO2: 24 mmol/L (ref 22–32)
Calcium: 9.3 mg/dL (ref 8.9–10.3)
Chloride: 115 mmol/L — ABNORMAL HIGH (ref 98–111)
Creatinine, Ser: 0.83 mg/dL (ref 0.61–1.24)
GFR, Estimated: >60 mL/min (ref >60)
Glucose, Bld: 157 mg/dL — ABNORMAL HIGH (ref 70–99)
Potassium: 3.5 mmol/L (ref 3.5–5.1)
Sodium: 142 mmol/L (ref 135–145)

## 2022-02-22 MED ORDER — LOSARTAN POTASSIUM 50 MG PO TABS
50.0000 mg | ORAL_TABLET | Freq: Every day | ORAL | Status: DC
  Administered 2022-02-22 – 2022-02-25 (×4): 50 mg via ORAL
  Filled 2022-02-22 (×4): qty 1

## 2022-02-22 NOTE — Social Work (Signed)
CSW spoke to the family they are making rounds to the 2 facility, once the family gives me an answer on the facility CSW will start the [place]. TOC will continue to follow for DC needs.

## 2022-02-22 NOTE — Progress Notes (Addendum)
Daily Progress Note   Patient Name: Francisco Leon       Date: 02/22/2022 DOB: 12-27-1931  Age: 88 y.o. MRN#: [id number] Attending Physician: Terrilee Croak, MD Primary Care Physician: Collene Leyden, MD Admit Date: 02/12/2022  Reason for Consultation/Follow-up: Establishing goals of care  Subjective: Patient sleeping soundly. Wife at bedside. Wife reports that patient is participating with PT, she has discussed with her family regarding disposition options, long discussion today with patient's wife regarding his condition and overall goals of care see below.     Length of Stay: 10  Current Medications: Scheduled Meds:   atorvastatin  10 mg Oral QHS   Chlorhexidine Gluconate Cloth  6 each Topical Daily   escitalopram  10 mg Oral Daily   finasteride  5 mg Oral Daily   insulin aspart  0-5 Units Subcutaneous QHS   insulin aspart  0-6 Units Subcutaneous TID WC   losartan  50 mg Oral Daily   melatonin  3 mg Oral QHS   memantine  10 mg Oral BID   mouth rinse  15 mL Mouth Rinse 4 times per day   polyethylene glycol  17 g Oral Daily   QUEtiapine  25 mg Oral QHS   sodium chloride  1 drop Both Eyes QHS   sodium chloride flush  3 mL Intravenous Q12H   tamsulosin  0.4 mg Oral BID    Continuous Infusions:    PRN Meds: acetaminophen **OR** acetaminophen, guaiFENesin, hydrALAZINE, ipratropium-albuterol, metoprolol tartrate, ondansetron **OR** ondansetron (ZOFRAN) IV, mouth rinse, senna-docusate, traZODone  Physical Exam Constitutional:      General: He is not in acute distress.    Appearance: He is ill-appearing.     Comments: Sleeping, does not wake to voice or gentle touch  Pulmonary:     Effort: Pulmonary effort is normal.  Skin:    General: Skin is warm and dry.             Vital Signs: BP  (!) 155/79 (BP Location: Right Arm)   Pulse 71   Temp 97.6 F (36.4 C)   Resp 20   Ht '5\' 6"'$  (1.676 m)   Wt 83.5 kg   SpO2 95%   BMI 29.71 kg/m  SpO2: SpO2: 95 % O2 Device: O2 Device: Room Air O2 Flow Rate: O2 Flow Rate (L/min): 1 L/min  Intake/output summary:  Intake/Output Summary (Last 24 hours) at 02/22/2022 1158 Last data filed at 02/22/2022 0900 Gross per 24 hour  Intake 240 ml  Output 1500 ml  Net -1260 ml    LBM: Last BM Date : 02/18/22 Baseline Weight: Weight: 83 kg Most recent weight: Weight: 83.5 kg       Palliative Assessment/Data:PPS 20% at this point r/t mental status      Patient Active Problem List   Diagnosis Date Noted   Diabetes mellitus without complication ([place]) 50/27/7412   Hypertension 02/13/2022   Pure hypercholesterolemia 02/13/2022   Dementia ([place]) 02/13/2022   Hypokalemia 02/13/2022   Hematuria 02/13/2022   Sepsis due to pneumonia [facility]) 02/12/2022    Palliative Care Assessment & Plan   HPI: 88 y.o. male  with past medical history of hypertension, hyperlipidemia, type 2 diabetes, vascular dementia,  hearing loss, and BPH admitted on 02/12/2022 with cough and hematuria. Patient diagnosed with sepsis d/t pna. Patient also with acute urinary retention - foley placed. Patient with new AKI. PMT consulted to discuss [place].   Assessment: Follow up today. Patient sleeping during my visit. Wife reports patient's mental status is improved, and that he is participating in PT efforts. Patient's wife has discussed with her family members and family friends, the overall plan is to attempt SNF rehab to attempt efforts at PT, then home with  home health care, not long term facility care.   Recommendations/Plan: Continue with full code/full scope care -    SNF rehab with palliative, then home with palliative, no long term care at facility.   Code Status: Full code  Care plan was discussed with wife  Thank you for allowing the Palliative Medicine Team  to assist in the care of this patient. Mod MDM *Please note that this is a verbal dictation therefore any spelling or grammatical errors are due to the "[place]" system interpretation.  Loistine Chance MD Palliative Medicine Team Team Phone # [phone]

## 2022-02-22 NOTE — Evaluation (Signed)
Clinical/Bedside Swallow Evaluation Patient Details  Name: Francisco Leon MRN: [id number] Date of Birth: 1931/09/07  Today's Date: 02/22/2022 Time: SLP Start Time (ACUTE ONLY): 1400 SLP Stop Time (ACUTE ONLY): 1420 SLP Time Calculation (min) (ACUTE ONLY): 20 min  Past Medical History:  Past Medical History:  Diagnosis Date   Allergic rhinitis    Balance disorder    BPH (benign prostatic hyperplasia)    Diabetes mellitus without complication (HCC)    Elevated cholesterol    Generalized edema    Hearing loss    Hypertension    Pure hypercholesterolemia    PVC (premature ventricular contraction)    Past Surgical History:  Past Surgical History:  Procedure Laterality Date   CHOLECYSTECTOMY  2009   TONSILLECTOMY     HPI:  Patient is an 88 y.o. male with PMH: vascular dementia, hearing deficits, DM, PVC who was admitted to the hospital from home on 02/12/2022 with sepsis secondary to PNA, UTI and AKI. SLP swallow evaluation ordered on 02/21/22 due to concern for delayed swallowing and wet cough.    Assessment / Plan / Recommendation  Clinical Impression  Patient presenting with what appears to be age-appropriate swallow function with intermittent, inconsistent delayed cough when drinking water and eating saltine cracker with peanut butter; cough was dry sounding and non-productive; difficult to differentiate cough from dysphagia versus cough from PNA. Swallow initiation was suspected to be mildly delayed but pharyngeal contraction appeared [facility]. SLP discussed with spouse recommendations to avoid foods that are dry or to moisten and/or have liquids on hand to drink to help moisten. SLP not recommending further skilled intervention at this time but please reorder if concern continues for dysphagia. SLP Visit Diagnosis: Dysphagia, unspecified (R13.10)    Aspiration Risk  Mild aspiration risk    Diet Recommendation Regular;Thin liquid   Liquid Administration via: Straw;Cup Medication  Administration: Whole meds with puree Supervision: Full supervision/cueing for compensatory strategies;Staff to assist with self feeding;Patient able to self feed Compensations: Minimize environmental distractions;Slow rate;Small sips/bites Postural Changes: Seated upright at 90 degrees    Other  Recommendations Oral Care Recommendations: Oral care BID;Staff/trained caregiver to provide oral care    Recommendations for follow up therapy are one component of a multi-disciplinary discharge planning process, led by the attending physician.  Recommendations may be updated based on patient status, additional functional criteria and insurance authorization.  Follow up Recommendations No SLP follow up      Assistance Recommended at Discharge Frequent or constant Supervision/Assistance  Functional Status Assessment Patient has not had a recent decline in their functional status  Frequency and Duration   N/A         Prognosis   N/A     Swallow Study   General Date of Onset: 02/22/22 HPI: Patient is an 88 y.o. male with PMH: vascular dementia, hearing deficits, DM, PVC who was admitted to the hospital from home on 02/12/2022 with sepsis secondary to PNA, UTI and AKI. SLP swallow evaluation ordered on 02/21/22 due to concern for delayed swallowing and wet cough. Type of Study: Bedside Swallow Evaluation Previous Swallow Assessment: none found Diet Prior to this Study: Regular;Thin liquids Temperature Spikes Noted: No Respiratory Status: Room air History of Recent Intubation: No Behavior/Cognition: Alert;Cooperative;Pleasant mood Oral Cavity Assessment: Within Functional Limits Oral Care Completed by SLP: No Oral Cavity - Dentition: Adequate natural dentition;Dentures, top Self-Feeding Abilities: Able to feed self;Needs assist;Needs set up Patient Positioning: Upright in bed Baseline Vocal Quality: Normal Volitional Cough: Cognitively unable to elicit  Volitional Swallow: Able to elicit     Oral/Motor/Sensory Function Overall Oral Motor/Sensory Function: Within functional limits   Ice Chips     Thin Liquid Thin Liquid: Impaired Presentation: Straw;Self Fed Pharyngeal  Phase Impairments: Suspected delayed Swallow;Cough - Delayed    Nectar Thick     Honey Thick     Puree Puree: Not tested   Solid     Solid: Impaired Oral Phase Impairments: Impaired mastication Oral Phase Functional Implications: Prolonged oral transit Pharyngeal Phase Impairments: Suspected delayed Swallow      Sonia Baller, MA, CCC-SLP Speech Therapy

## 2022-02-22 NOTE — Progress Notes (Signed)
PROGRESS NOTE  Francisco Leon  DOB: [birth year]  PCP: Collene Leyden, MD ELF:[id number]  DOA: 02/12/2022  LOS: 10 days  Hospital Day: 11  Brief narrative: Francisco Leon is a 88 y.o. male with PMH significant for DM2, HTN, HLD, vascular dementia, BPH. /25, patient presented admitted on 10/25 from home with complaint of fever, cough as well as hematuria for 4 days.  In the ED chest x-ray showed left basilar pneumonia COVID/flu was negative.   There was concerns of acute urinary retention requiring coud atheter placement.   CT abdomen pelvis showed bilateral hydronephrosis with enlarged prostate Empirically started on Rocephin and azithromycin.   Admitted to [facility].   Urology was consulted   Subjective: Patient was seen and examined this morning.  Pleasant elderly Caucasian male.  Somnolent but opens eyes and verbal command.  Not yet back to baseline but mental status much better than how he was last week on my evaluation.  Wife at bedside and she believes he is gradually improving Chart reviewed No fever, heart rate in 70s, blood pressure 150s, breathing on room air  Assessment and plan: Sepsis POA  Secondary to pneumonia and UTI Sepsis parameters improving on IV antibiotics See management of individual issues below Recent Labs  Lab 02/17/22 0518 02/18/22 0528 02/20/22 0657 02/21/22 0535 02/22/22 0721  WBC 14.1* 12.9* 11.6* 10.4 10.5   Left basilar pneumonia Presented with fever and cough.   Chest x-ray on admission showed left basilar pneumonia.  Tobis count is elevated.  Procalcitonin was 0.71.  Completed course of Rocephin and azithromycin.   Currently breathing on room air Continue bronchodilators.   UTI with hematuria Urine cultures showed >100,000 CFU per mL of group B strep. completed Rocephin.   Gross hematuria bilateral hydronephrosis with severe prostatomegaly Urology was consulted.  Coud catheter is currently in place. Intermittent concerns of clotting  therefore undergoing CBI.  Continue finasteride 5 mg daily and Flomax 0.4 mg twice daily.   Acute blood loss anemia Secondary to hematuria.  Hemoglobin at baseline more than 10.  Currently running close to 8.  Continue to monitor. Recent Labs    02/17/22 0518 02/18/22 0528 02/20/22 0657 02/21/22 0535 02/22/22 0721  HGB 10.5* 9.6* 9.4* 7.7* 8.3*  MCV 84.1 88.6 86.4 87.3 86.6   AKI Baseline creatinine is 1.1, creatinine trended up and peaked at 2.8.  Likely due to sepsis as well as urinary obstruction. Creatinine gradually improved to normal. Recent Labs    02/12/22 2210 02/13/22 0407 02/14/22 0532 02/15/22 0536 02/16/22 0856 02/17/22 0518 02/18/22 0528 02/20/22 0657 02/21/22 0535 02/22/22 0721  BUN 19 17 31* 37* 42* 57* 61* '23 18 15  '$ CREATININE 1.10 1.00 2.18* 1.84* 1.66* 2.49* 2.82* 0.86 0.83 0.83    Hypernatremia Sodium level normalized.   Recent Labs  Lab 02/16/22 0856 02/17/22 0518 02/18/22 0528 02/20/22 0657 02/21/22 0535 02/22/22 0721  NA 142 139 142 146* 146* 585    Acute metabolic encephalopathy Underlying mild dementia CT of the head did not show any acute pathology.   PTA on Namenda 10 mg twice daily, Lexapro 10 mg daily.  Continue the same.   Currently also on Seroquel 25 mg nightly and melatonin 3 mg nightly.  Trazodone 50 mg nightly as needed  Type 2 diabetes mellitus A1c 6.5 on 02/12/2022 PTA on glimepiride 2 mg daily, metformin 500 mg daily. Currently on hold. Currently on sliding scale insulin with Accu-Cheks Recent Labs  Lab 02/21/22 1128 02/21/22 1614 02/21/22 2249 02/22/22 2778  02/22/22 1147  GLUCAP 156* 187* 167* 152* 144*   Essential hypertension PTA on losartan 50 mg daily, spironolactone 25 mg daily, HCTZ 25 mg daily, blood pressure meds were held initially because of AKI.  Creatinine normalized.  Blood pressure elevated to 150s.  Resume losartan 25 mg daily today.   Hyperlipidemia Continue Lipitor   Goals of care   Code  Status: Full Code    Mobility: Encourage ambulation.  Skin assessment:     Nutritional status:  Body mass index is 29.71 kg/m.          Diet:  Diet Order             Diet regular Room service appropriate? Yes; Fluid consistency: Thin  Diet effective now                   DVT prophylaxis:  SCDs Start: 02/13/22 0047   Antimicrobials: Completed a course of antibiotics Fluid: None Consultants: None Family Communication: Wife at bedside  Status is: Inpatient  Continue in-hospital care because: Pending SNF Level of care: Telemetry   Dispo: The patient is from: Home              Anticipated d/c is to: SNF.  Patient's wife changed her mind and wants SNF at this time.              Patient currently is not medically stable to d/c.   Difficult to place patient No     Infusions:    Scheduled Meds:  atorvastatin  10 mg Oral QHS   Chlorhexidine Gluconate Cloth  6 each Topical Daily   escitalopram  10 mg Oral Daily   finasteride  5 mg Oral Daily   insulin aspart  0-5 Units Subcutaneous QHS   insulin aspart  0-6 Units Subcutaneous TID WC   losartan  50 mg Oral Daily   melatonin  3 mg Oral QHS   memantine  10 mg Oral BID   mouth rinse  15 mL Mouth Rinse 4 times per day   polyethylene glycol  17 g Oral Daily   QUEtiapine  25 mg Oral QHS   sodium chloride  1 drop Both Eyes QHS   sodium chloride flush  3 mL Intravenous Q12H   tamsulosin  0.4 mg Oral BID    PRN meds: acetaminophen **OR** acetaminophen, guaiFENesin, hydrALAZINE, ipratropium-albuterol, metoprolol tartrate, ondansetron **OR** ondansetron (ZOFRAN) IV, mouth rinse, senna-docusate, traZODone   Antimicrobials: Anti-infectives (From admission, onward)    Start     Dose/Rate Route Frequency Ordered Stop   02/13/22 2200  cefTRIAXone (ROCEPHIN) 2 g in sodium chloride 0.9 % 100 mL IVPB        2 g 200 mL/hr over 30 Minutes Intravenous Every 24 hours 02/13/22 0039 02/17/22 0649   02/13/22 2200   azithromycin (ZITHROMAX) tablet 500 mg        500 mg Oral Daily at bedtime 02/13/22 0039 02/16/22 2206   02/12/22 2215  ceFEPIme (MAXIPIME) 2 g in sodium chloride 0.9 % 100 mL IVPB  Status:  Discontinued        2 g 200 mL/hr over 30 Minutes Intravenous  Once 02/12/22 2201 02/12/22 2201   02/12/22 2215  cefTRIAXone (ROCEPHIN) 2 g in sodium chloride 0.9 % 100 mL IVPB  Status:  Discontinued        2 g 200 mL/hr over 30 Minutes Intravenous Every 24 hours 02/12/22 2202 02/13/22 0040   02/12/22 2215  azithromycin (ZITHROMAX) 500 mg in  sodium chloride 0.9 % 250 mL IVPB        500 mg 250 mL/hr over 60 Minutes Intravenous  Once 02/12/22 2204 02/13/22 0053       Objective: Vitals:   02/22/22 0445 02/22/22 1230  BP: (!) 155/79 (!) 147/72  Pulse: 71 62  Resp: 20 17  Temp: 97.6 F (36.4 C) 97.8 F (36.6 C)  SpO2: 95% 97%    Intake/Output Summary (Last 24 hours) at 02/22/2022 1501 Last data filed at 02/22/2022 0900 Gross per 24 hour  Intake 240 ml  Output 1500 ml  Net -1260 ml   Filed Weights   02/20/22 0500 02/21/22 0500 02/22/22 0500  Weight: 81.4 kg 83.5 kg 83.5 kg   Weight change: 0 kg Body mass index is 29.71 kg/m.   Physical Exam: General exam: Pleasant elderly Caucasian male.  Not in physical distress Skin: No rashes, lesions or ulcers. HEENT: Atraumatic, normocephalic, no obvious bleeding Lungs: Diminished air entry in both bases CVS: Regular rate and rhythm, no murmur GI/Abd soft, nontender, nondistended, bowel sound present CNS: Somnolent, opens eyes and verbal command.  Able to follow commands Psychiatry: Mood appropriate Extremities: No edema, no calf tenderness  Data Review: I have personally reviewed the laboratory data and studies available.  F/u labs ordered Unresulted Labs (From admission, onward)    None       Signed, Terrilee Croak, MD [facility] 02/22/2022

## 2022-02-23 DIAGNOSIS — A419 Sepsis, unspecified organism: Secondary | ICD-10-CM | POA: Diagnosis not present

## 2022-02-23 DIAGNOSIS — J189 Pneumonia, unspecified organism: Secondary | ICD-10-CM | POA: Diagnosis not present

## 2022-02-23 LAB — GLUCOSE, CAPILLARY
Glucose-Capillary: 137 mg/dL — ABNORMAL HIGH (ref 70–99)
Glucose-Capillary: 166 mg/dL — ABNORMAL HIGH (ref 70–99)
Glucose-Capillary: 190 mg/dL — ABNORMAL HIGH (ref 70–99)
Glucose-Capillary: 178 mg/dL — ABNORMAL HIGH (ref 70–99)

## 2022-02-23 NOTE — Progress Notes (Signed)
Daily Progress Note   Patient Name: Francisco Leon       Date: 02/23/2022 DOB: October 27, 1931  Age: 88 y.o. MRN#: [id number] Attending Physician: Terrilee Croak, MD Primary Care Physician: Collene Leyden, MD Admit Date: 02/12/2022  Reason for Consultation/Follow-up: Establishing goals of care  Subjective: Patient is awake, alert, grandson at bedside, wife in the room, responds appropriately to questions  Length of Stay: 11  Current Medications: Scheduled Meds:   atorvastatin  10 mg Oral QHS   Chlorhexidine Gluconate Cloth  6 each Topical Daily   escitalopram  10 mg Oral Daily   finasteride  5 mg Oral Daily   insulin aspart  0-5 Units Subcutaneous QHS   insulin aspart  0-6 Units Subcutaneous TID WC   losartan  50 mg Oral Daily   melatonin  3 mg Oral QHS   memantine  10 mg Oral BID   mouth rinse  15 mL Mouth Rinse 4 times per day   polyethylene glycol  17 g Oral Daily   QUEtiapine  25 mg Oral QHS   sodium chloride  1 drop Both Eyes QHS   sodium chloride flush  3 mL Intravenous Q12H   tamsulosin  0.4 mg Oral BID    Continuous Infusions:    PRN Meds: acetaminophen **OR** acetaminophen, guaiFENesin, hydrALAZINE, ipratropium-albuterol, metoprolol tartrate, ondansetron **OR** ondansetron (ZOFRAN) IV, mouth rinse, senna-docusate, traZODone            Vital Signs: BP (!) 148/68 (BP Location: Right Arm)   Pulse 72   Temp 98.9 F (37.2 C) (Oral)   Resp 18   Ht '5\' 6"'$  (1.676 m)   Wt 83.5 kg   SpO2 93%   BMI 29.71 kg/m  SpO2: SpO2: 93 % O2 Device: O2 Device: Room Air O2 Flow Rate: O2 Flow Rate (L/min): 1 L/min Awake alert Responds appropriately Not appear to have labored breathing No edema   Intake/output summary:  Intake/Output Summary (Last 24 hours) at 02/23/2022 1135 Last data filed  at 02/22/2022 1700 Gross per 24 hour  Intake 360 ml  Output 800 ml  Net -440 ml    LBM: Last BM Date : 02/18/22 Baseline Weight: Weight: 83 kg Most recent weight: Weight: 83.5 kg       Palliative Assessment/Data:PPS 50%    Patient Active Problem List   Diagnosis Date Noted   Diabetes mellitus without complication ([place]) 92/42/6834   Hypertension 02/13/2022   Pure hypercholesterolemia 02/13/2022   Dementia ([place]) 02/13/2022   Hypokalemia 02/13/2022   Hematuria 02/13/2022   Sepsis due to pneumonia ([place]) 02/12/2022    Palliative Care Assessment & Plan   HPI: 88 y.o. male  with past medical history of hypertension, hyperlipidemia, type 2 diabetes, vascular dementia, hearing loss, and BPH admitted on 02/12/2022 with cough and hematuria. Patient diagnosed with sepsis d/t pna. Patient also with acute urinary retention - foley placed. Patient with new AKI. PMT consulted to discuss [place].   Assessment: Follow up today. Patient is awake alert, wife and grandson at bedside we discussed again about broad goals of care  Recommendations/Plan: Continue with full code/full scope care -    SNF rehab with palliative, then home with palliative, no long term  care at facility.  Discussed about some general measures regarding delirium prevention and also discussed about various options for SNF rehab that the family is pursuing.  Code Status: Full code  Care plan was discussed with wife, patient and grandson present in the room  Thank you for allowing the Palliative Medicine Team to assist in the care of this patient. Mod MDM *Please note that this is a verbal dictation therefore any spelling or grammatical errors are due to the "[place] [facility]" system interpretation.  Loistine Chance MD Palliative Medicine Team Team Phone # [phone]

## 2022-02-23 NOTE — Progress Notes (Signed)
CSW called [facility], as they do not manage this Pt. Will have to have the facility do the [place].

## 2022-02-23 NOTE — Progress Notes (Signed)
PROGRESS NOTE  Francisco Leon  DOB: 12-Jul-1931  PCP: Collene Leyden, MD WCB:[id number]  DOA: 02/12/2022  LOS: 11 days  Hospital Day: 12  Brief narrative: Francisco Leon is a 88 y.o. male with PMH significant for DM2, HTN, HLD, vascular dementia, BPH. /25, patient presented admitted on 10/25 from home with complaint of fever, cough as well as hematuria for 4 days.  In the ED chest x-ray showed left basilar pneumonia COVID/flu was negative.   There was concerns of acute urinary retention requiring coud atheter placement.   CT abdomen pelvis showed bilateral hydronephrosis with enlarged prostate Empirically started on Rocephin and azithromycin.   Admitted to [facility]   Urology was consulted   Subjective: Patient was seen and examined this morning.   Pleasant.  Propped up in bed.  Hard of hearing but otherwise mental status is stable and able to have a communication.  Wife at bedside.    Assessment and plan: Sepsis POA  Secondary to pneumonia and UTI Sepsis parameters improving on IV antibiotics See management of individual issues below Recent Labs  Lab 02/17/22 0518 02/18/22 0528 02/20/22 0657 02/21/22 0535 02/22/22 0721  WBC 14.1* 12.9* 11.6* 10.4 10.5    Left basilar pneumonia Presented with fever and cough.   Chest x-ray on admission showed left basilar pneumonia.  Tobis count is elevated.  Procalcitonin was 0.71.  Completed course of Rocephin and azithromycin.   Currently breathing on room air Continue bronchodilators.   UTI with hematuria Urine cultures showed >100,000 CFU per mL of group B strep. completed the course of IV Rocephin.   Gross hematuria bilateral hydronephrosis with severe prostatomegaly Urology was consulted.  Coud catheter is currently in place.  CBI on hold.  No evidence of hematuria recurrence today.  Catheter to remain at discharge.  Follow-up with urology as an outpatient continue finasteride 5 mg daily and Flomax 0.4 mg twice daily.   Acute  blood loss anemia Secondary to hematuria.  Hemoglobin at baseline more than 10.  Currently running close to 8.  Continue to monitor. Recent Labs    02/17/22 0518 02/18/22 0528 02/20/22 0657 02/21/22 0535 02/22/22 0721  HGB 10.5* 9.6* 9.4* 7.7* 8.3*  MCV 84.1 88.6 86.4 87.3 86.6    AKI Baseline creatinine is 1.1, creatinine trended up and peaked at 2.8.  Likely due to sepsis as well as urinary obstruction. Creatinine gradually improved to normal. Recent Labs    02/12/22 2210 02/13/22 0407 02/14/22 0532 02/15/22 0536 02/16/22 0856 02/17/22 0518 02/18/22 0528 02/20/22 0657 02/21/22 0535 02/22/22 0721  BUN 19 17 31* 37* 42* 57* 61* '23 18 15  '$ CREATININE 1.10 1.00 2.18* 1.84* 1.66* 2.49* 2.82* 0.86 0.83 0.83     Hypernatremia Sodium level normalized.   Recent Labs  Lab 02/17/22 0518 02/18/22 0528 02/20/22 0657 02/21/22 0535 02/22/22 0721  NA 139 142 146* 146* 142     Acute metabolic encephalopathy Underlying mild dementia CT of the head did not show any acute pathology.   PTA on Namenda 10 mg twice daily, Lexapro 10 mg daily.  Continue the same.   Currently also on Seroquel 25 mg nightly and melatonin 3 mg nightly.  Trazodone 50 mg nightly as needed  Type 2 diabetes mellitus A1c 6.5 on 02/12/2022 PTA on glimepiride 2 mg daily, metformin 500 mg daily. Currently on hold. Currently on sliding scale insulin with Accu-Cheks Recent Labs  Lab 02/22/22 1147 02/22/22 1612 02/22/22 2116 02/23/22 0736 02/23/22 1141  GLUCAP 144* 189* 177* 137*  166*    Essential hypertension PTA on losartan 50 mg daily, spironolactone 25 mg daily, HCTZ 25 mg daily, blood pressure meds were held initially because of AKI.  Creatinine normalized.  Blood pressure currently on 140s on losartan 50 mg daily.  Aldactone and HCTZ remain on hold.   Hyperlipidemia Continue Lipitor   Goals of care   Code Status: Full Code    Mobility: Encourage ambulation.  Skin assessment:      Nutritional status:  Body mass index is 29.71 kg/m.          Diet:  Diet Order             Diet regular Room service appropriate? Yes; Fluid consistency: Thin  Diet effective now                   DVT prophylaxis:  SCDs Start: 02/13/22 0047   Antimicrobials: Completed a course of antibiotics Fluid: None Consultants: None Family Communication: Wife at bedside  Status is: Inpatient  Continue in-hospital care because: Pending SNF Level of care: Med-Surg   Dispo: The patient is from: Home              Anticipated d/c is to: SNF.  Patient's wife changed her mind and wants SNF at this time.  Pending insurance authorization              Patient currently is not medically stable to d/c.   Difficult to place patient No     Infusions:    Scheduled Meds:  atorvastatin  10 mg Oral QHS   Chlorhexidine Gluconate Cloth  6 each Topical Daily   escitalopram  10 mg Oral Daily   finasteride  5 mg Oral Daily   insulin aspart  0-5 Units Subcutaneous QHS   insulin aspart  0-6 Units Subcutaneous TID WC   losartan  50 mg Oral Daily   melatonin  3 mg Oral QHS   memantine  10 mg Oral BID   mouth rinse  15 mL Mouth Rinse 4 times per day   polyethylene glycol  17 g Oral Daily   QUEtiapine  25 mg Oral QHS   sodium chloride  1 drop Both Eyes QHS   sodium chloride flush  3 mL Intravenous Q12H   tamsulosin  0.4 mg Oral BID    PRN meds: acetaminophen **OR** acetaminophen, guaiFENesin, hydrALAZINE, ipratropium-albuterol, metoprolol tartrate, ondansetron **OR** ondansetron (ZOFRAN) IV, mouth rinse, senna-docusate, traZODone   Antimicrobials: Anti-infectives (From admission, onward)    Start     Dose/Rate Route Frequency Ordered Stop   02/13/22 2200  cefTRIAXone (ROCEPHIN) 2 g in sodium chloride 0.9 % 100 mL IVPB        2 g 200 mL/hr over 30 Minutes Intravenous Every 24 hours 02/13/22 0039 02/17/22 0649   02/13/22 2200  azithromycin (ZITHROMAX) tablet 500 mg        500 mg  Oral Daily at bedtime 02/13/22 0039 02/16/22 2206   02/12/22 2215  ceFEPIme (MAXIPIME) 2 g in sodium chloride 0.9 % 100 mL IVPB  Status:  Discontinued        2 g 200 mL/hr over 30 Minutes Intravenous  Once 02/12/22 2201 02/12/22 2201   02/12/22 2215  cefTRIAXone (ROCEPHIN) 2 g in sodium chloride 0.9 % 100 mL IVPB  Status:  Discontinued        2 g 200 mL/hr over 30 Minutes Intravenous Every 24 hours 02/12/22 2202 02/13/22 0040   02/12/22 2215  azithromycin (ZITHROMAX) 500  mg in sodium chloride 0.9 % 250 mL IVPB        500 mg 250 mL/hr over 60 Minutes Intravenous  Once 02/12/22 2204 02/13/22 0053       Objective: Vitals:   02/23/22 0536 02/23/22 1258  BP: (!) 148/68 (!) 146/65  Pulse: 72 (!) 59  Resp: 18 17  Temp: 98.9 F (37.2 C) 98.1 F (36.7 C)  SpO2: 93% 93%    Intake/Output Summary (Last 24 hours) at 02/23/2022 1437 Last data filed at 02/23/2022 1300 Gross per 24 hour  Intake 720 ml  Output 800 ml  Net -80 ml    Filed Weights   02/20/22 0500 02/21/22 0500 02/22/22 0500  Weight: 81.4 kg 83.5 kg 83.5 kg   Weight change:  Body mass index is 29.71 kg/m.   Physical Exam: General exam: Pleasant elderly Caucasian male.  Not in physical distress.  No new symptoms Skin: No rashes, lesions or ulcers. HEENT: Atraumatic, normocephalic, no obvious bleeding Lungs: Diminished air entry in both bases CVS: Regular rate and rhythm, no murmur GI/Abd soft, nontender, nondistended, bowel sound present CNS: Awake, alert, oriented x2, hard of hearing.  Restlessness/agitation very infrequent now per wife Psychiatry: Mood appropriate Extremities: No edema, no calf tenderness  Data Review: I have personally reviewed the laboratory data and studies available.  F/u labs ordered Unresulted Labs (From admission, onward)    None       Signed, Terrilee Croak, MD [facility] 02/23/2022

## 2022-02-24 DIAGNOSIS — J189 Pneumonia, unspecified organism: Secondary | ICD-10-CM | POA: Diagnosis not present

## 2022-02-24 DIAGNOSIS — A419 Sepsis, unspecified organism: Secondary | ICD-10-CM | POA: Diagnosis not present

## 2022-02-24 LAB — GLUCOSE, CAPILLARY
Glucose-Capillary: 132 mg/dL — ABNORMAL HIGH (ref 70–99)
Glucose-Capillary: 149 mg/dL — ABNORMAL HIGH (ref 70–99)
Glucose-Capillary: 190 mg/dL — ABNORMAL HIGH (ref 70–99)
Glucose-Capillary: 159 mg/dL — ABNORMAL HIGH (ref 70–99)

## 2022-02-24 MED ORDER — BISACODYL 10 MG RE SUPP
10.0000 mg | Freq: Once | RECTAL | Status: AC
  Administered 2022-02-24: 10 mg via RECTAL
  Filled 2022-02-24: qty 1

## 2022-02-24 MED ORDER — ESCITALOPRAM OXALATE 10 MG PO TABS: 10.0000 mg | ORAL_TABLET | Freq: Every day | ORAL | Status: DC

## 2022-02-24 NOTE — TOC Progression Note (Signed)
Transition of Care [facility]) - Progression Note    Patient Details  Name: Francisco Leon MRN: [id number] Date of Birth: [birth year]  Transition of Care [facility]) CM/SW [place], LCSW Phone Number: 02/24/2022, 2:13 PM  Clinical Narrative:    Met with pt's spouse at bedside who now shares she and her family are open to SNF placement at [facility]. CSW contacted [facility] who are able to accept this pt. Pt lethargic today and not ready for D/C. Plan for possible discharge to SNF tomorrow.  Pt's spouse is now open to outpatient palliative care services and has agreed for a referral to [place].   Referral to [facility] for outpatient palliative care services has been made.    Expected Discharge Plan: [place] Barriers to Discharge: Continued Medical Work up  Expected Discharge Plan and Services Expected Discharge Plan: [place] In-house Referral: NA Discharge Planning Services: NA Post Acute Care Choice: [place] arrangements for the past 2 months: Single Family Home                 DME Arranged: N/A DME Agency: NA       HH Arranged: PT HH Agency: [place] Date [place]: 02/17/22 Time [place]: 1133 Representative spoke with at [place]: [place] ([place]) Interventions    Readmission Risk Interventions    02/21/2022   10:38 AM 02/17/2022   11:32 AM  Readmission Risk Prevention Plan  Transportation Screening Complete Complete  PCP or Specialist Appt within 5-7 Days Complete Complete  Home Care Screening Complete Complete  Medication Review (RN CM) Complete Complete

## 2022-02-24 NOTE — Progress Notes (Signed)
PT Cancellation Note  Patient Details Name: SANJAY BROADFOOT MRN: [id number] DOB: 12/28/1931   Cancelled Treatment:     Pt unable to tolerate due to lethargy.  Per chart review, pt will be D/C to SNF when medically stable.  Will continue to follow during his Acute stay.   Rica Koyanagi  PTA Acute  Rehabilitation Services Office M-F          [phone] Weekend pager [phone]

## 2022-02-24 NOTE — Progress Notes (Signed)
Daily Progress Note   Patient Name: Francisco Leon       Date: 02/23/2022 DOB: 1931-06-25  Age: 88 y.o. MRN#: [id number] Attending Physician: Terrilee Croak, MD Primary Care Physician: Collene Leyden, MD Admit Date: 02/12/2022  Reason for Consultation/Follow-up: Establishing goals of care  Subjective: Patient is less alert today, wife at bedside   Length of Stay: 11  Current Medications: Scheduled Meds:   atorvastatin  10 mg Oral QHS   Chlorhexidine Gluconate Cloth  6 each Topical Daily   escitalopram  10 mg Oral Daily   finasteride  5 mg Oral Daily   insulin aspart  0-5 Units Subcutaneous QHS   insulin aspart  0-6 Units Subcutaneous TID WC   losartan  50 mg Oral Daily   melatonin  3 mg Oral QHS   memantine  10 mg Oral BID   mouth rinse  15 mL Mouth Rinse 4 times per day   polyethylene glycol  17 g Oral Daily   QUEtiapine  25 mg Oral QHS   sodium chloride  1 drop Both Eyes QHS   sodium chloride flush  3 mL Intravenous Q12H   tamsulosin  0.4 mg Oral BID    Continuous Infusions:    PRN Meds: acetaminophen **OR** acetaminophen, guaiFENesin, hydrALAZINE, ipratropium-albuterol, metoprolol tartrate, ondansetron **OR** ondansetron (ZOFRAN) IV, mouth rinse, senna-docusate, traZODone            Vital Signs: BP (!) 148/68 (BP Location: Right Arm)   Pulse 72   Temp 98.9 F (37.2 C) (Oral)   Resp 18   Ht '5\' 6"'$  (1.676 m)   Wt 83.5 kg   SpO2 93%   BMI 29.71 kg/m  SpO2: SpO2: 93 % O2 Device: O2 Device: Room Air O2 Flow Rate: O2 Flow Rate (L/min): 1 L/min Less alert Responds appropriately Not appear to have labored breathing No edema Not agitated.   Intake/output summary:  Intake/Output Summary (Last 24 hours) at 02/23/2022 1135 Last data filed at 02/22/2022 1700 Gross per 24 hour   Intake 360 ml  Output 800 ml  Net -440 ml    LBM: Last BM Date : 02/18/22 Baseline Weight: Weight: 83 kg Most recent weight: Weight: 83.5 kg       Palliative Assessment/Data:PPS 50%    Patient Active Problem List   Diagnosis Date Noted   Diabetes mellitus without complication ([place]) 75/30/0511   Hypertension 02/13/2022   Pure hypercholesterolemia 02/13/2022   Dementia ([place]) 02/13/2022   Hypokalemia 02/13/2022   Hematuria 02/13/2022   Sepsis due to pneumonia [facility]) 02/12/2022    Palliative Care Assessment & Plan   HPI: 88 y.o. male  with past medical history of hypertension, hyperlipidemia, type 2 diabetes, vascular dementia, hearing loss, and BPH admitted on 02/12/2022 with cough and hematuria. Patient diagnosed with sepsis d/t pna. Patient also with acute urinary retention - foley placed. Patient with new AKI. PMT consulted to discuss [place].   Assessment: Follow up today. Patient is less alert, wife and grandson at bedside we discussed again about broad goals of care   Recommendations/Plan: Continue with full code/full scope care -    SNF rehab with palliative, then home with palliative, no long term care at facility.  Discussed about some general measures regarding delirium prevention and also discussed about various options for SNF rehab that the family is pursuing. Wife is thinking about [facility].  Constipation: Add Dulcolax rectal suppository, continue current bowel regimen.   Code Status: Full code  Care plan was discussed with wife,  Thank you for allowing the Palliative Medicine Team to assist in the care of this patient. Mod MDM *Please note that this is a verbal dictation therefore any spelling or grammatical errors are due to the "[place]" system interpretation.  Loistine Chance MD Palliative Medicine Team Team Phone # [phone]

## 2022-02-24 NOTE — Progress Notes (Signed)
[facility]) Hospital Liaison note:  Notified by Normajean Baxter  of request for [facility] Palliative Care services. Will continue to follow for disposition.  Please call with any outpatient palliative questions or concerns.  Thank you for the opportunity to participate in this patient's care.  Thank you, Lorelee Market, LPN [facility] Liaison [phone]

## 2022-02-24 NOTE — Progress Notes (Signed)
PROGRESS NOTE  Francisco Leon  DOB: 1931-08-29  PCP: Collene Leyden, MD IRC:[phone]  DOA: 02/12/2022  LOS: 12 days  Hospital Day: 13  Brief narrative: Francisco Leon is a 88 y.o. male with PMH significant for DM2, HTN, HLD, vascular dementia, BPH. /25, patient presented admitted on 10/25 from home with complaint of fever, cough as well as hematuria for 4 days.  In the ED chest x-ray showed left basilar pneumonia COVID/flu was negative.   There was concerns of acute urinary retention requiring coud atheter placement.   CT abdomen pelvis showed bilateral hydronephrosis with enlarged prostate Empirically started on Rocephin and azithromycin.   Admitted to [facility].   Urology was consulted   Subjective: Patient was seen and examined this morning.   Patient is lethargic this morning.  Utters few words with eyes closed.  Per his wife, he was awake for breakfast this morning  Assessment and plan: Sepsis POA  Secondary to pneumonia and UTI Sepsis parameters improving on IV antibiotics See management of individual issues below Recent Labs  Lab 02/18/22 0528 02/20/22 0657 02/21/22 0535 02/22/22 0721  WBC 12.9* 11.6* 10.4 10.5   Left basilar pneumonia Presented with fever and cough.   Chest x-ray on admission showed left basilar pneumonia.  Tobis count is elevated.  Procalcitonin was 0.71.  Completed course of Rocephin and azithromycin.   Currently breathing on room air Continue bronchodilators.   UTI with hematuria Urine cultures showed >100,000 CFU per mL of group B strep. completed the course of IV Rocephin.   Gross hematuria bilateral hydronephrosis with severe prostatomegaly Urology was consulted.  Coud catheter is currently in place.  CBI on hold.  No evidence of hematuria recurrence today.  Catheter to remain at discharge.  Follow-up with urology as an outpatient continue finasteride 5 mg daily and Flomax 0.4 mg twice daily.   Acute blood loss anemia Secondary to  hematuria.  Hemoglobin at baseline more than 10.  Currently running close to 8.  Continue to monitor. Recent Labs    02/17/22 0518 02/18/22 0528 02/20/22 0657 02/21/22 0535 02/22/22 0721  HGB 10.5* 9.6* 9.4* 7.7* 8.3*  MCV 84.1 88.6 86.4 87.3 86.6   AKI Baseline creatinine is 1.1, creatinine trended up and peaked at 2.8.  Likely due to sepsis as well as urinary obstruction. Creatinine gradually improved to normal. Recent Labs    02/12/22 2210 02/13/22 0407 02/14/22 0532 02/15/22 0536 02/16/22 0856 02/17/22 0518 02/18/22 0528 02/20/22 0657 02/21/22 0535 02/22/22 0721  BUN 19 17 31* 37* 42* 57* 61* '23 18 15  '$ CREATININE 1.10 1.00 2.18* 1.84* 1.66* 2.49* 2.82* 0.86 0.83 0.83    Hypernatremia Sodium level normalized.   Recent Labs  Lab 02/18/22 0528 02/20/22 0657 02/21/22 0535 02/22/22 0721  NA 142 146* 146* 510    Acute metabolic encephalopathy Underlying mild dementia On admission, patient was lethargic.  CT of the head did not show any acute pathology.   PTA on Namenda 10 mg twice daily, Lexapro 10 mg daily.  Continue the same.   Currently also on Seroquel 25 mg nightly and melatonin 3 mg nightly.  Trazodone 50 mg nightly as needed Not sure why he is lethargic today.  Vital signs normal.  I would switch Lexapro scheduled from morning to evening.  If no improvement in mental status next 1 to 2 hours, will repeat CT head.  Type 2 diabetes mellitus A1c 6.5 on 02/12/2022 PTA on glimepiride 2 mg daily, metformin 500 mg daily. Currently on  hold. Currently on sliding scale insulin with Accu-Cheks Recent Labs  Lab 02/23/22 1141 02/23/22 1726 02/23/22 2142 02/24/22 0814 02/24/22 1146  GLUCAP 166* 190* 178* 132* 149*   Essential hypertension PTA on losartan 50 mg daily, spironolactone 25 mg daily, HCTZ 25 mg daily, blood pressure meds were held initially because of AKI.  Creatinine normalized.  Blood pressure currently on 140s on losartan 50 mg daily.  Aldactone and  HCTZ remain on hold.   Hyperlipidemia Continue Lipitor   Goals of care   Code Status: Full Code    Mobility: Encourage ambulation.  Skin assessment:     Nutritional status:  Body mass index is 28.5 kg/m.          Diet:  Diet Order             Diet regular Room service appropriate? Yes; Fluid consistency: Thin  Diet effective now                   DVT prophylaxis:  SCDs Start: 02/13/22 0047   Antimicrobials: Completed a course of antibiotics Fluid: None Consultants: None Family Communication: Wife at bedside  Status is: Inpatient  Continue in-hospital care because: Pending SNF Level of care: Telemetry   Dispo: The patient is from: Home              Anticipated d/c is to: SNF.  Tentative plan to discharge today but unable to do so because of lethargy.  Hopefully tomorrow if mental status improves              Patient currently is not medically stable to d/c.   Difficult to place patient No     Infusions:    Scheduled Meds:  atorvastatin  10 mg Oral QHS   bisacodyl  10 mg Rectal Once   Chlorhexidine Gluconate Cloth  6 each Topical Daily   [START ON 02/25/2022] escitalopram  10 mg Oral QHS   finasteride  5 mg Oral Daily   insulin aspart  0-5 Units Subcutaneous QHS   insulin aspart  0-6 Units Subcutaneous TID WC   losartan  50 mg Oral Daily   melatonin  3 mg Oral QHS   memantine  10 mg Oral BID   mouth rinse  15 mL Mouth Rinse 4 times per day   polyethylene glycol  17 g Oral Daily   QUEtiapine  25 mg Oral QHS   sodium chloride  1 drop Both Eyes QHS   sodium chloride flush  3 mL Intravenous Q12H   tamsulosin  0.4 mg Oral BID    PRN meds: acetaminophen **OR** acetaminophen, guaiFENesin, hydrALAZINE, ipratropium-albuterol, metoprolol tartrate, ondansetron **OR** ondansetron (ZOFRAN) IV, mouth rinse, senna-docusate, traZODone   Antimicrobials: Anti-infectives (From admission, onward)    Start     Dose/Rate Route Frequency Ordered Stop    02/13/22 2200  cefTRIAXone (ROCEPHIN) 2 g in sodium chloride 0.9 % 100 mL IVPB        2 g 200 mL/hr over 30 Minutes Intravenous Every 24 hours 02/13/22 0039 02/17/22 0649   02/13/22 2200  azithromycin (ZITHROMAX) tablet 500 mg        500 mg Oral Daily at bedtime 02/13/22 0039 02/16/22 2206   02/12/22 2215  ceFEPIme (MAXIPIME) 2 g in sodium chloride 0.9 % 100 mL IVPB  Status:  Discontinued        2 g 200 mL/hr over 30 Minutes Intravenous  Once 02/12/22 2201 02/12/22 2201   02/12/22 2215  cefTRIAXone (ROCEPHIN) 2  g in sodium chloride 0.9 % 100 mL IVPB  Status:  Discontinued        2 g 200 mL/hr over 30 Minutes Intravenous Every 24 hours 02/12/22 2202 02/13/22 0040   02/12/22 2215  azithromycin (ZITHROMAX) 500 mg in sodium chloride 0.9 % 250 mL IVPB        500 mg 250 mL/hr over 60 Minutes Intravenous  Once 02/12/22 2204 02/13/22 0053       Objective: Vitals:   02/24/22 1145 02/24/22 1314  BP: 134/60 (!) 145/62  Pulse: (!) 58 66  Resp: 16 16  Temp: 98 F (36.7 C) (!) 97.5 F (36.4 C)  SpO2: 92% 93%    Intake/Output Summary (Last 24 hours) at 02/24/2022 1400 Last data filed at 02/24/2022 0930 Gross per 24 hour  Intake 750 ml  Output 1175 ml  Net -425 ml   Filed Weights   02/21/22 0500 02/22/22 0500 02/24/22 0500  Weight: 83.5 kg 83.5 kg 80.1 kg   Weight change:  Body mass index is 28.5 kg/m.   Physical Exam: General exam: Pleasant elderly Caucasian male.  Not in physical distress.  No new symptoms Skin: No rashes, lesions or ulcers. HEENT: Atraumatic, normocephalic, no obvious bleeding Lungs: Diminished air entry in both bases CVS: Regular rate and rhythm, no murmur GI/Abd soft, nontender, nondistended, bowel sound present CNS: Hard to arouse this morning. Psychiatry: Mood appropriate Extremities: No edema, no calf tenderness  Data Review: I have personally reviewed the laboratory data and studies available.  F/u labs ordered Unresulted Labs (From admission,  onward)    None       Signed, Terrilee Croak, MD [facility] 02/24/2022

## 2022-02-24 NOTE — Plan of Care (Signed)
  Problem: Activity: Goal: Ability to tolerate increased activity will improve Outcome: Progressing   Problem: Clinical Measurements: Goal: Ability to maintain a body temperature in the normal range will improve Outcome: Progressing   Problem: Respiratory: Goal: Ability to maintain a clear airway will improve Outcome: Progressing   Problem: Coping: Goal: Ability to adjust to condition or change in health will improve Outcome: Progressing   Problem: Skin Integrity: Goal: Risk for impaired skin integrity will decrease Outcome: Progressing   Problem: Education: Goal: Knowledge of General Education information will improve Description: Including pain rating scale, medication(s)/side effects and non-pharmacologic comfort measures Outcome: Progressing   Problem: Clinical Measurements: Goal: Respiratory complications will improve Outcome: Progressing   Problem: Nutrition: Goal: Adequate nutrition will be maintained Outcome: Progressing   Problem: Safety: Goal: Ability to remain free from injury will improve Outcome: Progressing   Problem: Skin Integrity: Goal: Risk for impaired skin integrity will decrease Outcome: Progressing

## 2022-02-24 NOTE — Progress Notes (Signed)
Patient is extremely lethargic this AM, requiring excessive stimulation to respond. Pt has been non-verbal this shift and will not open his eyes. Patient reflexively chews ice that his wife put in his mouth, and swallowed his pills crushed in bananas. Patient's wife told this RN that normally he is awake and talking by 8 AM. VS were WNL when assessed by NT. Patient's MEWS turned yellow d/t change in LOC. Dr Pietro Cassis notified of changes and told RN he will round on patient shortly. This RN will continue to carefully monitor patient for changes in condition.

## 2022-02-24 NOTE — Progress Notes (Signed)
Patient continues to be lethargic, he doesn't open eyes but will open mouth for family to give ice chips and fruit. Wife states that the closing of the eyes isnt new, he does that a lot at home.  Suppository given.

## 2022-02-24 NOTE — Care Management Important Message (Signed)
Important Message  Patient Details IM Letter placed in Patients room. Name: Francisco Leon MRN: [id number] Date of Birth: Sep 11, 1931   Medicare Important Message Given:  Yes     Kerin Salen 02/24/2022, 11:50 AM

## 2022-02-25 DIAGNOSIS — D62 Acute posthemorrhagic anemia: Secondary | ICD-10-CM | POA: Diagnosis not present

## 2022-02-25 DIAGNOSIS — I69891 Dysphagia following other cerebrovascular disease: Secondary | ICD-10-CM | POA: Diagnosis not present

## 2022-02-25 DIAGNOSIS — A419 Sepsis, unspecified organism: Secondary | ICD-10-CM | POA: Diagnosis not present

## 2022-02-25 DIAGNOSIS — N139 Obstructive and reflux uropathy, unspecified: Secondary | ICD-10-CM | POA: Diagnosis not present

## 2022-02-25 DIAGNOSIS — R2689 Other abnormalities of gait and mobility: Secondary | ICD-10-CM | POA: Diagnosis not present

## 2022-02-25 DIAGNOSIS — J189 Pneumonia, unspecified organism: Secondary | ICD-10-CM | POA: Diagnosis not present

## 2022-02-25 DIAGNOSIS — N1330 Unspecified hydronephrosis: Secondary | ICD-10-CM | POA: Diagnosis not present

## 2022-02-25 DIAGNOSIS — Z7401 Bed confinement status: Secondary | ICD-10-CM | POA: Diagnosis not present

## 2022-02-25 DIAGNOSIS — I10 Essential (primary) hypertension: Secondary | ICD-10-CM | POA: Diagnosis not present

## 2022-02-25 DIAGNOSIS — N400 Enlarged prostate without lower urinary tract symptoms: Secondary | ICD-10-CM | POA: Diagnosis not present

## 2022-02-25 DIAGNOSIS — F0390 Unspecified dementia without behavioral disturbance: Secondary | ICD-10-CM | POA: Diagnosis not present

## 2022-02-25 DIAGNOSIS — I69828 Other speech and language deficits following other cerebrovascular disease: Secondary | ICD-10-CM | POA: Diagnosis not present

## 2022-02-25 DIAGNOSIS — E1165 Type 2 diabetes mellitus with hyperglycemia: Secondary | ICD-10-CM | POA: Diagnosis not present

## 2022-02-25 DIAGNOSIS — M6281 Muscle weakness (generalized): Secondary | ICD-10-CM | POA: Diagnosis not present

## 2022-02-25 DIAGNOSIS — E1140 Type 2 diabetes mellitus with diabetic neuropathy, unspecified: Secondary | ICD-10-CM | POA: Diagnosis not present

## 2022-02-25 DIAGNOSIS — J159 Unspecified bacterial pneumonia: Secondary | ICD-10-CM | POA: Diagnosis not present

## 2022-02-25 DIAGNOSIS — R339 Retention of urine, unspecified: Secondary | ICD-10-CM | POA: Diagnosis not present

## 2022-02-25 DIAGNOSIS — R2681 Unsteadiness on feet: Secondary | ICD-10-CM | POA: Diagnosis not present

## 2022-02-25 DIAGNOSIS — R1312 Dysphagia, oropharyngeal phase: Secondary | ICD-10-CM | POA: Diagnosis not present

## 2022-02-25 DIAGNOSIS — N390 Urinary tract infection, site not specified: Secondary | ICD-10-CM | POA: Diagnosis not present

## 2022-02-25 DIAGNOSIS — F01511 Vascular dementia, unspecified severity, with agitation: Secondary | ICD-10-CM | POA: Diagnosis not present

## 2022-02-25 DIAGNOSIS — Z9181 History of falling: Secondary | ICD-10-CM | POA: Diagnosis not present

## 2022-02-25 DIAGNOSIS — E118 Type 2 diabetes mellitus with unspecified complications: Secondary | ICD-10-CM | POA: Diagnosis not present

## 2022-02-25 DIAGNOSIS — J309 Allergic rhinitis, unspecified: Secondary | ICD-10-CM | POA: Diagnosis not present

## 2022-02-25 DIAGNOSIS — R4182 Altered mental status, unspecified: Secondary | ICD-10-CM | POA: Diagnosis not present

## 2022-02-25 DIAGNOSIS — R41841 Cognitive communication deficit: Secondary | ICD-10-CM | POA: Diagnosis not present

## 2022-02-25 DIAGNOSIS — E785 Hyperlipidemia, unspecified: Secondary | ICD-10-CM | POA: Diagnosis not present

## 2022-02-25 DIAGNOSIS — N401 Enlarged prostate with lower urinary tract symptoms: Secondary | ICD-10-CM | POA: Diagnosis not present

## 2022-02-25 DIAGNOSIS — F0150 Vascular dementia without behavioral disturbance: Secondary | ICD-10-CM | POA: Diagnosis not present

## 2022-02-25 LAB — GLUCOSE, CAPILLARY
Glucose-Capillary: 141 mg/dL — ABNORMAL HIGH (ref 70–99)
Glucose-Capillary: 199 mg/dL — ABNORMAL HIGH (ref 70–99)

## 2022-02-25 MED ORDER — TRAZODONE HCL 50 MG PO TABS: 50.0000 mg | ORAL_TABLET | Freq: Every evening | ORAL | Status: AC | PRN

## 2022-02-25 MED ORDER — SENNOSIDES-DOCUSATE SODIUM 8.6-50 MG PO TABS: 1.0000 | ORAL_TABLET | Freq: Every evening | ORAL | Status: DC | PRN

## 2022-02-25 MED ORDER — IPRATROPIUM-ALBUTEROL 0.5-2.5 (3) MG/3ML IN SOLN: 3.0000 mL | RESPIRATORY_TRACT | Status: DC | PRN

## 2022-02-25 MED ORDER — INSULIN ASPART 100 UNIT/ML IJ SOLN: 0.0000 [IU] | Freq: Every day | INTRAMUSCULAR | 11 refills | Status: AC

## 2022-02-25 MED ORDER — QUETIAPINE FUMARATE 25 MG PO TABS: 25.0000 mg | ORAL_TABLET | Freq: Every day | ORAL | Status: AC

## 2022-02-25 MED ORDER — INSULIN ASPART 100 UNIT/ML IJ SOLN: 0.0000 [IU] | Freq: Three times a day (TID) | INTRAMUSCULAR | 11 refills | Status: AC

## 2022-02-25 MED ORDER — MELATONIN 3 MG PO TABS: 3.0000 mg | ORAL_TABLET | Freq: Every day | ORAL | 0 refills | Status: AC

## 2022-02-25 MED ORDER — ESCITALOPRAM OXALATE 10 MG PO TABS: 10.0000 mg | ORAL_TABLET | Freq: Every day | ORAL | Status: AC

## 2022-02-25 NOTE — Discharge Summary (Addendum)
Physician Discharge Summary  Francisco Leon SNK:[id number] DOB: 1931/06/15 DOA: 02/12/2022  PCP: Collene Leyden, MD  Admit date: 02/12/2022 Discharge date: 02/25/2022  Admitted From: home Discharge disposition: SNF  Recommendations at discharge:  Hold glimepiride, Aldactone, HCTZ Encourage participation with physical therapy Discharged with Foley catheter.  To follow-up with urology as an outpatient.   Brief narrative: Francisco Leon is a 88 y.o. male with PMH significant for DM2, HTN, HLD, vascular dementia, BPH. 10/25, patient presented admitted on 10/25 from home with complaint of fever, cough as well as hematuria for 4 days.  In the ED chest x-ray showed left basilar pneumonia COVID/flu was negative.   There was concerns of acute urinary retention requiring coud catheter placement.   CT abdomen pelvis showed bilateral hydronephrosis with enlarged prostate Empirically started on Rocephin and azithromycin.   Admitted to [facility].   Urology was consulted   Subjective: Patient was seen and examined this morning.   Propped up in bed.  Alert, awake, able to answer orientation questions. Wife at bedside.  Hospital course Sepsis POA  Secondary to pneumonia and UTI Sepsis parameters improved with IV antibiotics See management of individual issues below Recent Labs  Lab 02/20/22 0657 02/21/22 0535 02/22/22 0721  WBC 11.6* 10.4 10.5   Left basilar pneumonia Presented with fever and cough.   Chest x-ray on admission showed left basilar pneumonia.  Tobis count is elevated.  Procalcitonin was 0.71.  Completed course of Rocephin and azithromycin.   Currently breathing on room air Continue bronchodilators.   UTI with hematuria Urine cultures showed >100,000 CFU per mL of group B strep. Completed the course of IV Rocephin.   Gross hematuria Bilateral hydronephrosis with severe prostatomegaly Urology was consulted.  Coud catheter is currently in place.  CBI on hold.  No  evidence of hematuria recurrence.  Foley catheter to remain at discharge.  Follow-up with urology as an outpatient continue finasteride 5 mg daily and Flomax 0.4 mg twice daily.   Acute blood loss anemia Secondary to hematuria.  Hemoglobin at baseline more than 10.  Currently running close to 8.  Continue to monitor. Recent Labs    02/17/22 0518 02/18/22 0528 02/20/22 0657 02/21/22 0535 02/22/22 0721  HGB 10.5* 9.6* 9.4* 7.7* 8.3*  MCV 84.1 88.6 86.4 87.3 86.6   AKI Baseline creatinine is 1.1, creatinine trended up and peaked at 2.8.  Likely due to sepsis as well as urinary obstruction. Creatinine gradually improved to normal. Recent Labs    02/12/22 2210 02/13/22 0407 02/14/22 0532 02/15/22 0536 02/16/22 0856 02/17/22 0518 02/18/22 0528 02/20/22 0657 02/21/22 0535 02/22/22 0721  BUN 19 17 31* 37* 42* 57* 61* '23 18 15  '$ CREATININE 1.10 1.00 2.18* 1.84* 1.66* 2.49* 2.82* 0.86 0.83 0.83    Hypernatremia Sodium level normalized.   Recent Labs  Lab 02/20/22 0657 02/21/22 0535 02/22/22 0721  NA 146* 146* 937    Acute metabolic encephalopathy Underlying mild dementia On admission, patient was lethargic.  CT of the head did not show any acute pathology.   PTA on Namenda 10 mg twice daily, Lexapro 10 mg daily.  Continue the same.   Currently also on Seroquel 25 mg nightly and melatonin 3 mg nightly.  Trazodone 50 mg nightly as needed Continue the same at the rehab  Type 2 diabetes mellitus A1c 6.5 on 02/12/2022 PTA on glimepiride 2 mg daily, metformin 500 mg daily.  Without these meds, blood sugar level has been running close to 150s. Resume metformin  at discharge.  Keep (on hold. Currently on sliding scale insulin with Accu-Cheks Recent Labs  Lab 02/24/22 0814 02/24/22 1146 02/24/22 1653 02/24/22 2142 02/25/22 0839  GLUCAP 132* 149* 190* 159* 141*   Essential hypertension PTA on losartan 50 mg daily, spironolactone 25 mg daily, HCTZ 25 mg daily, blood pressure  meds were held initially because of AKI.  Creatinine normalized.  Blood pressure currently on 140s on losartan 50 mg daily.  Aldactone and HCTZ remain on hold.  With his limited oral intake and high risk of dehydration and AKI, I would continue to hold Aldactone and HCTZ at discharge.   Hyperlipidemia Continue Lipitor   Wounds:  -    Discharge Exam:   Vitals:   02/24/22 1656 02/24/22 2126 02/25/22 0457 02/25/22 0921  BP: (!) 164/76 (!) 156/66 (!) 162/73 138/63  Pulse: 92 90 71   Resp: '17 20 18 15  '$ Temp: 98.2 F (36.8 C) 98.4 F (36.9 C) 98.1 F (36.7 C)   TempSrc: Oral Oral Oral   SpO2: 91% 91% 92%   Weight:   79.4 kg   Height:        Body mass index is 28.25 kg/m.  General exam: Pleasant elderly Caucasian male.  Not in physical distress.  No new symptoms Skin: No rashes, lesions or ulcers. HEENT: Atraumatic, normocephalic, no obvious bleeding Lungs: Diminished air entry in both bases CVS: Regular rate and rhythm, no murmur GI/Abd soft, nontender, nondistended, bowel sound present CNS: Alert, awake, hard of hearing but able to answer orientation questions.   Psychiatry: Mood appropriate Extremities: No edema, no calf tenderness  Follow ups:    Contact information for follow-up providers     [facility] Follow up.   Why: [facility] is to provide home health physical therapy        Collene Leyden, MD Follow up.   Specialty: Family Medicine Contact information: [address] [place] [place] [id number] [phone]              Contact information for after-discharge care     Destination     [facility] .   Service: Skilled Nursing Contact information: [address] [place] [phone]                     Discharge Instructions:   Discharge Instructions     Call MD for:  difficulty breathing, headache or visual disturbances   Complete by: As  directed    Call MD for:  extreme fatigue   Complete by: As directed    Call MD for:  hives   Complete by: As directed    Call MD for:  persistant dizziness or light-headedness   Complete by: As directed    Call MD for:  persistant nausea and vomiting   Complete by: As directed    Call MD for:  severe uncontrolled pain   Complete by: As directed    Call MD for:  temperature >100.4   Complete by: As directed    Diet general   Complete by: As directed    Discharge instructions   Complete by: As directed    Recommendations at discharge:   Hold glimepiride, Aldactone, HCTZ  Encourage participation with physical therapy  Discharged with Foley catheter.  To follow-up with urology as an outpatient.  Discharge instructions for diabetes mellitus: Check blood sugar 3 times a day and bedtime at home. If blood sugar  running above 200 or less than 70 please call your MD to adjust insulin. If you notice signs and symptoms of hypoglycemia (low blood sugar) like jitteriness, confusion, thirst, tremor and sweating, please check blood sugar, drink sugary drink/biscuits/sweets to increase sugar level and call MD or return to ER.    General discharge instructions: Follow with Primary MD Collene Leyden, MD in 7 days  Please request your PCP  to go over your hospital tests, procedures, radiology results at the follow up. Please get your medicines reviewed and adjusted.  Your PCP may decide to repeat certain labs or tests as needed. Do not drive, operate heavy machinery, perform activities at heights, swimming or participation in water activities or provide baby sitting services if your were admitted for syncope or siezures until you have seen by Primary MD or a Neurologist and advised to do so again. [place] Controlled Substance Reporting System database was reviewed. Do not drive, operate heavy machinery, perform activities at heights, swim, participate in water activities or provide baby-sitting  services while on medications for pain, sleep and mood until your outpatient physician has reevaluated you and advised to do so again.  You are strongly recommended to comply with the dose, frequency and duration of prescribed medications. Activity: As tolerated with Full fall precautions use walker/cane & assistance as needed Avoid using any recreational substances like cigarette, tobacco, alcohol, or non-prescribed drug. If you experience worsening of your admission symptoms, develop shortness of breath, life threatening emergency, suicidal or homicidal thoughts you must seek medical attention immediately by calling 911 or calling your MD immediately  if symptoms less severe. You must read complete instructions/literature along with all the possible adverse reactions/side effects for all the medicines you take and that have been prescribed to you. Take any new medicine only after you have completely understood and accepted all the possible adverse reactions/side effects.  Wear Seat belts while driving. You were cared for by a hospitalist during your hospital stay. If you have any questions about your discharge medications or the care you received while you were in the hospital after you are discharged, you can call the unit and ask to speak with the hospitalist or the covering physician. Once you are discharged, your primary care physician will handle any further medical issues. Please note that NO REFILLS for any discharge medications will be authorized once you are discharged, as it is imperative that you return to your primary care physician (or establish a relationship with a primary care physician if you do not have one).   Increase activity slowly   Complete by: As directed        Discharge Medications:   Allergies as of 02/25/2022       Reactions   Dutasteride Other (See Comments)   Gynecomastia   Lisinopril Other (See Comments)   Dry cough   Sulfa Antibiotics Nausea And Vomiting         Medication List     STOP taking these medications    glimepiride 2 MG tablet Commonly known as: AMARYL   spironolactone-hydrochlorothiazide 25-25 MG tablet Commonly known as: ALDACTAZIDE       TAKE these medications    atorvastatin 10 MG tablet Commonly known as: LIPITOR Take 10 mg by mouth at bedtime.   carboxymethylcellulose 0.5 % Soln Commonly known as: REFRESH PLUS 1 drop 3 (three) times daily as needed (dry eyes).   escitalopram 10 MG tablet Commonly known as: LEXAPRO Take 1 tablet (10 mg total) by  mouth at bedtime. What changed: when to take this   finasteride 5 MG tablet Commonly known as: PROSCAR Take 1 tablet (5 mg total) by mouth daily. For massive prostate and urinary bleeding.   freestyle lancets daily.   FREESTYLE LITE test strip Generic drug: glucose blood USE 1 STRIP TO TEST BLOOD SUGAR DAILY   FREESTYLE LITE test strip Generic drug: glucose blood 1 each daily.   insulin aspart 100 UNIT/ML injection Commonly known as: novoLOG Inject 0-6 Units into the skin 3 (three) times daily with meals.   insulin aspart 100 UNIT/ML injection Commonly known as: novoLOG Inject 0-5 Units into the skin at bedtime.   ipratropium-albuterol 0.5-2.5 (3) MG/3ML Soln Commonly known as: DUONEB Take 3 mLs by nebulization every 4 (four) hours as needed.   levocetirizine 5 MG tablet Commonly known as: XYZAL Take 1 tablet by mouth every evening.   losartan 50 MG tablet Commonly known as: COZAAR Take 50 mg by mouth at bedtime.   melatonin 3 MG Tabs tablet Take 1 tablet (3 mg total) by mouth at bedtime.   memantine tablet pack Commonly known as: NAMENDA TITRATION PACK As directed on pack   metFORMIN 500 MG tablet Commonly known as: GLUCOPHAGE Take 500 mg by mouth daily with supper.   Mucinex Maximum Strength 1200 MG Tb12 Generic drug: Guaifenesin Take 1,200 mg by mouth daily.   polyethylene glycol 17 g packet Commonly known as: MIRALAX /  GLYCOLAX Take 17 g by mouth daily.   QUEtiapine 25 MG tablet Commonly known as: SEROQUEL Take 1 tablet (25 mg total) by mouth at bedtime.   senna-docusate 8.6-50 MG tablet Commonly known as: Senokot-S Take 1 tablet by mouth at bedtime as needed for moderate constipation.   sodium chloride 2 % ophthalmic solution Commonly known as: MURO 128 Place 1 drop into both eyes at bedtime.   tamsulosin 0.4 MG Caps capsule Commonly known as: FLOMAX 0.4 mg in the morning and at bedtime.   traZODone 50 MG tablet Commonly known as: DESYREL Take 1 tablet (50 mg total) by mouth at bedtime as needed for sleep.         The results of significant diagnostics from this hospitalization (including imaging, microbiology, ancillary and laboratory) are listed below for reference.    Procedures and Diagnostic Studies:   DG Chest Port 1 View  Result Date: 02/12/2022 CLINICAL DATA:  Hematuria and possible sepsis, initial encounter EXAM: PORTABLE CHEST 1 VIEW COMPARISON:  11/25/2010 FINDINGS: Cardiac shadow is within normal limits. Left basilar airspace opacity is noted with small effusion. No bony abnormality is noted. IMPRESSION: Left basilar airspace opacity with small effusion. Electronically Signed   By: Inez Catalina M.D.   On: 02/12/2022 22:32     Labs:   Basic Metabolic Panel: Recent Labs  Lab 02/20/22 0657 02/21/22 0535 02/22/22 0721  NA 146* 146* 142  K 3.3* 3.6 3.5  CL 121* 119* 115*  CO2 19* 23 24  GLUCOSE 172* 154* 157*  BUN '23 18 15  '$ CREATININE 0.86 0.83 0.83  CALCIUM 9.4 8.9 9.3  MG 1.8 1.7  --    GFR Estimated Creatinine Clearance: 60.9 mL/min (by C-G formula based on SCr of 0.83 mg/dL). Liver Function Tests: No results for input(s): "AST", "ALT", "ALKPHOS", "BILITOT", "PROT", "ALBUMIN" in the last 168 hours. No results for input(s): "LIPASE", "AMYLASE" in the last 168 hours. Recent Labs  Lab 02/19/22 1014  AMMONIA <10   Coagulation profile No results for  input(s): "INR", "PROTIME" in  the last 168 hours.  CBC: Recent Labs  Lab 02/20/22 0657 02/21/22 0535 02/22/22 0721  WBC 11.6* 10.4 10.5  HGB 9.4* 7.7* 8.3*  HCT 28.6* 24.1* 25.9*  MCV 86.4 87.3 86.6  PLT 161 137* 152   Cardiac Enzymes: No results for input(s): "CKTOTAL", "CKMB", "CKMBINDEX", "TROPONINI" in the last 168 hours. BNP: Invalid input(s): "POCBNP" CBG: Recent Labs  Lab 02/24/22 0814 02/24/22 1146 02/24/22 1653 02/24/22 2142 02/25/22 0839  GLUCAP 132* 149* 190* 159* 141*   D-Dimer No results for input(s): "DDIMER" in the last 72 hours. Hgb A1c No results for input(s): "HGBA1C" in the last 72 hours. Lipid Profile No results for input(s): "CHOL", "HDL", "LDLCALC", "TRIG", "CHOLHDL", "LDLDIRECT" in the last 72 hours. Thyroid function studies No results for input(s): "TSH", "T4TOTAL", "T3FREE", "THYROIDAB" in the last 72 hours.  Invalid input(s): "FREET3" Anemia work up No results for input(s): "VITAMINB12", "FOLATE", "FERRITIN", "TIBC", "IRON", "RETICCTPCT" in the last 72 hours. Microbiology No results found for this or any previous visit (from the past 240 hour(s)).  Time coordinating discharge: 35 minutes  Signed: Richetta Cubillos  [facility] 02/25/2022, 10:46 AM

## 2022-02-25 NOTE — TOC Transition Note (Addendum)
Transition of Care [facility]) - CM/SW Discharge Note   Patient Details  Name: Francisco Leon MRN: [id number] Date of Birth: [birth year]  Transition of Care [facility]) CM/SW Contact:  Vassie Moselle, LCSW Phone Number: 02/25/2022, 11:02 AM   Clinical Narrative:    Pt is to transfer to [facility] for SNF placement. Pt will be going to room 110. RN to call report to [phone]. Spoke w/ pt's wife and confirmed discharge plans. Pt will be transported to facility via [facility]. [facility] called for transport at 11:37am.   Final next level of care: Skilled Nursing Facility Barriers to Discharge: Barriers Resolved   Patient Goals and CMS Choice Patient states their goals for this hospitalization and ongoing recovery are:: Per wife: For pt to return home CMS Medicare.gov Compare Post Acute Care list provided to:: Patient Represenative (must comment) (Spouse) Choice offered to / list presented to : Spouse  Discharge Placement PASRR number recieved: 02/19/22            Patient [facility] bed at: [facility] Patient to be transferred to facility by: [facility] Name of family member notified: Kealii Thueson Patient and family notified of of transfer: 02/25/22  Discharge Plan and Services In-house Referral: NA Discharge Planning Services: NA Post Acute Care Choice: Home Health          DME Arranged: N/A DME Agency: NA       HH Arranged: PT HH Agency: [facility] Date [place]: 02/17/22 Time [place]: 2330 Representative spoke with at [place]: [place] ([place]) Interventions     Readmission Risk Interventions    02/21/2022   10:38 AM 02/17/2022   11:32 AM  Readmission Risk Prevention Plan  Transportation Screening Complete Complete  PCP or Specialist Appt within 5-7 Days Complete Complete  Home Care Screening Complete Complete  Medication Review (RN CM) Complete Complete

## 2022-02-25 NOTE — Progress Notes (Signed)
Patient discharged to [facility]. Report called to [facility], Therapist, sports. Wife at the bedside and is aware of transfer. She confirmed patient has all of his belongings. IV removed. [facility] provided transportation

## 2022-02-26 DIAGNOSIS — E785 Hyperlipidemia, unspecified: Secondary | ICD-10-CM | POA: Diagnosis not present

## 2022-02-26 DIAGNOSIS — N400 Enlarged prostate without lower urinary tract symptoms: Secondary | ICD-10-CM | POA: Diagnosis not present

## 2022-02-26 DIAGNOSIS — N1330 Unspecified hydronephrosis: Secondary | ICD-10-CM | POA: Diagnosis not present

## 2022-02-26 DIAGNOSIS — J159 Unspecified bacterial pneumonia: Secondary | ICD-10-CM | POA: Diagnosis not present

## 2022-02-26 DIAGNOSIS — E1140 Type 2 diabetes mellitus with diabetic neuropathy, unspecified: Secondary | ICD-10-CM | POA: Diagnosis not present

## 2022-02-26 DIAGNOSIS — N139 Obstructive and reflux uropathy, unspecified: Secondary | ICD-10-CM | POA: Diagnosis not present

## 2022-02-27 DIAGNOSIS — N139 Obstructive and reflux uropathy, unspecified: Secondary | ICD-10-CM | POA: Diagnosis not present

## 2022-02-27 DIAGNOSIS — N390 Urinary tract infection, site not specified: Secondary | ICD-10-CM | POA: Diagnosis not present

## 2022-02-27 DIAGNOSIS — A419 Sepsis, unspecified organism: Secondary | ICD-10-CM | POA: Diagnosis not present

## 2022-02-27 DIAGNOSIS — N401 Enlarged prostate with lower urinary tract symptoms: Secondary | ICD-10-CM | POA: Diagnosis not present

## 2022-02-27 DIAGNOSIS — N400 Enlarged prostate without lower urinary tract symptoms: Secondary | ICD-10-CM | POA: Diagnosis not present

## 2022-02-27 DIAGNOSIS — J309 Allergic rhinitis, unspecified: Secondary | ICD-10-CM | POA: Diagnosis not present

## 2022-02-27 DIAGNOSIS — N1330 Unspecified hydronephrosis: Secondary | ICD-10-CM | POA: Diagnosis not present

## 2022-02-27 DIAGNOSIS — E118 Type 2 diabetes mellitus with unspecified complications: Secondary | ICD-10-CM | POA: Diagnosis not present

## 2022-02-27 DIAGNOSIS — J189 Pneumonia, unspecified organism: Secondary | ICD-10-CM | POA: Diagnosis not present

## 2022-02-27 DIAGNOSIS — E785 Hyperlipidemia, unspecified: Secondary | ICD-10-CM | POA: Diagnosis not present

## 2022-02-27 DIAGNOSIS — F0150 Vascular dementia without behavioral disturbance: Secondary | ICD-10-CM | POA: Diagnosis not present

## 2022-02-27 DIAGNOSIS — I10 Essential (primary) hypertension: Secondary | ICD-10-CM | POA: Diagnosis not present

## 2022-02-27 DIAGNOSIS — J159 Unspecified bacterial pneumonia: Secondary | ICD-10-CM | POA: Diagnosis not present

## 2022-03-03 ENCOUNTER — Telehealth: Payer: Self-pay | Admitting: Neurology

## 2022-03-03 DIAGNOSIS — J309 Allergic rhinitis, unspecified: Secondary | ICD-10-CM | POA: Diagnosis not present

## 2022-03-03 DIAGNOSIS — F0150 Vascular dementia without behavioral disturbance: Secondary | ICD-10-CM | POA: Diagnosis not present

## 2022-03-03 DIAGNOSIS — N390 Urinary tract infection, site not specified: Secondary | ICD-10-CM | POA: Diagnosis not present

## 2022-03-03 DIAGNOSIS — E118 Type 2 diabetes mellitus with unspecified complications: Secondary | ICD-10-CM | POA: Diagnosis not present

## 2022-03-03 DIAGNOSIS — I10 Essential (primary) hypertension: Secondary | ICD-10-CM | POA: Diagnosis not present

## 2022-03-03 DIAGNOSIS — J189 Pneumonia, unspecified organism: Secondary | ICD-10-CM | POA: Diagnosis not present

## 2022-03-03 DIAGNOSIS — A419 Sepsis, unspecified organism: Secondary | ICD-10-CM | POA: Diagnosis not present

## 2022-03-03 DIAGNOSIS — E785 Hyperlipidemia, unspecified: Secondary | ICD-10-CM | POA: Diagnosis not present

## 2022-03-03 DIAGNOSIS — N401 Enlarged prostate with lower urinary tract symptoms: Secondary | ICD-10-CM | POA: Diagnosis not present

## 2022-03-03 NOTE — Telephone Encounter (Signed)
Pt wife called to canx bob's 11/15 appt b/c he is in rehab. She would like a call back to discuss bob's situation. Said since being in hospital/rehab he has deteriorated a lot and she needs advice

## 2022-03-05 ENCOUNTER — Ambulatory Visit: Payer: Medicare Other | Admitting: Physician Assistant

## 2022-03-05 ENCOUNTER — Other Ambulatory Visit: Payer: Self-pay | Admitting: *Deleted

## 2022-03-05 NOTE — Patient Outreach (Signed)
Mr. Sabedra resides in [facility]. Screening for potential care coordination needs as benefit of insurance plan and PCP.   Spoke with Marita [place], SNF social worker to make aware Probation officer following for transition plans. Mr. Achord is from home with spouse. Care plan meeting scheduled for tomorrow. Anticipated plan is to return home with spouse.   PCP office [facility] has [facility] care management.   Will continue to follow while resides in SNF.    Marthenia Rolling, MSN, RN,BSN [place] Acute Care Coordinator [phone] (Direct dial)

## 2022-03-05 NOTE — Telephone Encounter (Signed)
Called patients wife back and left a message for a call back. 

## 2022-03-06 DIAGNOSIS — F0150 Vascular dementia without behavioral disturbance: Secondary | ICD-10-CM | POA: Diagnosis not present

## 2022-03-06 DIAGNOSIS — A419 Sepsis, unspecified organism: Secondary | ICD-10-CM | POA: Diagnosis not present

## 2022-03-06 DIAGNOSIS — I10 Essential (primary) hypertension: Secondary | ICD-10-CM | POA: Diagnosis not present

## 2022-03-06 DIAGNOSIS — N401 Enlarged prostate with lower urinary tract symptoms: Secondary | ICD-10-CM | POA: Diagnosis not present

## 2022-03-06 DIAGNOSIS — E785 Hyperlipidemia, unspecified: Secondary | ICD-10-CM | POA: Diagnosis not present

## 2022-03-06 DIAGNOSIS — J309 Allergic rhinitis, unspecified: Secondary | ICD-10-CM | POA: Diagnosis not present

## 2022-03-06 DIAGNOSIS — J189 Pneumonia, unspecified organism: Secondary | ICD-10-CM | POA: Diagnosis not present

## 2022-03-06 DIAGNOSIS — N390 Urinary tract infection, site not specified: Secondary | ICD-10-CM | POA: Diagnosis not present

## 2022-03-06 DIAGNOSIS — E118 Type 2 diabetes mellitus with unspecified complications: Secondary | ICD-10-CM | POA: Diagnosis not present

## 2022-03-06 NOTE — Telephone Encounter (Signed)
PTS WIFE RETURNED CALL. BEST NUMBER TO REACH HER IS [phone].  Called patient and informed her of Dr. Doristine Devoid advice in regards to patients situation. Patients wife verbalized understanding and had no further questions or concerns.

## 2022-03-09 DIAGNOSIS — R339 Retention of urine, unspecified: Secondary | ICD-10-CM | POA: Diagnosis not present

## 2022-03-09 DIAGNOSIS — E785 Hyperlipidemia, unspecified: Secondary | ICD-10-CM | POA: Diagnosis not present

## 2022-03-09 DIAGNOSIS — I10 Essential (primary) hypertension: Secondary | ICD-10-CM | POA: Diagnosis not present

## 2022-03-09 DIAGNOSIS — E1165 Type 2 diabetes mellitus with hyperglycemia: Secondary | ICD-10-CM | POA: Diagnosis not present

## 2022-03-10 ENCOUNTER — Other Ambulatory Visit: Payer: Self-pay | Admitting: *Deleted

## 2022-03-10 DIAGNOSIS — E785 Hyperlipidemia, unspecified: Secondary | ICD-10-CM | POA: Diagnosis not present

## 2022-03-10 DIAGNOSIS — A419 Sepsis, unspecified organism: Secondary | ICD-10-CM | POA: Diagnosis not present

## 2022-03-10 DIAGNOSIS — E118 Type 2 diabetes mellitus with unspecified complications: Secondary | ICD-10-CM | POA: Diagnosis not present

## 2022-03-10 DIAGNOSIS — I10 Essential (primary) hypertension: Secondary | ICD-10-CM | POA: Diagnosis not present

## 2022-03-10 DIAGNOSIS — J189 Pneumonia, unspecified organism: Secondary | ICD-10-CM | POA: Diagnosis not present

## 2022-03-10 DIAGNOSIS — J309 Allergic rhinitis, unspecified: Secondary | ICD-10-CM | POA: Diagnosis not present

## 2022-03-10 DIAGNOSIS — F0150 Vascular dementia without behavioral disturbance: Secondary | ICD-10-CM | POA: Diagnosis not present

## 2022-03-10 DIAGNOSIS — N401 Enlarged prostate with lower urinary tract symptoms: Secondary | ICD-10-CM | POA: Diagnosis not present

## 2022-03-10 DIAGNOSIS — N390 Urinary tract infection, site not specified: Secondary | ICD-10-CM | POA: Diagnosis not present

## 2022-03-10 NOTE — Patient Outreach (Signed)
[place] Coordinator follow up. Per [place] Mr. Statler resides in [facility]. Screening for potential care coordination services as benefit of insurance plan and PCP.  Secure communication sent to SNF social worker to inquire about transition plans.   PCP office [facility] has [facility] care management.   Will continue to follow.   Marthenia Rolling, MSN, RN,BSN [place] Acute Care Coordinator [phone] (Direct dial)

## 2022-03-11 ENCOUNTER — Other Ambulatory Visit: Payer: Self-pay | Admitting: *Deleted

## 2022-03-11 NOTE — Patient Outreach (Signed)
[facility] Post- Acute Care Coordinator follow up. Francisco Leon resides in [facility]. Screening for care coordination services as benefit of insurance plan and PCP.   Update received from [facility] social worker. Francisco Leon will return home on 03/12/22 with spouse. Will have home health thru [facility] for PT/OT/Nursing. Wheelchair, hospital bed, and bedside commode has been ordered.   Will send secure notification to [facility] care management upon SNF discharge.   PCP office [facility] has [facility] care management.   Marthenia Rolling, MSN, RN,BSN [place] Acute Care Coordinator [phone] (Direct dial)

## 2022-03-17 ENCOUNTER — Other Ambulatory Visit: Payer: Self-pay | Admitting: *Deleted

## 2022-03-17 NOTE — Patient Outreach (Signed)
[place] Coordinator follow up. VerIfied in [facility] [name] discharged from [facility] on 03/12/22.  SNF social worker previously reported [name] will have [facility] for PT/OT/nursing and DME.   [facility] has [facility] care management.   Will send secure notification of discharge from SNF to [facility].  Marthenia Rolling, MSN, RN,BSN [place] Acute Care Coordinator [phone] (Direct dial)

## 2022-03-18 DIAGNOSIS — D62 Acute posthemorrhagic anemia: Secondary | ICD-10-CM | POA: Diagnosis not present

## 2022-03-18 DIAGNOSIS — R6521 Severe sepsis with septic shock: Secondary | ICD-10-CM | POA: Diagnosis not present

## 2022-03-18 DIAGNOSIS — A419 Sepsis, unspecified organism: Secondary | ICD-10-CM | POA: Diagnosis not present

## 2022-03-18 DIAGNOSIS — Z9181 History of falling: Secondary | ICD-10-CM | POA: Diagnosis not present

## 2022-03-18 DIAGNOSIS — E1140 Type 2 diabetes mellitus with diabetic neuropathy, unspecified: Secondary | ICD-10-CM | POA: Diagnosis not present

## 2022-03-18 DIAGNOSIS — N401 Enlarged prostate with lower urinary tract symptoms: Secondary | ICD-10-CM | POA: Diagnosis not present

## 2022-03-18 DIAGNOSIS — R338 Other retention of urine: Secondary | ICD-10-CM | POA: Diagnosis not present

## 2022-03-18 DIAGNOSIS — N138 Other obstructive and reflux uropathy: Secondary | ICD-10-CM | POA: Diagnosis not present

## 2022-03-18 DIAGNOSIS — Z466 Encounter for fitting and adjustment of urinary device: Secondary | ICD-10-CM | POA: Diagnosis not present

## 2022-03-18 DIAGNOSIS — I10 Essential (primary) hypertension: Secondary | ICD-10-CM | POA: Diagnosis not present

## 2022-03-18 DIAGNOSIS — E876 Hypokalemia: Secondary | ICD-10-CM | POA: Diagnosis not present

## 2022-03-18 DIAGNOSIS — E1165 Type 2 diabetes mellitus with hyperglycemia: Secondary | ICD-10-CM | POA: Diagnosis not present

## 2022-03-18 DIAGNOSIS — J159 Unspecified bacterial pneumonia: Secondary | ICD-10-CM | POA: Diagnosis not present

## 2022-03-18 DIAGNOSIS — N390 Urinary tract infection, site not specified: Secondary | ICD-10-CM | POA: Diagnosis not present

## 2022-03-18 DIAGNOSIS — G9340 Encephalopathy, unspecified: Secondary | ICD-10-CM | POA: Diagnosis not present

## 2022-03-18 DIAGNOSIS — I493 Ventricular premature depolarization: Secondary | ICD-10-CM | POA: Diagnosis not present

## 2022-03-18 DIAGNOSIS — Z556 Problems related to health literacy: Secondary | ICD-10-CM | POA: Diagnosis not present

## 2022-03-18 DIAGNOSIS — Z7984 Long term (current) use of oral hypoglycemic drugs: Secondary | ICD-10-CM | POA: Diagnosis not present

## 2022-03-18 DIAGNOSIS — F0150 Vascular dementia without behavioral disturbance: Secondary | ICD-10-CM | POA: Diagnosis not present

## 2022-03-19 DIAGNOSIS — R6521 Severe sepsis with septic shock: Secondary | ICD-10-CM | POA: Diagnosis not present

## 2022-03-19 DIAGNOSIS — N138 Other obstructive and reflux uropathy: Secondary | ICD-10-CM | POA: Diagnosis not present

## 2022-03-19 DIAGNOSIS — F0150 Vascular dementia without behavioral disturbance: Secondary | ICD-10-CM | POA: Diagnosis not present

## 2022-03-19 DIAGNOSIS — A419 Sepsis, unspecified organism: Secondary | ICD-10-CM | POA: Diagnosis not present

## 2022-03-19 DIAGNOSIS — N401 Enlarged prostate with lower urinary tract symptoms: Secondary | ICD-10-CM | POA: Diagnosis not present

## 2022-03-19 DIAGNOSIS — R338 Other retention of urine: Secondary | ICD-10-CM | POA: Diagnosis not present

## 2022-03-21 DIAGNOSIS — A419 Sepsis, unspecified organism: Secondary | ICD-10-CM | POA: Diagnosis not present

## 2022-03-21 DIAGNOSIS — R6521 Severe sepsis with septic shock: Secondary | ICD-10-CM | POA: Diagnosis not present

## 2022-03-21 DIAGNOSIS — R338 Other retention of urine: Secondary | ICD-10-CM | POA: Diagnosis not present

## 2022-03-21 DIAGNOSIS — F0150 Vascular dementia without behavioral disturbance: Secondary | ICD-10-CM | POA: Diagnosis not present

## 2022-03-21 DIAGNOSIS — N138 Other obstructive and reflux uropathy: Secondary | ICD-10-CM | POA: Diagnosis not present

## 2022-03-21 DIAGNOSIS — N401 Enlarged prostate with lower urinary tract symptoms: Secondary | ICD-10-CM | POA: Diagnosis not present

## 2022-03-25 DIAGNOSIS — R339 Retention of urine, unspecified: Secondary | ICD-10-CM | POA: Diagnosis not present

## 2022-03-25 DIAGNOSIS — R338 Other retention of urine: Secondary | ICD-10-CM | POA: Diagnosis not present

## 2022-03-26 DIAGNOSIS — F0150 Vascular dementia without behavioral disturbance: Secondary | ICD-10-CM | POA: Diagnosis not present

## 2022-03-26 DIAGNOSIS — A419 Sepsis, unspecified organism: Secondary | ICD-10-CM | POA: Diagnosis not present

## 2022-03-26 DIAGNOSIS — R338 Other retention of urine: Secondary | ICD-10-CM | POA: Diagnosis not present

## 2022-03-26 DIAGNOSIS — R6521 Severe sepsis with septic shock: Secondary | ICD-10-CM | POA: Diagnosis not present

## 2022-03-26 DIAGNOSIS — N401 Enlarged prostate with lower urinary tract symptoms: Secondary | ICD-10-CM | POA: Diagnosis not present

## 2022-03-26 DIAGNOSIS — N138 Other obstructive and reflux uropathy: Secondary | ICD-10-CM | POA: Diagnosis not present

## 2022-03-27 DIAGNOSIS — R6521 Severe sepsis with septic shock: Secondary | ICD-10-CM | POA: Diagnosis not present

## 2022-03-27 DIAGNOSIS — N138 Other obstructive and reflux uropathy: Secondary | ICD-10-CM | POA: Diagnosis not present

## 2022-03-27 DIAGNOSIS — A419 Sepsis, unspecified organism: Secondary | ICD-10-CM | POA: Diagnosis not present

## 2022-03-27 DIAGNOSIS — F0150 Vascular dementia without behavioral disturbance: Secondary | ICD-10-CM | POA: Diagnosis not present

## 2022-03-27 DIAGNOSIS — N401 Enlarged prostate with lower urinary tract symptoms: Secondary | ICD-10-CM | POA: Diagnosis not present

## 2022-03-27 DIAGNOSIS — R338 Other retention of urine: Secondary | ICD-10-CM | POA: Diagnosis not present

## 2022-03-28 DIAGNOSIS — N401 Enlarged prostate with lower urinary tract symptoms: Secondary | ICD-10-CM | POA: Diagnosis not present

## 2022-03-28 DIAGNOSIS — A419 Sepsis, unspecified organism: Secondary | ICD-10-CM | POA: Diagnosis not present

## 2022-03-28 DIAGNOSIS — N138 Other obstructive and reflux uropathy: Secondary | ICD-10-CM | POA: Diagnosis not present

## 2022-03-28 DIAGNOSIS — R6521 Severe sepsis with septic shock: Secondary | ICD-10-CM | POA: Diagnosis not present

## 2022-03-28 DIAGNOSIS — R338 Other retention of urine: Secondary | ICD-10-CM | POA: Diagnosis not present

## 2022-03-28 DIAGNOSIS — F0150 Vascular dementia without behavioral disturbance: Secondary | ICD-10-CM | POA: Diagnosis not present

## 2022-03-31 DIAGNOSIS — R338 Other retention of urine: Secondary | ICD-10-CM | POA: Diagnosis not present

## 2022-03-31 DIAGNOSIS — N401 Enlarged prostate with lower urinary tract symptoms: Secondary | ICD-10-CM | POA: Diagnosis not present

## 2022-03-31 DIAGNOSIS — N138 Other obstructive and reflux uropathy: Secondary | ICD-10-CM | POA: Diagnosis not present

## 2022-03-31 DIAGNOSIS — R6521 Severe sepsis with septic shock: Secondary | ICD-10-CM | POA: Diagnosis not present

## 2022-03-31 DIAGNOSIS — A419 Sepsis, unspecified organism: Secondary | ICD-10-CM | POA: Diagnosis not present

## 2022-03-31 DIAGNOSIS — F0150 Vascular dementia without behavioral disturbance: Secondary | ICD-10-CM | POA: Diagnosis not present

## 2022-04-01 ENCOUNTER — Ambulatory Visit: Payer: Medicare Other | Admitting: Neurology

## 2022-04-01 DIAGNOSIS — A419 Sepsis, unspecified organism: Secondary | ICD-10-CM | POA: Diagnosis not present

## 2022-04-01 DIAGNOSIS — N138 Other obstructive and reflux uropathy: Secondary | ICD-10-CM | POA: Diagnosis not present

## 2022-04-01 DIAGNOSIS — R338 Other retention of urine: Secondary | ICD-10-CM | POA: Diagnosis not present

## 2022-04-01 DIAGNOSIS — R6521 Severe sepsis with septic shock: Secondary | ICD-10-CM | POA: Diagnosis not present

## 2022-04-01 DIAGNOSIS — F0150 Vascular dementia without behavioral disturbance: Secondary | ICD-10-CM | POA: Diagnosis not present

## 2022-04-01 DIAGNOSIS — N401 Enlarged prostate with lower urinary tract symptoms: Secondary | ICD-10-CM | POA: Diagnosis not present

## 2022-04-04 DIAGNOSIS — F0150 Vascular dementia without behavioral disturbance: Secondary | ICD-10-CM | POA: Diagnosis not present

## 2022-04-04 DIAGNOSIS — N138 Other obstructive and reflux uropathy: Secondary | ICD-10-CM | POA: Diagnosis not present

## 2022-04-04 DIAGNOSIS — A419 Sepsis, unspecified organism: Secondary | ICD-10-CM | POA: Diagnosis not present

## 2022-04-04 DIAGNOSIS — R6521 Severe sepsis with septic shock: Secondary | ICD-10-CM | POA: Diagnosis not present

## 2022-04-04 DIAGNOSIS — N401 Enlarged prostate with lower urinary tract symptoms: Secondary | ICD-10-CM | POA: Diagnosis not present

## 2022-04-04 DIAGNOSIS — R338 Other retention of urine: Secondary | ICD-10-CM | POA: Diagnosis not present

## 2022-04-07 ENCOUNTER — Encounter (HOSPITAL_BASED_OUTPATIENT_CLINIC_OR_DEPARTMENT_OTHER): Payer: Self-pay | Admitting: Family Medicine

## 2022-04-07 ENCOUNTER — Telehealth: Payer: Self-pay

## 2022-04-07 DIAGNOSIS — K219 Gastro-esophageal reflux disease without esophagitis: Secondary | ICD-10-CM | POA: Diagnosis not present

## 2022-04-07 DIAGNOSIS — R6521 Severe sepsis with septic shock: Secondary | ICD-10-CM | POA: Diagnosis not present

## 2022-04-07 DIAGNOSIS — N138 Other obstructive and reflux uropathy: Secondary | ICD-10-CM | POA: Diagnosis not present

## 2022-04-07 DIAGNOSIS — E7800 Pure hypercholesterolemia, unspecified: Secondary | ICD-10-CM | POA: Diagnosis not present

## 2022-04-07 DIAGNOSIS — A419 Sepsis, unspecified organism: Secondary | ICD-10-CM | POA: Diagnosis not present

## 2022-04-07 DIAGNOSIS — E1169 Type 2 diabetes mellitus with other specified complication: Secondary | ICD-10-CM | POA: Diagnosis not present

## 2022-04-07 DIAGNOSIS — N401 Enlarged prostate with lower urinary tract symptoms: Secondary | ICD-10-CM | POA: Diagnosis not present

## 2022-04-07 DIAGNOSIS — W19XXXA Unspecified fall, initial encounter: Secondary | ICD-10-CM

## 2022-04-07 DIAGNOSIS — R338 Other retention of urine: Secondary | ICD-10-CM | POA: Diagnosis not present

## 2022-04-07 DIAGNOSIS — N400 Enlarged prostate without lower urinary tract symptoms: Secondary | ICD-10-CM | POA: Diagnosis not present

## 2022-04-07 DIAGNOSIS — F0150 Vascular dementia without behavioral disturbance: Secondary | ICD-10-CM | POA: Diagnosis not present

## 2022-04-07 DIAGNOSIS — I10 Essential (primary) hypertension: Secondary | ICD-10-CM | POA: Diagnosis not present

## 2022-04-07 NOTE — Telephone Encounter (Signed)
(  4:47 pm) PC SW scheduled an initial palliative care visit/consult with patient. Visit is scheduled for 04/09/22 @ 10:30 am.

## 2022-04-08 DIAGNOSIS — F0150 Vascular dementia without behavioral disturbance: Secondary | ICD-10-CM | POA: Diagnosis not present

## 2022-04-08 DIAGNOSIS — R6521 Severe sepsis with septic shock: Secondary | ICD-10-CM | POA: Diagnosis not present

## 2022-04-08 DIAGNOSIS — A419 Sepsis, unspecified organism: Secondary | ICD-10-CM | POA: Diagnosis not present

## 2022-04-08 DIAGNOSIS — R338 Other retention of urine: Secondary | ICD-10-CM | POA: Diagnosis not present

## 2022-04-08 DIAGNOSIS — N138 Other obstructive and reflux uropathy: Secondary | ICD-10-CM | POA: Diagnosis not present

## 2022-04-08 DIAGNOSIS — S0990XA Unspecified injury of head, initial encounter: Secondary | ICD-10-CM | POA: Diagnosis not present

## 2022-04-08 DIAGNOSIS — N401 Enlarged prostate with lower urinary tract symptoms: Secondary | ICD-10-CM | POA: Diagnosis not present

## 2022-04-09 ENCOUNTER — Other Ambulatory Visit: Payer: Self-pay

## 2022-04-09 DIAGNOSIS — Z515 Encounter for palliative care: Secondary | ICD-10-CM

## 2022-04-09 NOTE — Progress Notes (Signed)
1033- Palliative Care Note  RN met with wife via phone for initial palliative visit. 2 patient identifiers used.  Disease: Pt has vascular dementia, diabetes, BPH. Was admitted to hospital 02/12/22 with fever, cough. Was discharged to SNF for rehab purposes on Nov 7. Came home from SNF on 03/14/22.   Cognition- Wife reports that pt has " dementia caused by an aging brain." He has periods of confusion but other times he is very lucid. Does not adjust well to changes in routine and can identify the changes. Gives an example of , " so we used to stay up pretty late. Go to bed around 11pm. Now we have to get him in to bed earlier because I can't manage him by myself. My son helps me and he has to get up for work at [employer]. My husband will look at his watch and tell me that he doesn't go to bed this early."  Mobility- Pt uses walker and wheelchair. To get around depending on the day. Has a bedside commode. Wife reports that pt's strength in walking correlates to his sleep and mental clarity of the day.  Nutrition- Wife reports pt eating well. Eats 3/4 of meals. Eats 2 meals and a big snack daily. Also drinks a lot of water thru the day and tea at dinner time.   GI/GU- wife states, "he has an enormous prostate according to his urologist and has an indwelling catheter for now that is changed once a month." Reports last change was 03/26/22.  Sleeping- wife reports that he is a restless sleeper. That he has a hospital bed set up in the living room that he slept in when he first got home from rehab, but that he kept asking to go home and didn't recognize it as being in their dining room. She reports that she lays down with him more times than not to get him to sleep and then will get up and do a few things before she is ready to go to bed.   Wife reports that pt had two small "places on his behind" when he got home from SNF. HH with [facility] has been coming out to address this and also for PT visits. RN visits  ended last week. Pt has two more physical therapy visits.  ACP- Pt is a full code. Wife states that she and her children ( both of whom live with them) have discussed this but have not fully decided. Wife also asked about HCPOA process.   Palliative Care/ Hospice- RN explained in detail roles of each. Answered questions that spouse had.   Next appt scheduled for in person on 04/17/22 at 71 with RN and SW to address ACP questions.   Jacqulyn Cane, RN

## 2022-04-10 DIAGNOSIS — N401 Enlarged prostate with lower urinary tract symptoms: Secondary | ICD-10-CM | POA: Diagnosis not present

## 2022-04-10 DIAGNOSIS — N138 Other obstructive and reflux uropathy: Secondary | ICD-10-CM | POA: Diagnosis not present

## 2022-04-10 DIAGNOSIS — F0150 Vascular dementia without behavioral disturbance: Secondary | ICD-10-CM | POA: Diagnosis not present

## 2022-04-10 DIAGNOSIS — R6521 Severe sepsis with septic shock: Secondary | ICD-10-CM | POA: Diagnosis not present

## 2022-04-10 DIAGNOSIS — R338 Other retention of urine: Secondary | ICD-10-CM | POA: Diagnosis not present

## 2022-04-10 DIAGNOSIS — A419 Sepsis, unspecified organism: Secondary | ICD-10-CM | POA: Diagnosis not present

## 2022-04-17 ENCOUNTER — Other Ambulatory Visit: Payer: Medicare Other

## 2022-04-17 DIAGNOSIS — F0150 Vascular dementia without behavioral disturbance: Secondary | ICD-10-CM | POA: Diagnosis not present

## 2022-04-17 DIAGNOSIS — I493 Ventricular premature depolarization: Secondary | ICD-10-CM | POA: Diagnosis not present

## 2022-04-17 DIAGNOSIS — Z466 Encounter for fitting and adjustment of urinary device: Secondary | ICD-10-CM | POA: Diagnosis not present

## 2022-04-17 DIAGNOSIS — D62 Acute posthemorrhagic anemia: Secondary | ICD-10-CM | POA: Diagnosis not present

## 2022-04-17 DIAGNOSIS — E876 Hypokalemia: Secondary | ICD-10-CM | POA: Diagnosis not present

## 2022-04-17 DIAGNOSIS — Z7984 Long term (current) use of oral hypoglycemic drugs: Secondary | ICD-10-CM | POA: Diagnosis not present

## 2022-04-17 DIAGNOSIS — G9340 Encephalopathy, unspecified: Secondary | ICD-10-CM | POA: Diagnosis not present

## 2022-04-17 DIAGNOSIS — Z9181 History of falling: Secondary | ICD-10-CM | POA: Diagnosis not present

## 2022-04-17 DIAGNOSIS — R338 Other retention of urine: Secondary | ICD-10-CM | POA: Diagnosis not present

## 2022-04-17 DIAGNOSIS — J159 Unspecified bacterial pneumonia: Secondary | ICD-10-CM | POA: Diagnosis not present

## 2022-04-17 DIAGNOSIS — E1165 Type 2 diabetes mellitus with hyperglycemia: Secondary | ICD-10-CM | POA: Diagnosis not present

## 2022-04-17 DIAGNOSIS — N138 Other obstructive and reflux uropathy: Secondary | ICD-10-CM | POA: Diagnosis not present

## 2022-04-17 DIAGNOSIS — N401 Enlarged prostate with lower urinary tract symptoms: Secondary | ICD-10-CM | POA: Diagnosis not present

## 2022-04-17 DIAGNOSIS — I10 Essential (primary) hypertension: Secondary | ICD-10-CM | POA: Diagnosis not present

## 2022-04-17 DIAGNOSIS — Z556 Problems related to health literacy: Secondary | ICD-10-CM | POA: Diagnosis not present

## 2022-04-17 DIAGNOSIS — R6521 Severe sepsis with septic shock: Secondary | ICD-10-CM | POA: Diagnosis not present

## 2022-04-17 DIAGNOSIS — E1140 Type 2 diabetes mellitus with diabetic neuropathy, unspecified: Secondary | ICD-10-CM | POA: Diagnosis not present

## 2022-04-17 DIAGNOSIS — A419 Sepsis, unspecified organism: Secondary | ICD-10-CM | POA: Diagnosis not present

## 2022-04-17 DIAGNOSIS — N390 Urinary tract infection, site not specified: Secondary | ICD-10-CM | POA: Diagnosis not present

## 2022-04-22 ENCOUNTER — Other Ambulatory Visit: Payer: Medicare Other

## 2022-04-22 DIAGNOSIS — Z515 Encounter for palliative care: Secondary | ICD-10-CM

## 2022-04-22 NOTE — Progress Notes (Signed)
[facility] Encounter Note  PATIENT NAME: Francisco Leon DOB: [birth year] MRN: [id number]   PRIMARY CARE PROVIDER: Collene Leyden, MD               RN completed follow up visit in her home. Wife and daughter also present.   History of Present Illness: 89yo male pt with vascular dementia who lives at home with spouse and two grown children who have moved back in s/p divorces. Wife pays for private caregivers for 6 hours per day.   Cognitive: Pt alert and sitting in chair in living room with daughter and caregiver. Pt unable to answer questions appropriately. Makes random comments to questions. Recognizes daughter and wife.   Appetite: no changes since last encounter  Mobility: Pt ambulates with assist of walker and 1 person. Also has wheel chair available in house. Wife reports that his ability follow commands to stand, pivot, and sit have began to be impacted by his dementia.  GI/GU: Pt has indwelling catheter. Wife reports that it is supposed to be changed every month. Straw colored urine noted in bag. Urine is clear, not hazy or cloudy.  Goals of Care: Spoke at length with wife regarding this topic today. She would like to keep pt at home until he passes, but knows that "this is not realistic". Pt is becoming more assertive and agitated at times. Wife gives example, "yesterday, I went to get gas in the car and was only gone for about 30 mins, if that. I had told him where I was going and my son was here with him. When I got back, he was convinced that I had gone to see another man and was very agitated. It took a long time talking to him to get him calmed back down." RN talked with spouse about long term care of a dementia patient. Talked about memory care units. Wife requested more information. Will have [place], Education officer, museum reach out to her with information on this process.   RN also talked at length about the importance of wife taking care of herself, physically, spiritually, and  emotionally.    CODE STATUS: Full Code ADVANCED DIRECTIVES: None MOST FORM: Most form given to wife and reviewed with her. Pt unable to make those decisions most of the time. Does have moments of lucidity that , "we have talked about his wishes." Encouraged wife to have form filled completed and signed by PCP. PPS:   Next Appt Scheduled For: SW to contact wife. RN to make f/u appt in 2-3 weeks.        PHYSICAL EXAM:        LUNGS:  clear bilat CARDIAC:  regular EXTREMITIES: No issues SKIN: Pt has dime sized breakdown area noted to upper mid area of right buttocks. Scab noted surrounded by pink new tissue. Caregiver and wife report that pt had a pink pressure dressing over area until yesterday when they removed it to let the area "get air and dry out some". Reports applying barrier cream to the area several times a day. Talked with wife and caregiver regarding importance of pt shifting weight around and changing positions often. Voiced understanding.  NEURO: Alert but oriented only to self and wife.     Jacqulyn Cane, RN

## 2022-04-23 DIAGNOSIS — N401 Enlarged prostate with lower urinary tract symptoms: Secondary | ICD-10-CM | POA: Diagnosis not present

## 2022-04-23 DIAGNOSIS — N138 Other obstructive and reflux uropathy: Secondary | ICD-10-CM | POA: Diagnosis not present

## 2022-04-23 DIAGNOSIS — R338 Other retention of urine: Secondary | ICD-10-CM | POA: Diagnosis not present

## 2022-04-23 DIAGNOSIS — R6521 Severe sepsis with septic shock: Secondary | ICD-10-CM | POA: Diagnosis not present

## 2022-04-23 DIAGNOSIS — A419 Sepsis, unspecified organism: Secondary | ICD-10-CM | POA: Diagnosis not present

## 2022-04-23 DIAGNOSIS — F0150 Vascular dementia without behavioral disturbance: Secondary | ICD-10-CM | POA: Diagnosis not present

## 2022-04-24 DIAGNOSIS — R310 Gross hematuria: Secondary | ICD-10-CM | POA: Diagnosis not present

## 2022-04-24 DIAGNOSIS — R339 Retention of urine, unspecified: Secondary | ICD-10-CM | POA: Diagnosis not present

## 2022-04-24 DIAGNOSIS — N401 Enlarged prostate with lower urinary tract symptoms: Secondary | ICD-10-CM | POA: Diagnosis not present

## 2022-04-25 ENCOUNTER — Other Ambulatory Visit: Payer: Medicare Other

## 2022-04-25 DIAGNOSIS — Z515 Encounter for palliative care: Secondary | ICD-10-CM

## 2022-04-25 NOTE — Progress Notes (Signed)
[facility] SW NOTE  PATIENT NAME: Francisco Leon DOB: [birth year] MRN: [id number]  PRIMARY CARE PROVIDER: Collene Leyden, MD  RESPONSIBLE PARTY:  Acct ID - Guarantor Home Phone Work Phone Relationship Acct Type  [id number] TOBE, [phone]  Self P/F     [place], [place], [place] [address]   Social Work Telephonic Encounter (3:09 pm-3:24 pm)   PC SW completed a telephonic encounter with patient's wife-Francisco. SW provided education to her regarding memory care, what to consider when placing patient, and what is needed to place patient. SW also provided education to her regarding a local day program ([facility]) and how to access this resource. Mrs. Leon advised that she currently has in-home care. She was interested in the education and resources as she plans for future next steps.  SW reinforced ongoing support as needed. No other concerns noted.    [address] [place], [place]

## 2022-04-28 ENCOUNTER — Other Ambulatory Visit: Payer: Self-pay

## 2022-04-28 ENCOUNTER — Telehealth: Payer: Self-pay | Admitting: Neurology

## 2022-04-28 ENCOUNTER — Emergency Department (HOSPITAL_BASED_OUTPATIENT_CLINIC_OR_DEPARTMENT_OTHER)
Admission: EM | Admit: 2022-04-28 | Discharge: 2022-04-28 | Disposition: A | Discharge status: Home / Self Care | Payer: Medicare Other | Attending: Emergency Medicine | Admitting: Emergency Medicine

## 2022-04-28 DIAGNOSIS — K819 Cholecystitis, unspecified: Secondary | ICD-10-CM | POA: Diagnosis not present

## 2022-04-28 DIAGNOSIS — F0150 Vascular dementia without behavioral disturbance: Secondary | ICD-10-CM | POA: Diagnosis not present

## 2022-04-28 DIAGNOSIS — F0390 Unspecified dementia without behavioral disturbance: Secondary | ICD-10-CM | POA: Diagnosis not present

## 2022-04-28 DIAGNOSIS — A419 Sepsis, unspecified organism: Secondary | ICD-10-CM | POA: Diagnosis not present

## 2022-04-28 DIAGNOSIS — Z794 Long term (current) use of insulin: Secondary | ICD-10-CM | POA: Diagnosis not present

## 2022-04-28 DIAGNOSIS — Y732 Prosthetic and other implants, materials and accessory gastroenterology and urology devices associated with adverse incidents: Secondary | ICD-10-CM | POA: Insufficient documentation

## 2022-04-28 DIAGNOSIS — R6521 Severe sepsis with septic shock: Secondary | ICD-10-CM | POA: Diagnosis not present

## 2022-04-28 DIAGNOSIS — N390 Urinary tract infection, site not specified: Secondary | ICD-10-CM | POA: Diagnosis not present

## 2022-04-28 DIAGNOSIS — N3090 Cystitis, unspecified without hematuria: Secondary | ICD-10-CM

## 2022-04-28 DIAGNOSIS — T83091A Other mechanical complication of indwelling urethral catheter, initial encounter: Secondary | ICD-10-CM | POA: Diagnosis not present

## 2022-04-28 DIAGNOSIS — R338 Other retention of urine: Secondary | ICD-10-CM | POA: Diagnosis not present

## 2022-04-28 DIAGNOSIS — N138 Other obstructive and reflux uropathy: Secondary | ICD-10-CM | POA: Diagnosis not present

## 2022-04-28 DIAGNOSIS — T839XXA Unspecified complication of genitourinary prosthetic device, implant and graft, initial encounter: Secondary | ICD-10-CM

## 2022-04-28 DIAGNOSIS — N401 Enlarged prostate with lower urinary tract symptoms: Secondary | ICD-10-CM | POA: Diagnosis not present

## 2022-04-28 LAB — URINALYSIS, ROUTINE W REFLEX MICROSCOPIC
Bilirubin Urine: NEGATIVE
Glucose, UA: NEGATIVE mg/dL
Ketones, ur: NEGATIVE mg/dL
Nitrite: NEGATIVE
Protein, ur: NEGATIVE mg/dL
Specific Gravity, Urine: 1.008 (ref 1.005–1.030)
pH: 6.5 (ref 5.0–8.0)

## 2022-04-28 LAB — URINALYSIS, MICROSCOPIC (REFLEX)
Bacteria, UA: NONE SEEN
Squamous Epithelial / HPF: NONE SEEN /HPF (ref 0–5)
WBC, UA: >50 WBC/hpf (ref 0–5)

## 2022-04-28 LAB — CBC WITH DIFFERENTIAL/PLATELET
Abs Immature Granulocytes: 0.04 10*3/uL (ref 0.00–0.07)
Basophils Absolute: 0.1 10*3/uL (ref 0.0–0.1)
Basophils Relative: 1 %
Eosinophils Absolute: 0.3 10*3/uL (ref 0.0–0.5)
Eosinophils Relative: 2 %
HCT: 35 % — ABNORMAL LOW (ref 39.0–52.0)
Hemoglobin: 11.4 g/dL — ABNORMAL LOW (ref 13.0–17.0)
Immature Granulocytes: 0 %
Lymphocytes Relative: 17 %
Lymphs Abs: 1.9 10*3/uL (ref 0.7–4.0)
MCH: 26.9 pg (ref 26.0–34.0)
MCHC: 32.6 g/dL (ref 30.0–36.0)
MCV: 82.5 fL (ref 80.0–100.0)
Monocytes Absolute: 1 10*3/uL (ref 0.1–1.0)
Monocytes Relative: 10 %
Neutro Abs: 7.6 10*3/uL (ref 1.7–7.7)
Neutrophils Relative %: 70 %
Platelets: 276 10*3/uL (ref 150–400)
RBC: 4.24 MIL/uL (ref 4.22–5.81)
RDW: 15.1 % (ref 11.5–15.5)
WBC: 10.9 10*3/uL — ABNORMAL HIGH (ref 4.0–10.5)
nRBC: 0 % (ref 0.0–0.2)

## 2022-04-28 LAB — COMPREHENSIVE METABOLIC PANEL
ALT: 7 U/L (ref 0–44)
AST: 10 U/L — ABNORMAL LOW (ref 15–41)
Albumin: 3.9 g/dL (ref 3.5–5.0)
Alkaline Phosphatase: 82 U/L (ref 38–126)
Anion gap: 10 (ref 5–15)
BUN: 14 mg/dL (ref 8–23)
CO2: 26 mmol/L (ref 22–32)
Calcium: 10.7 mg/dL — ABNORMAL HIGH (ref 8.9–10.3)
Chloride: 100 mmol/L (ref 98–111)
Creatinine, Ser: 0.67 mg/dL (ref 0.61–1.24)
GFR, Estimated: >60 mL/min (ref >60)
Glucose, Bld: 118 mg/dL — ABNORMAL HIGH (ref 70–99)
Potassium: 3.6 mmol/L (ref 3.5–5.1)
Sodium: 136 mmol/L (ref 135–145)
Total Bilirubin: 0.7 mg/dL (ref 0.3–1.2)
Total Protein: 6.8 g/dL (ref 6.5–8.1)

## 2022-04-28 NOTE — ED Triage Notes (Signed)
BIB [facility] from home. Has foley in place since October. Urine tested by urology- urology called family and told them the Pt had drug resistant UTI and to come to ER. Dementia at baseline-unsure if worse that normal.

## 2022-04-28 NOTE — Telephone Encounter (Signed)
Spoke to patients wife and I have sent this up front to scheduling to get patient in to see Clarise Cruz asap  to discuss agitation and medication. Patient is suffering from Alzheimer's and dementia

## 2022-04-28 NOTE — ED Provider Notes (Signed)
[facility] EMERGENCY DEPT Provider Note   CSN: [id number] Arrival date & time: 04/28/22  1758     History  Chief Complaint  Patient presents with   Cystitis    Francisco Leon is a 89 y.o. male.  [facility] has had an indwelling Foley catheter since the fall.  Had a Foley catheter changed out as usual once a month on Thursday they were contacted today about a growing oral antibiotic resistant Pseudomonas.  Patient has been asymptomatic.  Patient has significant dementia.  Patient's wife states that it would be very difficult for him to be hospitalized.  Because strange environments are very disorienting to him.  She really needs him to be treated at home.  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  Patient has not had any fevers.  Has been no nausea no vomiting.  Does not appear to be in any pain.  His dementia is getting worse but is not significantly aggravated and certainly does not have a depressed mental status.       Home Medications Prior to Admission medications   Medication Sig Start Date End Date Taking? Authorizing Provider  atorvastatin (LIPITOR) 10 MG tablet Take 10 mg by mouth at bedtime. 08/05/21   [provider]  carboxymethylcellulose (REFRESH PLUS) 0.5 % SOLN 1 drop 3 (three) times daily as needed (dry eyes).    [provider]  escitalopram (LEXAPRO) 10 MG tablet Take 1 tablet (10 mg total) by mouth at bedtime. 02/25/22   Terrilee Croak, MD  finasteride (PROSCAR) 5 MG tablet Take 1 tablet (5 mg total) by mouth daily. For massive prostate and urinary bleeding. 02/15/22   Alexis Frock, MD  FREESTYLE LITE test strip 1 each daily. 08/07/21   [provider]  glucose blood (FREESTYLE LITE) test strip USE 1 STRIP TO TEST BLOOD SUGAR DAILY 07/27/15   [provider]  Guaifenesin (MUCINEX MAXIMUM STRENGTH) 1200 MG TB12 Take 1,200 mg by mouth  daily.    [provider]  insulin aspart (NOVOLOG) 100 UNIT/ML injection Inject 0-6 Units into the skin 3 (three) times daily with meals. 02/25/22   Dahal, Marlowe Aschoff, MD  insulin aspart (NOVOLOG) 100 UNIT/ML injection Inject 0-5 Units into the skin at bedtime. 02/25/22   Dahal, Marlowe Aschoff, MD  ipratropium-albuterol (DUONEB) 0.5-2.5 (3) MG/3ML SOLN Take 3 mLs by nebulization every 4 (four) hours as needed. 02/25/22   Terrilee Croak, MD  Lancets (FREESTYLE) lancets daily. 07/12/21   [provider]  levocetirizine (XYZAL) 5 MG tablet Take 1 tablet by mouth every evening. 10/01/15   [provider]  losartan (COZAAR) 50 MG tablet Take 50 mg by mouth at bedtime. 08/20/21   [provider]  melatonin 3 MG TABS tablet Take 1 tablet (3 mg total) by mouth at bedtime. 02/25/22   Terrilee Croak, MD  memantine [facility] TITRATION PACK) tablet pack As directed on pack 01/09/22   Tat, Eustace Quail, DO  metFORMIN (GLUCOPHAGE) 500 MG tablet Take 500 mg by mouth daily with supper. 06/08/21   [provider]  polyethylene glycol (MIRALAX / GLYCOLAX) 17 g packet Take 17 g by mouth daily.    [provider]  QUEtiapine (SEROQUEL) 25 MG tablet Take 1 tablet (25 mg total) by mouth at bedtime. 02/25/22   Dahal, Marlowe Aschoff, MD  senna-docusate (SENOKOT-S) 8.6-50 MG tablet Take 1 tablet by mouth at bedtime as needed for moderate constipation. 02/25/22  Terrilee Croak, MD  sodium chloride (MURO 128) 2 % ophthalmic solution Place 1 drop into both eyes at bedtime.    [provider]  tamsulosin (FLOMAX) 0.4 MG CAPS capsule 0.4 mg in the morning and at bedtime. 08/15/15   [provider]  traZODone (DESYREL) 50 MG tablet Take 1 tablet (50 mg total) by mouth at bedtime as needed for sleep. 02/25/22   Terrilee Croak, MD      Allergies    Dutasteride, Lisinopril, and Sulfa antibiotics    Review of Systems   Review of Systems  Unable to perform ROS: Dementia  Constitutional:  Negative  for chills and fever.  Gastrointestinal:  Negative for abdominal pain, nausea and vomiting.  Neurological:  Negative for dizziness.  Psychiatric/Behavioral:  Positive for confusion.     Physical Exam Updated Vital Signs BP 129/77   Pulse 78   Temp 98.2 F (36.8 C) (Oral)   Resp 17   Ht 1.676 m ('5\' 6"'$ )   Wt 68 kg   SpO2 96%   BMI 24.21 kg/m  Physical Exam Vitals and nursing note reviewed.  Constitutional:      General: He is not in acute distress.    Appearance: Normal appearance. He is well-developed.  HENT:     Head: Normocephalic and atraumatic.     Mouth/Throat:     Mouth: Mucous membranes are moist.  Eyes:     Conjunctiva/sclera: Conjunctivae normal.  Cardiovascular:     Rate and Rhythm: Normal rate and regular rhythm.     Heart sounds: No murmur heard. Pulmonary:     Effort: Pulmonary effort is normal. No respiratory distress.     Breath sounds: Normal breath sounds.  Abdominal:     Palpations: Abdomen is soft.     Tenderness: There is no abdominal tenderness.  Musculoskeletal:        General: No swelling.     Cervical back: Neck supple.  Skin:    General: Skin is warm and dry.     Capillary Refill: Capillary refill takes less than 2 seconds.  Neurological:     Mental Status: He is alert. Mental status is at baseline.  Psychiatric:        Mood and Affect: Mood normal.     ED Results / Procedures / Treatments   Labs (all labs ordered are listed, but only abnormal results are displayed) Labs Reviewed  CBC WITH DIFFERENTIAL/PLATELET - Abnormal; Notable for the following components:      Result Value   WBC 10.9 (*)    Hemoglobin 11.4 (*)    HCT 35.0 (*)    All other components within normal limits  COMPREHENSIVE METABOLIC PANEL - Abnormal; Notable for the following components:   Glucose, Bld 118 (*)    Calcium 10.7 (*)    AST 10 (*)    All other components within normal limits  URINALYSIS, ROUTINE W REFLEX MICROSCOPIC - Abnormal; Notable for the  following components:   Color, Urine STRAW (*)    APPearance CLOUDY (*)    Hgb urine dipstick MODERATE (*)    Leukocytes,Ua LARGE (*)    All other components within normal limits  URINE CULTURE  URINALYSIS, MICROSCOPIC (REFLEX)    EKG None  Radiology No results found.  Procedures Procedures    Medications Ordered in ED Medications - No data to display  ED Course/ Medical Decision Making/ A&P  Medical Decision Making Amount and/or Complexity of Data Reviewed Labs: ordered.   Discussed with Dr. Jeffie Pollock on-call for [facility].  He went over their notes.  It is Pseudomonas that is resistant to oral antibiotics.  However patient also had Pseudomonas on a urine culture early December and was treated with antibiotics which included Cipro.  Which probably led to the resistance.  Said based on the fact that the patient has longstanding indwelling Foley catheter and is not toxic or acting sick at the moment felt that did not have to be treated.  Discussed with patient's wife.  She will take him home.  She will follow-up with [facility] if they do want to treat it could be done at home with IV antibiotics.  His wife was not willing for him to come into the hospital because it would be very disorienting to him if at all possible.  She wanted to avoid this.  Patient's vital signs are very stable here.  No fevers.  Patient currently is well-controlled on his dementia medications.   Final Clinical Impression(s) / ED Diagnoses Final diagnoses:  Problem with Foley catheter, initial encounter [facility])  Cystitis    Rx / DC Orders ED Discharge Orders     None         Fredia Sorrow, MD 04/28/22 2015

## 2022-04-28 NOTE — ED Notes (Signed)
Pt verbalized understanding of d/c instructions, meds, and followup care. Denies questions. VSS, no distress noted. Steady gait to exit with all belongings.  ?

## 2022-04-28 NOTE — Telephone Encounter (Signed)
Pt is on seroquel and trazodone. He went to ER and was put on these two meds. He has become very agitated. His wife wants to know what to do. If something can be increased to help him. He is on lexapro too. She doesn't know what to do and who to turn to.

## 2022-04-28 NOTE — Discharge Instructions (Signed)
Follow-up with [facility].  In discussion with Dr. Jeffie Pollock he felt that since he is very asymptomatic this is most likely just contamination with the Pseudomonas and that no urgency at this time to treat.  It is very resistant and would require IV antibiotics either at home or in the hospital if it is treated.  But again his opinion was that it did not have to be treated at this point in time.  Return for any new or worse symptoms.

## 2022-04-30 LAB — URINE CULTURE: Culture: >=100000 — AB

## 2022-05-01 ENCOUNTER — Telehealth (HOSPITAL_BASED_OUTPATIENT_CLINIC_OR_DEPARTMENT_OTHER): Payer: Self-pay | Admitting: *Deleted

## 2022-05-01 DIAGNOSIS — R339 Retention of urine, unspecified: Secondary | ICD-10-CM | POA: Diagnosis not present

## 2022-05-01 NOTE — Telephone Encounter (Signed)
Post ED Visit - Positive Culture Follow-up  Culture report reviewed by antimicrobial stewardship pharmacist: [facility] Team '[]'$  Elenor Quinones, Pharm.D. '[]'$  Heide Guile, Pharm.D., BCPS AQ-ID '[]'$  Parks Neptune, Pharm.D., BCPS '[]'$  Alycia Rossetti, Pharm.D., BCPS '[]'$  [place], Pharm.D., BCPS, AAHIVP '[]'$  Legrand Como, Pharm.D., BCPS, AAHIVP '[]'$  Salome Arnt, PharmD, BCPS '[]'$  Johnnette Gourd, PharmD, BCPS '[]'$  Hughes Better, PharmD, BCPS '[]'$  Leeroy Cha, PharmD '[]'$  Laqueta Linden, PharmD, BCPS '[]'$  Albertina Parr, PharmD  [facility] Team '[]'$  Leodis Sias, PharmD '[]'$  Lindell Spar, PharmD '[]'$  Royetta Asal, PharmD '[]'$  Graylin Shiver, Rph '[]'$  Rema Fendt) Glennon Mac, PharmD '[]'$  Arlyn Dunning, PharmD '[]'$  Netta Cedars, PharmD '[]'$  Dia Sitter, PharmD '[]'$  Leone Haven, PharmD '[]'$  Gretta Arab, PharmD '[]'$  Theodis Shove, PharmD '[]'$  Peggyann Juba, PharmD '[]'$  Reuel Boom, PharmD   Positive urine culture Has F/U with [facility] for 05/01/2022.  Defer further care to [facility] and no further patient follow-up is required at this time.  Cherlynn June, PAC  Francisco Leon, Francisco Leon 05/01/2022, 1:02 PM

## 2022-05-01 NOTE — Progress Notes (Signed)
ED Antimicrobial Stewardship Positive Culture Follow Up   Francisco Leon is an 89 y.o. male who presented to [facility] on 04/28/2022 with a chief complaint of  Chief Complaint  Patient presents with   Cystitis    Recent Results (from the past 720 hour(s))  Urine Culture     Status: Abnormal   Collection Time: 04/28/22  6:48 PM   Specimen: Urine, Catheterized  Result Value Ref Range Status   Specimen Description   Final    URINE, CATHETERIZED Performed at [facility], [address], [place], [place] [address]    Special Requests   Final    NONE Performed at [facility], [address], [place], [place] [address]    Culture >=100,000 COLONIES/mL PSEUDOMONAS AERUGINOSA (A)  Final   Report Status 04/30/2022 FINAL  Final   Organism ID, Bacteria PSEUDOMONAS AERUGINOSA (A)  Final      Susceptibility   Pseudomonas aeruginosa - MIC*    CEFTAZIDIME <=1 SENSITIVE Sensitive     CIPROFLOXACIN 2 RESISTANT Resistant     GENTAMICIN <=1 SENSITIVE Sensitive     IMIPENEM 2 SENSITIVE Sensitive     PIP/TAZO <=4 SENSITIVE Sensitive     CEFEPIME 0.5 SENSITIVE Sensitive     * >=100,000 COLONIES/mL PSEUDOMONAS AERUGINOSA    Patient discharged originally without antimicrobial agent. ED provider discussed care with [facility] provider on call, Dr. Jeffie Pollock, who recommended to not treat with antibiotics and to discharge patient home and that [facility] would follow-up with patient and set up IV antibiotic therapy at home, if indicated. Pt has an appointment at 13:30 with [facility] on 05/01/22. Culture discussed with ED provider and decision has been made to defer further care to [facility].  New antibiotic prescription: deferred to [facility]  ED Provider: Cherlynn June, PA-C   Kaleen Mask 05/01/2022, 11:08 AM Clinical Pharmacist Monday - Friday phone -  [phone] Saturday - Sunday phone - [phone]

## 2022-05-02 ENCOUNTER — Ambulatory Visit (INDEPENDENT_AMBULATORY_CARE_PROVIDER_SITE_OTHER): Payer: Medicare Other | Admitting: Physician Assistant

## 2022-05-02 ENCOUNTER — Encounter: Payer: Self-pay | Admitting: Physician Assistant

## 2022-05-02 ENCOUNTER — Telehealth: Payer: Self-pay

## 2022-05-02 VITALS — BP 100/63 | HR 81

## 2022-05-02 DIAGNOSIS — F03918 Unspecified dementia, unspecified severity, with other behavioral disturbance: Secondary | ICD-10-CM | POA: Diagnosis not present

## 2022-05-02 NOTE — Progress Notes (Signed)
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 RH male diabetes, gait instability, peripheral neuropathy, BPH with recurrent UTI,  hearing loss, seen today in follow up for memory loss.  Most recent  01/2022 CT of the brain, personally reviewed  was remarkable for significant atrophy and small vessel ischemic disease.  Francisco Leon is no longer on antidementia medications as the risk outweigh the benefit due to advanced disease and age.  Memory is worse, especially since his last hospitalization in October 2023 for Sepsis due to  Pseudomonas UTI and PNA requiring IV antibiotics complicated with acute  encephalopathy, ABL, Hypernatremia.  He is receiving PT for strength and balance although his  wife reports that his overall status is declined. HE has intermittent period of  mild lucidity and lethargy, as well as sole periods of irritability, despite being on 3 different mood meds. It is suspected that his mood changes may be exacerbated by his cognitive decline. He is on palliative care and he has Social Work on Mining engineer. Discussed with his wife the Francisco Leon current status, unclear of his prognosis at this time as he can still feed himself and being able to perform very simple tasks. He uses a walker at times but has difficulty ambulating, at risk for falls.     Follow up in 6 months. Continue to control mood as per Palliative Care/PCP, he is on 2different mood meds including Seroquel, Lexapro and for sleep he is on Desyrel .Memory Care would be the ideal setting for him given his current cognitive status, although his wife would prefer to have him at home comfortable, and with the support of HHN (" been together for 66 years"). Palliative Care and Social Work on board.  He is very hard of hearing, unfortunately not improved despite hearing aids in the past  No further antidementia  medication is indicated given his advanced disease  Continue PT for strength and balance, suspect that cognitive decline may be playing a role in his mobility     Subjective:    This Francisco Leon is accompanied in the office by his wife and HHN who supplement  the history.  Previous records as well as any outside records available were reviewed prior to todays visit. Francisco Leon was last seen on     Any changes in memory since last visit? "Yes, al lot "-wife says. Sometimes remembers conversations and names of people, it comes and goes, but this is worse after the rehab, he did not get back to where he was" ." Body is stronger than his mind " repeats oneself?  Not a lot.  His comfort word  is "OK" which he repeats.  Disoriented when  in a room?  "In the afternoons he wants to go home, and then realizes he is home, now comes and goes" Leaving objects in unusual places?" "He wants to leave his wheelchair in the foyer", however, he knows where his wallet is -he says.  Wandering behavior?  denies   Any personality changes since last visit? "He may have moments of irritability when he is aware of his condition". Any worsening depression?:  denies   Hallucinations or paranoia?  His wife reports that he states  "His people are coming to take him home ". "He gets upset when people talk in front of him, he thinks is about him, worse when he feels he is being ignored" Seizures?  denies    Any sleep changes?  Denies vivid dreams, REM behavior or sleepwalking   Sleep apnea?   denies   Any hygiene concerns? denies   Independent of bathing and dressing?Needs assistance, HHN helps  Does the Francisco Leon needs help with medications? Wife and HHN is in charge   Who is in charge of the finances?  Family is in charge     Any changes in appetite?  "Appetite is not great" , eats 2 meals a day, especially his banana, but does not want to eat as much as before Francisco Leon have trouble swallowing?  denies   Does the Francisco Leon cook?   No   Any headaches?   denies   Recent falls or head injuries? 01/2022  mechanical fall, no LOC   Unilateral weakness, numbness or tingling? denies   Any tremors? vnot worse.  Any anosmia?  Decreased sense of smell, chronic Any incontinence of urine? indwelling catheter, with recent hematuria,  followed by Urology Any bowel dysfunction?   denies      Francisco Leon lives  with spouse and 2 grown children 47 and 19  Has In [place] 6 h a day, considering memory care. ( Social Work Insurance claims handler involved).  Does the Francisco Leon drive?no longer drives    PREVIOUS MEDICATIONS: memantine  CURRENT MEDICATIONS:  Outpatient Encounter Medications as of 05/02/2022  Medication Sig   atorvastatin (LIPITOR) 10 MG tablet Take 10 mg by mouth at bedtime.   carboxymethylcellulose (REFRESH PLUS) 0.5 % SOLN 1 drop 3 (three) times daily as needed (dry eyes).   escitalopram (LEXAPRO) 10 MG tablet Take 1 tablet (10 mg total) by mouth at bedtime.   finasteride (PROSCAR) 5 MG tablet Take 1 tablet (5 mg total) by mouth daily. For massive prostate and urinary bleeding.   FREESTYLE LITE test strip 1 each daily.   glucose blood (FREESTYLE LITE) test strip USE 1 STRIP TO TEST BLOOD SUGAR DAILY   Guaifenesin (MUCINEX MAXIMUM STRENGTH) 1200 MG TB12 Take 1,200 mg by mouth daily.   insulin aspart (NOVOLOG) 100 UNIT/ML injection Inject 0-6 Units into the skin 3 (three) times daily with meals.   Lancets (FREESTYLE) lancets daily.   levocetirizine (XYZAL) 5 MG tablet Take 1 tablet by mouth every evening.   losartan (COZAAR) 50 MG tablet Take 50 mg by mouth at bedtime.   melatonin 3 MG TABS tablet Take 1 tablet (3 mg total) by mouth at bedtime.   metFORMIN (GLUCOPHAGE) 500 MG tablet Take 500 mg by mouth daily with supper.   polyethylene glycol (MIRALAX / GLYCOLAX) 17 g packet Take 17 g by mouth daily.   QUEtiapine (SEROQUEL) 25 MG tablet Take 1 tablet (25 mg total) by mouth at bedtime.   sodium chloride (MURO 128) 2 % ophthalmic  solution Place 1 drop into both eyes at bedtime.   tamsulosin (FLOMAX) 0.4 MG CAPS capsule 0.4 mg in the morning and at bedtime.   traZODone (DESYREL) 50 MG tablet Take 1 tablet (50 mg total) by mouth at bedtime as needed for sleep.   insulin aspart (NOVOLOG) 100 UNIT/ML injection Inject 0-5 Units into the skin at bedtime. (Francisco Leon not taking: Reported on 05/02/2022)   [DISCONTINUED] ipratropium-albuterol (DUONEB) 0.5-2.5 (3) MG/3ML SOLN Take 3 mLs by nebulization every 4 (four) hours as needed.   [DISCONTINUED] memantine (NAMENDA TITRATION PACK) tablet pack As directed on pack   [DISCONTINUED] senna-docusate (SENOKOT-S) 8.6-50 MG tablet Take 1 tablet by mouth at bedtime as needed for moderate constipation.   No facility-administered  encounter medications on file as of 05/02/2022.        No data to display             No data to display          Objective:     PHYSICAL EXAMINATION:    VITALS:   Vitals:   05/02/22 1434  BP: 100/63  Pulse: 81  SpO2: 95%    GEN:  The Francisco Leon appears stated age and is in NAD. HEENT:  Normocephalic, atraumatic.   Neurological examination:  General: NAD, well-groomed, appears stated age. Orientation: The Francisco Leon is awake with intermittent periods of somnolence, closes his eyes.  Cranial nerves: There is good facial symmetry.The speech is clear bur he is non conversant .Marland Kitchen No aphasia or dysarthria. Fund of knowledge is reduced. Recent and remote memory are impaired. Attention and concentration are reduced. Unable to name objects and repeat phrases.  Hearing is reduced to conversational tone.    Sensation: Sensation is intact to light touch throughout Motor: Strength is at least antigravity x4  DTR's1/4 in UE/LE     Movement examination: Tone: There is normal tone in the UE/LE Abnormal movements:  no tremor.  No myoclonus.  No asterixis.   Coordination:  Unable to test  RAM's and FTN as Francisco Leon closes his eyes  Gait and Station: The Francisco Leon  has difficulty arising out of a deep-seated chair unable to test gait, he is on wheelchair.    Thank you for allowing [place] the opportunity to participate in the care of this nice Francisco Leon. Please do not hesitate to contact us for any questions or concerns.   Total time spent on today's visit was 40 minutes dedicated to this Francisco Leon today, preparing to see Francisco Leon, examining the Francisco Leon, ordering tests and/or medications and counseling the Francisco Leon, documenting clinical information in the EHR or other health record, independently interpreting results and communicating results to the Francisco Leon/family, discussing treatment and goals, answering Francisco Leon's questions and coordinating care.  Cc:  Collene Leyden, MD  Sharene Butters 05/02/2022 8:34 PM

## 2022-05-02 NOTE — Patient Instructions (Addendum)
It was a pleasure to see you today at our office.   Recommendations:  Folow up in 6 month    Whom to call:  Memory  decline, memory medications: Call our office [phone]   For psychiatric meds, mood meds: Please have your primary care physician manage these medications.       For assessment of decision of mental capacity and competency:  Call Dr. Anthoney Harada, geriatric psychiatrist at [phone]  For guidance in geriatric dementia issues please call [facility] [phone]  For guidance regarding [facility] and if placement were needed at the facility, contact Arnell Asal, Social Worker tel: [phone]  If you have any severe symptoms of a stroke, or other severe issues such as confusion,severe chills or fever, etc call 911 or go to the ER as you may need to be evaluated further   Feel free to visit Facebook page " [email or web address]" for tips of how to care for people with memory problems.     RECOMMENDATIONS FOR ALL PATIENTS WITH MEMORY PROBLEMS: 1. Continue to exercise (Recommend 30 minutes of walking everyday, or 3 hours every week) 2. Increase social interactions - continue going to [place] and enjoy social gatherings with friends and family 3. Eat healthy, avoid fried foods and eat more fruits and vegetables 4. Maintain adequate blood pressure, blood sugar, and blood cholesterol level. Reducing the risk of stroke and cardiovascular disease also helps promoting better memory. 5. Avoid stressful situations. Live a simple life and avoid aggravations. Organize your time and prepare for the next day in anticipation. 6. Sleep well, avoid any interruptions of sleep and avoid any distractions in the bedroom that may interfere with adequate sleep quality 7. Avoid sugar, avoid sweets as there is a strong link between excessive sugar intake, diabetes, and cognitive impairment We discussed the Mediterranean diet, which has been shown to help patients  reduce the risk of progressive memory disorders and reduces cardiovascular risk. This includes eating fish, eat fruits and green leafy vegetables, nuts like almonds and hazelnuts, walnuts, and also use olive oil. Avoid fast foods and fried foods as much as possible. Avoid sweets and sugar as sugar use has been linked to worsening of memory function.  There is always a concern of gradual progression of memory problems. If this is the case, then we may need to adjust level of care according to patient needs. Support, both to the patient and caregiver, should then be put into place.    The Alzheimer's Association is here all day, every day for people facing Alzheimer's disease through our free 24/7 Helpline: [phone])[phone]. The Helpline provides reliable information and support to all those who need assistance, such as individuals living with memory loss, Alzheimer's or other dementia, caregivers, health care professionals and the public.  Our highly trained and knowledgeable staff can help you with: Understanding memory loss, dementia and Alzheimer's  Medications and other treatment options  General information about aging and brain health  Skills to provide quality care and to find the best care from professionals  Legal, financial and living-arrangement decisions Our Helpline also features: Confidential care consultation provided by master's level clinicians who can help with decision-making support, crisis assistance and education on issues families face every day  Help in a caller's preferred language using our translation service that features more than 200 languages and dialects  Referrals to local community programs, services and ongoing support     FALL PRECAUTIONS: Be cautious when walking. Scan the area  for obstacles that may increase the risk of trips and falls. When getting up in the mornings, sit up at the edge of the bed for a few minutes before getting out of bed. Consider elevating  the bed at the head end to avoid drop of blood pressure when getting up. Walk always in a well-lit room (use night lights in the walls). Avoid area rugs or power cords from appliances in the middle of the walkways. Use a walker or a cane if necessary and consider physical therapy for balance exercise. Get your eyesight checked regularly.  FINANCIAL OVERSIGHT: Supervision, especially oversight when making financial decisions or transactions is also recommended.  HOME SAFETY: Consider the safety of the kitchen when operating appliances like stoves, microwave oven, and blender. Consider having supervision and share cooking responsibilities until no longer able to participate in those. Accidents with firearms and other hazards in the house should be identified and addressed as well.   ABILITY TO BE LEFT ALONE: If patient is unable to contact 911 operator, consider using LifeLine, or when the need is there, arrange for someone to stay with patients. Smoking is a fire hazard, consider supervision or cessation. Risk of wandering should be assessed by caregiver and if detected at any point, supervision and safe proof recommendations should be instituted.  MEDICATION SUPERVISION: Inability to self-administer medication needs to be constantly addressed. Implement a mechanism to ensure safe administration of the medications.        Mediterranean Diet A Mediterranean diet refers to food and lifestyle choices that are based on the traditions of countries located on the [employer]. This way of eating has been shown to help prevent certain conditions and improve outcomes for people who have chronic diseases, like kidney disease and heart disease. What are tips for following this plan? Lifestyle  Cook and eat meals together with your family, when possible. Drink enough fluid to keep your urine clear or pale yellow. Be physically active every day. This includes: Aerobic exercise like running or  swimming. Leisure activities like gardening, walking, or housework. Get 7-8 hours of sleep each night. If recommended by your health care provider, drink red wine in moderation. This means 1 glass a day for nonpregnant women and 2 glasses a day for men. A glass of wine equals 5 oz (150 mL). Reading food labels  Check the serving size of packaged foods. For foods such as rice and pasta, the serving size refers to the amount of cooked product, not dry. Check the total fat in packaged foods. Avoid foods that have saturated fat or trans fats. Check the ingredients list for added sugars, such as corn syrup. Shopping  At the grocery store, buy most of your food from the areas near the walls of the store. This includes: Fresh fruits and vegetables (produce). Grains, beans, nuts, and seeds. Some of these may be available in unpackaged forms or large amounts (in bulk). Fresh seafood. Poultry and eggs. Low-fat dairy products. Buy whole ingredients instead of prepackaged foods. Buy fresh fruits and vegetables in-season from local farmers markets. Buy frozen fruits and vegetables in resealable bags. If you do not have access to quality fresh seafood, buy precooked frozen shrimp or canned fish, such as tuna, salmon, or sardines. Buy small amounts of raw or cooked vegetables, salads, or olives from the deli or salad bar at your store. Stock your pantry so you always have certain foods on hand, such as olive oil, canned tuna, canned tomatoes, rice, pasta, and  beans. Cooking  Cook foods with extra-virgin olive oil instead of using butter or other vegetable oils. Have meat as a side dish, and have vegetables or grains as your main dish. This means having meat in small portions or adding small amounts of meat to foods like pasta or stew. Use beans or vegetables instead of meat in common dishes like chili or lasagna. Experiment with different cooking methods. Try roasting or broiling vegetables instead of  steaming or sauteing them. Add frozen vegetables to soups, stews, pasta, or rice. Add nuts or seeds for added healthy fat at each meal. You can add these to yogurt, salads, or vegetable dishes. Marinate fish or vegetables using olive oil, lemon juice, garlic, and fresh herbs. Meal planning  Plan to eat 1 vegetarian meal one day each week. Try to work up to 2 vegetarian meals, if possible. Eat seafood 2 or more times a week. Have healthy snacks readily available, such as: Vegetable sticks with hummus. Greek yogurt. Fruit and nut trail mix. Eat balanced meals throughout the week. This includes: Fruit: 2-3 servings a day Vegetables: 4-5 servings a day Low-fat dairy: 2 servings a day Fish, poultry, or lean meat: 1 serving a day Beans and legumes: 2 or more servings a week Nuts and seeds: 1-2 servings a day Whole grains: 6-8 servings a day Extra-virgin olive oil: 3-4 servings a day Limit red meat and sweets to only a few servings a month What are my food choices? Mediterranean diet Recommended Grains: Whole-grain pasta. Brown rice. Bulgar wheat. Polenta. Couscous. Whole-wheat bread. [place]. Vegetables: Artichokes. Beets. Broccoli. Cabbage. Carrots. Eggplant. Green beans. Chard. Kale. Spinach. Onions. Leeks. Peas. Squash. Tomatoes. Peppers. Radishes. Fruits: Apples. Apricots. Avocado. Berries. Bananas. Cherries. Dates. Figs. Grapes. Lemons. Melon. Oranges. Peaches. Plums. Pomegranate. Meats and other protein foods: Beans. Almonds. Sunflower seeds. Pine nuts. Peanuts. [place]. Salmon. Scallops. Shrimp. [place]. Tilapia. Clams. Oysters. Eggs. Dairy: Low-fat milk. Cheese. Greek yogurt. Beverages: Water. Red wine. Herbal tea. Fats and oils: Extra virgin olive oil. Avocado oil. Grape seed oil. Sweets and desserts: [place] yogurt with honey. Baked apples. Poached pears. Trail mix. Seasoning and other foods: Basil. Cilantro. Coriander. Cumin. Mint. Parsley. Sage. Rosemary. Tarragon. Garlic.  Oregano. Thyme. Pepper. Balsalmic vinegar. Tahini. Hummus. Tomato sauce. Olives. Mushrooms. Limit these Grains: Prepackaged pasta or rice dishes. Prepackaged cereal with added sugar. Vegetables: Deep fried potatoes (french fries). Fruits: Fruit canned in syrup. Meats and other protein foods: Beef. Pork. Lamb. Poultry with skin. Hot dogs. [place] Salines. Dairy: Ice cream. Sour cream. Whole milk. Beverages: Juice. Sugar-sweetened soft drinks. Beer. Liquor and spirits. Fats and oils: Butter. Canola oil. Vegetable oil. Beef fat (tallow). Lard. Sweets and desserts: Cookies. Cakes. Pies. Candy. Seasoning and other foods: Mayonnaise. Premade sauces and marinades. The items listed may not be a complete list. Talk with your dietitian about what dietary choices are right for you. Summary The Mediterranean diet includes both food and lifestyle choices. Eat a variety of fresh fruits and vegetables, beans, nuts, seeds, and whole grains. Limit the amount of red meat and sweets that you eat. Talk with your health care provider about whether it is safe for you to drink red wine in moderation. This means 1 glass a day for nonpregnant women and 2 glasses a day for men. A glass of wine equals 5 oz (150 mL). This information is not intended to replace advice given to you by your health care provider. Make sure you discuss any questions you have with your health care provider. Document  Released: 11/29/2015 Document Revised: 01/01/2016 Document Reviewed: 11/29/2015 Elsevier Interactive Patient Education  2017 [employer].

## 2022-05-02 NOTE — Telephone Encounter (Signed)
1024 Palliative Care Note  RN called to schedule next f/u visit. Appt scheduled for 05/12/22 at 12p.   Jacqulyn Cane, RN

## 2022-05-06 ENCOUNTER — Other Ambulatory Visit: Payer: Self-pay

## 2022-05-06 ENCOUNTER — Encounter (HOSPITAL_COMMUNITY): Payer: Self-pay

## 2022-05-06 ENCOUNTER — Other Ambulatory Visit (HOSPITAL_COMMUNITY): Payer: Self-pay

## 2022-05-06 ENCOUNTER — Emergency Department (HOSPITAL_COMMUNITY): Payer: Medicare Other

## 2022-05-06 ENCOUNTER — Emergency Department (HOSPITAL_COMMUNITY)
Admission: EM | Admit: 2022-05-06 | Discharge: 2022-05-06 | Disposition: A | Discharge status: Home / Self Care | Payer: Medicare Other | Attending: Emergency Medicine | Admitting: Emergency Medicine

## 2022-05-06 DIAGNOSIS — Z794 Long term (current) use of insulin: Secondary | ICD-10-CM | POA: Diagnosis not present

## 2022-05-06 DIAGNOSIS — I469 Cardiac arrest, cause unspecified: Secondary | ICD-10-CM | POA: Diagnosis not present

## 2022-05-06 DIAGNOSIS — R4182 Altered mental status, unspecified: Secondary | ICD-10-CM | POA: Diagnosis not present

## 2022-05-06 DIAGNOSIS — Z79899 Other long term (current) drug therapy: Secondary | ICD-10-CM | POA: Diagnosis not present

## 2022-05-06 DIAGNOSIS — I10 Essential (primary) hypertension: Secondary | ICD-10-CM | POA: Diagnosis not present

## 2022-05-06 DIAGNOSIS — D72829 Elevated white blood cell count, unspecified: Secondary | ICD-10-CM | POA: Diagnosis not present

## 2022-05-06 DIAGNOSIS — Z7984 Long term (current) use of oral hypoglycemic drugs: Secondary | ICD-10-CM | POA: Insufficient documentation

## 2022-05-06 DIAGNOSIS — Z209 Contact with and (suspected) exposure to unspecified communicable disease: Secondary | ICD-10-CM | POA: Diagnosis not present

## 2022-05-06 DIAGNOSIS — G319 Degenerative disease of nervous system, unspecified: Secondary | ICD-10-CM | POA: Diagnosis not present

## 2022-05-06 DIAGNOSIS — R42 Dizziness and giddiness: Secondary | ICD-10-CM | POA: Insufficient documentation

## 2022-05-06 DIAGNOSIS — N3000 Acute cystitis without hematuria: Secondary | ICD-10-CM | POA: Insufficient documentation

## 2022-05-06 DIAGNOSIS — E876 Hypokalemia: Secondary | ICD-10-CM | POA: Diagnosis not present

## 2022-05-06 DIAGNOSIS — E119 Type 2 diabetes mellitus without complications: Secondary | ICD-10-CM | POA: Diagnosis not present

## 2022-05-06 DIAGNOSIS — R5383 Other fatigue: Secondary | ICD-10-CM | POA: Diagnosis not present

## 2022-05-06 DIAGNOSIS — F0390 Unspecified dementia without behavioral disturbance: Secondary | ICD-10-CM | POA: Diagnosis not present

## 2022-05-06 DIAGNOSIS — Z7401 Bed confinement status: Secondary | ICD-10-CM | POA: Diagnosis not present

## 2022-05-06 DIAGNOSIS — R410 Disorientation, unspecified: Secondary | ICD-10-CM | POA: Diagnosis not present

## 2022-05-06 DIAGNOSIS — R404 Transient alteration of awareness: Secondary | ICD-10-CM | POA: Diagnosis not present

## 2022-05-06 LAB — CBC WITH DIFFERENTIAL/PLATELET
Abs Immature Granulocytes: 0.05 10*3/uL (ref 0.00–0.07)
Basophils Absolute: 0 10*3/uL (ref 0.0–0.1)
Basophils Relative: 0 %
Eosinophils Absolute: 0.1 10*3/uL (ref 0.0–0.5)
Eosinophils Relative: 1 %
HCT: 37.6 % — ABNORMAL LOW (ref 39.0–52.0)
Hemoglobin: 12.1 g/dL — ABNORMAL LOW (ref 13.0–17.0)
Immature Granulocytes: 1 %
Lymphocytes Relative: 11 %
Lymphs Abs: 1.2 10*3/uL (ref 0.7–4.0)
MCH: 26.4 pg (ref 26.0–34.0)
MCHC: 32.2 g/dL (ref 30.0–36.0)
MCV: 81.9 fL (ref 80.0–100.0)
Monocytes Absolute: 1.2 10*3/uL — ABNORMAL HIGH (ref 0.1–1.0)
Monocytes Relative: 11 %
Neutro Abs: 8.4 10*3/uL — ABNORMAL HIGH (ref 1.7–7.7)
Neutrophils Relative %: 76 %
Platelets: 334 10*3/uL (ref 150–400)
RBC: 4.59 MIL/uL (ref 4.22–5.81)
RDW: 14.7 % (ref 11.5–15.5)
WBC: 10.9 10*3/uL — ABNORMAL HIGH (ref 4.0–10.5)
nRBC: 0 % (ref 0.0–0.2)

## 2022-05-06 LAB — URINALYSIS, ROUTINE W REFLEX MICROSCOPIC
Bilirubin Urine: NEGATIVE
Glucose, UA: NEGATIVE mg/dL
Ketones, ur: NEGATIVE mg/dL
Nitrite: NEGATIVE
Protein, ur: 100 mg/dL — AB
RBC / HPF: >50 RBC/hpf — ABNORMAL HIGH (ref 0–5)
Specific Gravity, Urine: 1.012 (ref 1.005–1.030)
WBC, UA: >50 WBC/hpf — ABNORMAL HIGH (ref 0–5)
pH: 6 (ref 5.0–8.0)

## 2022-05-06 LAB — COMPREHENSIVE METABOLIC PANEL
ALT: 12 U/L (ref 0–44)
AST: 12 U/L — ABNORMAL LOW (ref 15–41)
Albumin: 3.6 g/dL (ref 3.5–5.0)
Alkaline Phosphatase: 94 U/L (ref 38–126)
Anion gap: 11 (ref 5–15)
BUN: 13 mg/dL (ref 8–23)
CO2: 24 mmol/L (ref 22–32)
Calcium: 10.4 mg/dL — ABNORMAL HIGH (ref 8.9–10.3)
Chloride: 99 mmol/L (ref 98–111)
Creatinine, Ser: 0.77 mg/dL (ref 0.61–1.24)
GFR, Estimated: >60 mL/min (ref >60)
Glucose, Bld: 147 mg/dL — ABNORMAL HIGH (ref 70–99)
Potassium: 3.2 mmol/L — ABNORMAL LOW (ref 3.5–5.1)
Sodium: 134 mmol/L — ABNORMAL LOW (ref 135–145)
Total Bilirubin: 1.2 mg/dL (ref 0.3–1.2)
Total Protein: 7 g/dL (ref 6.5–8.1)

## 2022-05-06 LAB — LACTIC ACID, PLASMA: Lactic Acid, Venous: 1 mmol/L (ref 0.5–1.9)

## 2022-05-06 LAB — LIPASE, BLOOD: Lipase: 38 U/L (ref 11–51)

## 2022-05-06 MED ORDER — GENTAMICIN SULFATE 40 MG/ML IJ SOLN
5.0000 mg/kg | Freq: Once | INTRAVENOUS | Status: AC
  Administered 2022-05-06: 340 mg via INTRAVENOUS
  Filled 2022-05-06: qty 8.5

## 2022-05-06 MED ORDER — GENTAMICIN SULFATE 40 MG/ML IJ SOLN: 5.0000 mg/kg | Freq: Once | INTRAVENOUS | Status: DC

## 2022-05-06 MED ORDER — LORAZEPAM 2 MG/ML IJ SOLN
0.5000 mg | Freq: Once | INTRAMUSCULAR | Status: AC
  Administered 2022-05-06: 0.5 mg via INTRAVENOUS
  Filled 2022-05-06: qty 1

## 2022-05-06 MED ORDER — SODIUM CHLORIDE 0.9 % IV BOLUS
500.0000 mL | Freq: Once | INTRAVENOUS | Status: AC
  Administered 2022-05-06: 500 mL via INTRAVENOUS

## 2022-05-06 NOTE — Discharge Instructions (Addendum)
Your history, exam, workup today are consistent with vertigo and dizziness after the MRI did not show acute stroke.  I suspect you were slightly dehydrated and you got some fluids.  Given your recent urinalysis and culture showing Pseudomonas, we agreed together to treat you with a dose of IV gentamicin as recommended by pharmacy and discharged home to follow-up with your urology and PCP teams.  As you just had your Foley changed several days ago we will hold on exchange at this time.  Please rest and stay hydrated and follow-up with your outpatient teams.

## 2022-05-06 NOTE — ED Provider Notes (Signed)
[facility] DEPT Provider Note   CSN: [id number] Arrival date & time: 05/06/22  1226     History  Chief Complaint  Patient presents with   Dizziness    Francisco Leon is a 89 y.o. male.  The history is provided by the patient and medical records. No language interpreter was used.  Dizziness Quality:  Lightheadedness, vertigo, imbalance and room spinning Severity:  Moderate Onset quality:  Gradual Duration:  3 days Timing:  Constant Progression:  Waxing and waning Chronicity:  Recurrent Relieved by:  Nothing Worsened by:  Nothing Ineffective treatments:  None tried Associated symptoms: no chest pain, no diarrhea, no headaches, no nausea, no palpitations, no shortness of breath, no vision changes, no vomiting and no weakness        Home Medications Prior to Admission medications   Medication Sig Start Date End Date Taking? Authorizing Provider  atorvastatin (LIPITOR) 10 MG tablet Take 10 mg by mouth at bedtime. 08/05/21   [provider]  carboxymethylcellulose (REFRESH PLUS) 0.5 % SOLN 1 drop 3 (three) times daily as needed (dry eyes).    [provider]  escitalopram (LEXAPRO) 10 MG tablet Take 1 tablet (10 mg total) by mouth at bedtime. 02/25/22   Terrilee Croak, MD  finasteride (PROSCAR) 5 MG tablet Take 1 tablet (5 mg total) by mouth daily. For massive prostate and urinary bleeding. 02/15/22   Alexis Frock, MD  FREESTYLE LITE test strip 1 each daily. 08/07/21   [provider]  glucose blood (FREESTYLE LITE) test strip USE 1 STRIP TO TEST BLOOD SUGAR DAILY 07/27/15   [provider]  Guaifenesin (MUCINEX MAXIMUM STRENGTH) 1200 MG TB12 Take 1,200 mg by mouth daily.    [provider]  insulin aspart (NOVOLOG) 100 UNIT/ML injection Inject 0-6 Units into the skin 3 (three) times daily with meals. 02/25/22   Dahal, Marlowe Aschoff, MD  insulin aspart (NOVOLOG) 100 UNIT/ML injection Inject 0-5 Units into the  skin at bedtime. Patient not taking: Reported on 05/02/2022 02/25/22   Terrilee Croak, MD  Lancets (FREESTYLE) lancets daily. 07/12/21   [provider]  levocetirizine (XYZAL) 5 MG tablet Take 1 tablet by mouth every evening. 10/01/15   [provider]  losartan (COZAAR) 50 MG tablet Take 50 mg by mouth at bedtime. 08/20/21   [provider]  melatonin 3 MG TABS tablet Take 1 tablet (3 mg total) by mouth at bedtime. 02/25/22   Terrilee Croak, MD  metFORMIN (GLUCOPHAGE) 500 MG tablet Take 500 mg by mouth daily with supper. 06/08/21   [provider]  polyethylene glycol (MIRALAX / GLYCOLAX) 17 g packet Take 17 g by mouth daily.    [provider]  QUEtiapine (SEROQUEL) 25 MG tablet Take 1 tablet (25 mg total) by mouth at bedtime. 02/25/22   Dahal, Marlowe Aschoff, MD  sodium chloride (MURO 128) 2 % ophthalmic solution Place 1 drop into both eyes at bedtime.    [provider]  tamsulosin (FLOMAX) 0.4 MG CAPS capsule 0.4 mg in the morning and at bedtime. 08/15/15   [provider]  traZODone (DESYREL) 50 MG tablet Take 1 tablet (50 mg total) by mouth at bedtime as needed for sleep. 02/25/22   Terrilee Croak, MD      Allergies    Dutasteride, Lisinopril, and Sulfa antibiotics    Review of Systems   Review of Systems  Constitutional:  Negative for chills, diaphoresis, fatigue and fever.  HENT:  Negative for congestion.  Eyes:  Negative for visual disturbance.  Respiratory:  Negative for cough, chest tightness, shortness of breath and wheezing.   Cardiovascular:  Negative for chest pain and palpitations.  Gastrointestinal:  Negative for abdominal pain, constipation, diarrhea, nausea and vomiting.  Genitourinary:  Negative for dysuria and flank pain.       Foley in place  Musculoskeletal:  Negative for back pain, neck pain and neck stiffness.  Skin:  Negative for rash and wound.  Neurological:  Positive for dizziness and light-headedness. Negative for  weakness, numbness and headaches.  Psychiatric/Behavioral:  Negative for agitation and confusion.   All other systems reviewed and are negative.   Physical Exam Updated Vital Signs BP 122/62   Pulse 77   Temp 98.3 F (36.8 C)   Resp 20   SpO2 94%  Physical Exam Vitals and nursing note reviewed.  Constitutional:      General: He is not in acute distress.    Appearance: He is well-developed. He is not ill-appearing, toxic-appearing or diaphoretic.  HENT:     Head: Normocephalic and atraumatic.     Nose: No congestion or rhinorrhea.     Mouth/Throat:     Mouth: Mucous membranes are moist.     Pharynx: No oropharyngeal exudate or posterior oropharyngeal erythema.  Eyes:     Extraocular Movements: Extraocular movements intact.     Conjunctiva/sclera: Conjunctivae normal.     Pupils: Pupils are equal, round, and reactive to light.  Cardiovascular:     Rate and Rhythm: Normal rate and regular rhythm.     Heart sounds: No murmur heard. Pulmonary:     Effort: Pulmonary effort is normal. No respiratory distress.     Breath sounds: Normal breath sounds. No wheezing, rhonchi or rales.  Chest:     Chest wall: No tenderness.  Abdominal:     General: Abdomen is flat.     Palpations: Abdomen is soft.     Tenderness: There is no abdominal tenderness. There is no right CVA tenderness, left CVA tenderness, guarding or rebound.  Musculoskeletal:        General: No swelling or tenderness.     Cervical back: Neck supple. No tenderness.     Right lower leg: No edema.     Left lower leg: No edema.  Skin:    General: Skin is warm and dry.     Capillary Refill: Capillary refill takes less than 2 seconds.     Findings: No erythema or rash.  Neurological:     Mental Status: He is alert. Mental status is at baseline.     Sensory: No sensory deficit.     Motor: No weakness.  Psychiatric:        Mood and Affect: Mood normal.     ED Results / Procedures / Treatments   Labs (all labs  ordered are listed, but only abnormal results are displayed) Labs Reviewed  CBC WITH DIFFERENTIAL/PLATELET - Abnormal; Notable for the following components:      Result Value   WBC 10.9 (*)    Hemoglobin 12.1 (*)    HCT 37.6 (*)    Neutro Abs 8.4 (*)    Monocytes Absolute 1.2 (*)    All other components within normal limits  COMPREHENSIVE METABOLIC PANEL - Abnormal; Notable for the following components:   Sodium 134 (*)    Potassium 3.2 (*)    Glucose, Bld 147 (*)    Calcium 10.4 (*)    AST 12 (*)  All other components within normal limits  URINE CULTURE  CULTURE, BLOOD (ROUTINE X 2)  CULTURE, BLOOD (ROUTINE X 2)  RESP PANEL BY RT-PCR (RSV, FLU A&B, COVID)  RVPGX2  LACTIC ACID, PLASMA  LIPASE, BLOOD  LACTIC ACID, PLASMA  URINALYSIS, ROUTINE W REFLEX MICROSCOPIC    EKG None  Radiology MR BRAIN WO CONTRAST  Result Date: 05/06/2022 CLINICAL DATA:  Neuro deficit, acute, stroke suspected Dizziness not responding to meclizine. Confusion and altered mental status in the setting of recent UTI. EXAM: MRI HEAD WITHOUT CONTRAST TECHNIQUE: Multiplanar, multiecho pulse sequences of the brain and surrounding structures were obtained without intravenous contrast. COMPARISON:  CT head October 29, 23. FINDINGS: Brain: No acute infarction, hemorrhage, hydrocephalus, extra-axial collection or mass lesion. Cerebral atrophy. Patchy T2/FLAIR hyperintensities in the white matter, nonspecific but compatible with chronic microvascular ischemic disease. Vascular: Major arterial flow voids are maintained at the skull base. Skull and upper cervical spine: Normal marrow signal. Sinuses/Orbits: Clear sinuses.  No acute orbital findings. Other: No mastoid effusions. IMPRESSION: 1. No evidence of acute intracranial abnormality. 2.  Cerebral Atrophy (ICD10-G31.9). Electronically Signed   By: Margaretha Sheffield M.D.   On: 05/06/2022 15:48   DG Chest 1 View  Result Date: 05/06/2022 CLINICAL DATA:  [id number] Altered  mental status 780.97.ICD-9-CM EXAM: CHEST  1 VIEW COMPARISON:  02/16/2022 chest radiograph. FINDINGS: Stable cardiomediastinal silhouette with borderline mild cardiomegaly. No pneumothorax. New mild blunting of the left costophrenic angle. No right pleural effusion. Similar slight elevation of the left hemidiaphragm. Slightly low lung volumes. Hazy left lung base opacity, favor atelectasis. Cephalization of the pulmonary vasculature without overt pulmonary edema. IMPRESSION: 1. Borderline mild cardiomegaly without overt pulmonary edema. 2. New mild blunting of the left costophrenic angle, cannot exclude small left pleural effusion. 3. Slightly low lung volumes. Hazy left lung base opacity, favor atelectasis. Electronically Signed   By: Ilona Sorrel M.D.   On: 05/06/2022 14:06    Procedures Procedures    Medications Ordered in ED Medications  gentamicin (GARAMYCIN) 340 mg in dextrose 5 % 50 mL IVPB (has no administration in time range)  sodium chloride 0.9 % bolus 500 mL (0 mLs Intravenous Stopped 05/06/22 1625)  LORazepam (ATIVAN) injection 0.5 mg (0.5 mg Intravenous Given 05/06/22 1413)    ED Course/ Medical Decision Making/ A&P                             Medical Decision Making Amount and/or Complexity of Data Reviewed Labs: ordered. Radiology: ordered.  Risk Prescription drug management.    NAJIB COLMENARES is a 89 y.o. male with a past medical history significant for hypertension, hypercholesterolemia, dementia, diabetes, Foley catheter use in the setting of urinary tract infection with reported Pseudomonas colonization but previously asymptomatic so not on antibiotics at this time, and previous vertigo who presents for persistent dizziness and some altered mental status.  According to EMS report and subsequently family who arrived, patient has had persistent dizziness with feeling unsteady and spinning sensation for the last 3 days.  Patient has had vertiginous symptoms in the past but  normally meclizine has helped.  He took that and has not any relief.  Patient was sent from PCP to get a "scan of the head" to rule out stroke or other cause of symptoms.  Patient had recent UTI and has a catheter in place but after urology discussion decision was made not to treat as he was asymptomatic.  Otherwise EMS says that patient is been slightly more confused than baseline for the last few days.  Patient is difficult historian with dementia.  On exam, lungs were clear and chest was nontender.  Abdomen nontender.  Patient had intact sensation and strength in lower extremities and had intact sensation and strength in upper extremities.  Symmetric smile.  Speech was clear but confused.  Pupils are symmetric and reactive with normal extract movements.  Patient appeared to have intact finger-nose-finger testing although he did get confused and tried to grab this examiner's hand and make it touch his nose.  He did not have any nystagmus on my initial exam.  Otherwise patient had dry mucous membranes but was well-appearing.  Clinically I am concerned that the patient has persistent dizziness that seems different than prior with some confusion.  We discussed the possibility of dehydration, smoldering UTI leading to vertiginous symptoms, or even stroke.  We agreed to get MRI of his head and screening labs and give him some fluids.  Will await reassessment after workup to determine disposition.   MRI returned to notice of acute stroke, mass, or other acute abnormality.  Shows chronic atrophy and some chronic small vessel changes.  Chest x-ray showed a small effusion and some mild cardiomegaly but otherwise no evidence of acute pneumonia at this time.  Some atelectasis was present.  I went over these findings with the patient's family as well as the labs.  Patient does have a persistent leukocytosis similar to last time and metabolic panel overall similar with mild hypokalemia.  Patient reportedly loves  bananas and his wife will increase his banana intake.  Lactic acid was normal and he is not septic.  Lipase normal.  We are somewhat concerned that patient does have a smoldering UTI after previous cultures show Pseudomonas greater than 100,000 colonies.  I do suspect this is contributing some of his symptoms.  Wife is amenable to treating with antibiotics now although we will try to send off a urinalysis and culture.  This was ordered.  Wife does not want him admitted and would like an IV medicine.  I spoke with pharmacy who recommended a one-time dose of gentamicin and after this is administered he can go home for follow-up with his urology and PCP teams.  Wife is amenable to this.  She agrees with not exchanging his Foley at this time because it was just changed several days ago and she does not want it redone.  Patient be discharged after gentamicin has been administered.  Patient will be discharged after medications are completed assuming no reaction.        Final Clinical Impression(s) / ED Diagnoses Final diagnoses:  Dizziness  Acute cystitis without hematuria    Rx / DC Orders ED Discharge Orders     None       Clinical Impression: 1. Dizziness   2. Acute cystitis without hematuria     Disposition: Discharge  Condition: Good  I have discussed the results, Dx and Tx plan with the pt(& family if present). He/she/they expressed understanding and agree(s) with the plan. Discharge instructions discussed at great length. Strict return precautions discussed and pt &/or family have verbalized understanding of the instructions. No further questions at time of discharge.    New Prescriptions   No medications on file    Follow Up: No follow-up provider specified.    Jules Vidovich, Gwenyth Allegra, MD 05/06/22 [phone]

## 2022-05-06 NOTE — ED Triage Notes (Addendum)
Patient BIB [facility] from home. Patient is having dizziness. History of vertigo, took meclizine without relief. His doctor said to come get CT scan. Dementia. Recent UTI and has a foley catheter in place.

## 2022-05-08 LAB — URINE CULTURE: Culture: >=100000 — AB

## 2022-05-09 ENCOUNTER — Telehealth: Payer: Self-pay

## 2022-05-09 ENCOUNTER — Telehealth (HOSPITAL_BASED_OUTPATIENT_CLINIC_OR_DEPARTMENT_OTHER): Payer: Self-pay | Admitting: Emergency Medicine

## 2022-05-09 DIAGNOSIS — A419 Sepsis, unspecified organism: Secondary | ICD-10-CM | POA: Diagnosis not present

## 2022-05-09 DIAGNOSIS — R6521 Severe sepsis with septic shock: Secondary | ICD-10-CM | POA: Diagnosis not present

## 2022-05-09 DIAGNOSIS — N401 Enlarged prostate with lower urinary tract symptoms: Secondary | ICD-10-CM | POA: Diagnosis not present

## 2022-05-09 DIAGNOSIS — N138 Other obstructive and reflux uropathy: Secondary | ICD-10-CM | POA: Diagnosis not present

## 2022-05-09 DIAGNOSIS — F0150 Vascular dementia without behavioral disturbance: Secondary | ICD-10-CM | POA: Diagnosis not present

## 2022-05-09 DIAGNOSIS — R338 Other retention of urine: Secondary | ICD-10-CM | POA: Diagnosis not present

## 2022-05-09 NOTE — Telephone Encounter (Signed)
1309 Palliative Care Note  RN called PCP office after they opened back from lunch. Requested order for hospice referral for patient. Contact info given.  Jacqulyn Cane, RN

## 2022-05-09 NOTE — Telephone Encounter (Signed)
Post ED Visit - Positive Culture Follow-up  Culture report reviewed by antimicrobial stewardship pharmacist: [facility] Team '[]'$  Elenor Quinones, Pharm.D. '[x]'$  Heide Guile, Pharm.D., BCPS AQ-ID '[]'$  Parks Neptune, Pharm.D., BCPS '[]'$  Alycia Rossetti, Pharm.D., BCPS '[]'$  [place], Pharm.D., BCPS, AAHIVP '[]'$  Legrand Como, Pharm.D., BCPS, AAHIVP '[]'$  Salome Arnt, PharmD, BCPS '[]'$  Johnnette Gourd, PharmD, BCPS '[]'$  Hughes Better, PharmD, BCPS '[]'$  Leeroy Cha, PharmD '[]'$  Laqueta Linden, PharmD, BCPS '[]'$  Albertina Parr, PharmD  [place] Team '[]'$  Leodis Sias, PharmD '[]'$  Lindell Spar, PharmD '[]'$  Royetta Asal, PharmD '[]'$  Graylin Shiver, Rph '[]'$  Rema Fendt) Glennon Mac, PharmD '[]'$  Arlyn Dunning, PharmD '[]'$  Netta Cedars, PharmD '[]'$  Dia Sitter, PharmD '[]'$  Leone Haven, PharmD '[]'$  Gretta Arab, PharmD '[]'$  Theodis Shove, PharmD '[]'$  Peggyann Juba, PharmD '[]'$  Reuel Boom, PharmD   Positive urine culture Treated with Gentamicin, organism sensitive to the same and no further patient follow-up is required at this time.  Sandi Raveling Abrar Bilton 05/09/2022, 1:03 PM

## 2022-05-09 NOTE — Telephone Encounter (Signed)
1236 Palliative Care Note  RN returned call to wife who reports that pt is declining. States, "he is more and more confused and hard to care for. Home care manager said he may qualify for hospice care." Wife requesting eval for hospice. RN to call PCP office for orders.  Jacqulyn Cane, RN

## 2022-05-10 ENCOUNTER — Other Ambulatory Visit: Payer: Self-pay

## 2022-05-10 ENCOUNTER — Emergency Department (HOSPITAL_COMMUNITY)
Admission: EM | Admit: 2022-05-10 | Discharge: 2022-05-10 | Disposition: A | Discharge status: Home / Self Care | Payer: Medicare Other | Attending: Emergency Medicine | Admitting: Emergency Medicine

## 2022-05-10 DIAGNOSIS — Z7401 Bed confinement status: Secondary | ICD-10-CM | POA: Diagnosis not present

## 2022-05-10 DIAGNOSIS — Z7984 Long term (current) use of oral hypoglycemic drugs: Secondary | ICD-10-CM | POA: Diagnosis not present

## 2022-05-10 DIAGNOSIS — E119 Type 2 diabetes mellitus without complications: Secondary | ICD-10-CM | POA: Insufficient documentation

## 2022-05-10 DIAGNOSIS — Z79899 Other long term (current) drug therapy: Secondary | ICD-10-CM | POA: Diagnosis not present

## 2022-05-10 DIAGNOSIS — R404 Transient alteration of awareness: Secondary | ICD-10-CM | POA: Diagnosis not present

## 2022-05-10 DIAGNOSIS — I10 Essential (primary) hypertension: Secondary | ICD-10-CM | POA: Insufficient documentation

## 2022-05-10 DIAGNOSIS — N390 Urinary tract infection, site not specified: Secondary | ICD-10-CM

## 2022-05-10 DIAGNOSIS — Y732 Prosthetic and other implants, materials and accessory gastroenterology and urology devices associated with adverse incidents: Secondary | ICD-10-CM | POA: Diagnosis not present

## 2022-05-10 DIAGNOSIS — T83511A Infection and inflammatory reaction due to indwelling urethral catheter, initial encounter: Secondary | ICD-10-CM | POA: Diagnosis not present

## 2022-05-10 DIAGNOSIS — F0390 Unspecified dementia without behavioral disturbance: Secondary | ICD-10-CM | POA: Insufficient documentation

## 2022-05-10 DIAGNOSIS — L304 Erythema intertrigo: Secondary | ICD-10-CM

## 2022-05-10 DIAGNOSIS — Z794 Long term (current) use of insulin: Secondary | ICD-10-CM | POA: Diagnosis not present

## 2022-05-10 MED ORDER — NYSTATIN 100000 UNIT/GM EX CREA: TOPICAL_CREAM | CUTANEOUS | 0 refills | Status: AC

## 2022-05-10 MED ORDER — FENTANYL 12 MCG/HR TD PT72
1.0000 | MEDICATED_PATCH | TRANSDERMAL | Status: DC
  Administered 2022-05-10: 1 via TRANSDERMAL
  Filled 2022-05-10: qty 1

## 2022-05-10 MED ORDER — FENTANYL 25 MCG/HR TD PT72: 1.0000 | MEDICATED_PATCH | TRANSDERMAL | Status: DC

## 2022-05-10 MED ORDER — NYSTATIN 100000 UNIT/GM EX CREA
TOPICAL_CREAM | Freq: Once | CUTANEOUS | Status: AC
  Filled 2022-05-10: qty 30

## 2022-05-10 NOTE — ED Notes (Signed)
RN called [facility] for patient transport

## 2022-05-10 NOTE — ED Provider Notes (Signed)
[facility] EMERGENCY DEPARTMENT AT [facility] Provider Note   CSN: [id number] Arrival date & time: 05/10/22  1549     History  Chief Complaint  Patient presents with   Catheter replacement    Francisco Leon is a 89 y.o. male.  Pt is a 89 yo male with pmhx significant for dementia.  Htn, hld, pvcs, dm and bph.  Pt has not been eating or drinking for 2 days.  He has what looks like milk coming out of his catheter.  Pt's wife realizes he is dying, but wants to make sure he is comfortable.  He is going to hospice on Monday (1/22).  She wants pain control for him that he does not have to swallow and wants [place] to change his catheter.  She does not want any blood work or IV abx or admission.  He was here on 1/16 and had a urine culture that grew out pseudomonas.  He was given a dose of gent in the ED.  Wife is aware that no oral medication will treat infection.  She just wants him home.         Home Medications Prior to Admission medications   Medication Sig Start Date End Date Taking? Authorizing Provider  nystatin cream (MYCOSTATIN) Apply to affected area 2 times daily 05/10/22  Yes Isla Pence, MD  atorvastatin (LIPITOR) 10 MG tablet Take 10 mg by mouth at bedtime. 08/05/21   [provider]  carboxymethylcellulose (REFRESH PLUS) 0.5 % SOLN 1 drop 3 (three) times daily as needed (dry eyes).    [provider]  escitalopram (LEXAPRO) 10 MG tablet Take 1 tablet (10 mg total) by mouth at bedtime. 02/25/22   Terrilee Croak, MD  finasteride (PROSCAR) 5 MG tablet Take 1 tablet (5 mg total) by mouth daily. For massive prostate and urinary bleeding. 02/15/22   Alexis Frock, MD  FREESTYLE LITE test strip 1 each daily. 08/07/21   [provider]  glucose blood (FREESTYLE LITE) test strip USE 1 STRIP TO TEST BLOOD SUGAR DAILY 07/27/15   [provider]  Guaifenesin (MUCINEX MAXIMUM STRENGTH) 1200 MG TB12 Take 1,200 mg by mouth daily.    [provider]  insulin aspart (NOVOLOG) 100 UNIT/ML injection Inject 0-6 Units into the skin 3 (three) times daily with meals. 02/25/22   Dahal, Marlowe Aschoff, MD  insulin aspart (NOVOLOG) 100 UNIT/ML injection Inject 0-5 Units into the skin at bedtime. Patient not taking: Reported on 05/02/2022 02/25/22   Terrilee Croak, MD  Lancets (FREESTYLE) lancets daily. 07/12/21   [provider]  levocetirizine (XYZAL) 5 MG tablet Take 1 tablet by mouth every evening. 10/01/15   [provider]  losartan (COZAAR) 50 MG tablet Take 50 mg by mouth at bedtime. 08/20/21   [provider]  melatonin 3 MG TABS tablet Take 1 tablet (3 mg total) by mouth at bedtime. 02/25/22   Terrilee Croak, MD  metFORMIN (GLUCOPHAGE) 500 MG tablet Take 500 mg by mouth daily with supper. 06/08/21   [provider]  polyethylene glycol (MIRALAX / GLYCOLAX) 17 g packet Take 17 g by mouth daily.    [provider]  QUEtiapine (SEROQUEL) 25 MG tablet Take 1 tablet (25 mg total) by mouth at bedtime. 02/25/22   Dahal, Marlowe Aschoff, MD  sodium chloride (MURO 128) 2 % ophthalmic solution Place 1 drop into both eyes at bedtime.    [provider]  tamsulosin (FLOMAX) 0.4 MG CAPS capsule 0.4 mg in the  morning and at bedtime. 08/15/15   [provider]  traZODone (DESYREL) 50 MG tablet Take 1 tablet (50 mg total) by mouth at bedtime as needed for sleep. 02/25/22   Terrilee Croak, MD      Allergies    Dutasteride, Lisinopril, and Sulfa antibiotics    Review of Systems   Review of Systems  Unable to perform ROS: Dementia    Physical Exam Updated Vital Signs BP 139/75 (BP Location: Left Arm)   Pulse 79   Temp 99.5 F (37.5 C) (Oral)   Resp (!) 23   Ht '5\' 6"'$  (1.676 m)   Wt 67 kg   SpO2 95%   BMI 23.84 kg/m  Physical Exam Vitals and nursing note reviewed.  Constitutional:      Appearance: He is ill-appearing.  HENT:     Head: Normocephalic and atraumatic.     Right Ear: External ear  normal.     Left Ear: External ear normal.     Nose: Nose normal.     Mouth/Throat:     Mouth: Mucous membranes are dry.  Eyes:     Extraocular Movements: Extraocular movements intact.     Pupils: Pupils are equal, round, and reactive to light.  Cardiovascular:     Rate and Rhythm: Normal rate and regular rhythm.     Pulses: Normal pulses.     Heart sounds: Normal heart sounds.  Pulmonary:     Effort: Pulmonary effort is normal.     Breath sounds: Normal breath sounds.  Abdominal:     General: Abdomen is flat. Bowel sounds are normal.     Palpations: Abdomen is soft.  Genitourinary:    Comments: Foley catheter in place.  Frank pus in catheter tubing and bag.  Candidal intertrigo noted.   Musculoskeletal:        General: No swelling.     Cervical back: Normal range of motion and neck supple.  Skin:    General: Skin is warm.     Capillary Refill: Capillary refill takes 2 to 3 seconds.  Neurological:     Mental Status: He is disoriented.     ED Results / Procedures / Treatments   Labs (all labs ordered are listed, but only abnormal results are displayed) Labs Reviewed - No data to display  EKG None  Radiology No results found.  Procedures Procedures    Medications Ordered in ED Medications  fentaNYL (DURAGESIC) 25 MCG/HR 1 patch (has no administration in time range)    ED Course/ Medical Decision Making/ A&P                             Medical Decision Making Risk Prescription drug management.   This patient presents to the ED for concern of pain, this involves an extensive number of treatment options, and is a complaint that carries with it a high risk of complications and morbidity.  The differential diagnosis includes uti, aki   Co morbidities that complicate the patient evaluation   dementia.  Htn, hld, pvcs, dm and bph   Additional history obtained:  Additional history obtained from epic chart review External records from outside source  obtained and reviewed including wife   Medicines ordered and prescription drug management:  I ordered medication including fentanyl patch  for pain  Reevaluation of the patient after these medicines showed that the patient stayed the same I have reviewed the patients home medicines and have made  adjustments as needed   Test Considered:  Labs/ua   Critical Interventions:  Pain control   Problem List / ED Course:  Pseudomonas uti:  wife does not want ua or blood to be done.  She does not want ivfs or iv abx.  She only wants his pain treated and his catheter to be changed.  We changed cath.  We gave him fentanyl td patch which will last until Monday when he will get morphine via hospice.  Wife knows he likely has very little time left.  She wants that time to be comfortable.   Reevaluation:  After the interventions noted above, I reevaluated the patient and found that they have :improved   Social Determinants of Health:  Lives at home   Dispostion:  After consideration of the diagnostic results and the patients response to treatment, I feel that the patent would benefit from discharge with outpatient f/u.          Final Clinical Impression(s) / ED Diagnoses Final diagnoses:  Urinary tract infection associated with indwelling urethral catheter, initial encounter ([place])  Intertrigo    Rx / DC Orders ED Discharge Orders          Ordered    nystatin cream (MYCOSTATIN)        05/10/22 1626              Isla Pence, MD 05/10/22 1636

## 2022-05-10 NOTE — Discharge Instructions (Addendum)
Stop the cipro.  It is not helping as the bacteria is resistant to it.  The fentanyl patch will be good for 3 days.

## 2022-05-10 NOTE — ED Triage Notes (Signed)
Patient brought in by EMS from home. Per EMS patient wife stated that patient catheter needs to be replaced and that's all. He will be going into hospice on Monday and he has been given ABX for UTI but has not started them. 180/68 70 97% RA 24 CBG: 194

## 2022-05-11 LAB — CULTURE, BLOOD (ROUTINE X 2)
Culture: NO GROWTH
Special Requests: ADEQUATE

## 2022-05-12 ENCOUNTER — Other Ambulatory Visit: Payer: Medicare Other

## 2022-05-12 DIAGNOSIS — I493 Ventricular premature depolarization: Secondary | ICD-10-CM | POA: Diagnosis not present

## 2022-05-12 DIAGNOSIS — E119 Type 2 diabetes mellitus without complications: Secondary | ICD-10-CM | POA: Diagnosis not present

## 2022-05-12 DIAGNOSIS — K219 Gastro-esophageal reflux disease without esophagitis: Secondary | ICD-10-CM | POA: Diagnosis not present

## 2022-05-12 DIAGNOSIS — I10 Essential (primary) hypertension: Secondary | ICD-10-CM | POA: Diagnosis not present

## 2022-05-12 DIAGNOSIS — E785 Hyperlipidemia, unspecified: Secondary | ICD-10-CM | POA: Diagnosis not present

## 2022-05-12 DIAGNOSIS — F01511 Vascular dementia, unspecified severity, with agitation: Secondary | ICD-10-CM | POA: Diagnosis not present

## 2022-05-12 DIAGNOSIS — N400 Enlarged prostate without lower urinary tract symptoms: Secondary | ICD-10-CM | POA: Diagnosis not present

## 2022-05-12 DIAGNOSIS — Z87440 Personal history of urinary (tract) infections: Secondary | ICD-10-CM | POA: Diagnosis not present

## 2022-05-12 DIAGNOSIS — F0152 Vascular dementia, unspecified severity, with psychotic disturbance: Secondary | ICD-10-CM | POA: Diagnosis not present

## 2022-05-12 DIAGNOSIS — E1169 Type 2 diabetes mellitus with other specified complication: Secondary | ICD-10-CM | POA: Diagnosis not present

## 2022-05-12 DIAGNOSIS — E7800 Pure hypercholesterolemia, unspecified: Secondary | ICD-10-CM | POA: Diagnosis not present

## 2022-05-12 DIAGNOSIS — I69818 Other symptoms and signs involving cognitive functions following other cerebrovascular disease: Secondary | ICD-10-CM | POA: Diagnosis not present

## 2022-05-13 DIAGNOSIS — F01511 Vascular dementia, unspecified severity, with agitation: Secondary | ICD-10-CM | POA: Diagnosis not present

## 2022-05-13 DIAGNOSIS — F0152 Vascular dementia, unspecified severity, with psychotic disturbance: Secondary | ICD-10-CM | POA: Diagnosis not present

## 2022-05-13 DIAGNOSIS — I10 Essential (primary) hypertension: Secondary | ICD-10-CM | POA: Diagnosis not present

## 2022-05-13 DIAGNOSIS — N400 Enlarged prostate without lower urinary tract symptoms: Secondary | ICD-10-CM | POA: Diagnosis not present

## 2022-05-13 DIAGNOSIS — I69818 Other symptoms and signs involving cognitive functions following other cerebrovascular disease: Secondary | ICD-10-CM | POA: Diagnosis not present

## 2022-05-13 DIAGNOSIS — I493 Ventricular premature depolarization: Secondary | ICD-10-CM | POA: Diagnosis not present

## 2022-05-14 DIAGNOSIS — I69818 Other symptoms and signs involving cognitive functions following other cerebrovascular disease: Secondary | ICD-10-CM | POA: Diagnosis not present

## 2022-05-14 DIAGNOSIS — F0152 Vascular dementia, unspecified severity, with psychotic disturbance: Secondary | ICD-10-CM | POA: Diagnosis not present

## 2022-05-14 DIAGNOSIS — I10 Essential (primary) hypertension: Secondary | ICD-10-CM | POA: Diagnosis not present

## 2022-05-14 DIAGNOSIS — F01511 Vascular dementia, unspecified severity, with agitation: Secondary | ICD-10-CM | POA: Diagnosis not present

## 2022-05-14 DIAGNOSIS — N400 Enlarged prostate without lower urinary tract symptoms: Secondary | ICD-10-CM | POA: Diagnosis not present

## 2022-05-14 DIAGNOSIS — I493 Ventricular premature depolarization: Secondary | ICD-10-CM | POA: Diagnosis not present

## 2022-05-15 DIAGNOSIS — I493 Ventricular premature depolarization: Secondary | ICD-10-CM | POA: Diagnosis not present

## 2022-05-15 DIAGNOSIS — I69818 Other symptoms and signs involving cognitive functions following other cerebrovascular disease: Secondary | ICD-10-CM | POA: Diagnosis not present

## 2022-05-15 DIAGNOSIS — F01511 Vascular dementia, unspecified severity, with agitation: Secondary | ICD-10-CM | POA: Diagnosis not present

## 2022-05-15 DIAGNOSIS — N400 Enlarged prostate without lower urinary tract symptoms: Secondary | ICD-10-CM | POA: Diagnosis not present

## 2022-05-15 DIAGNOSIS — F0152 Vascular dementia, unspecified severity, with psychotic disturbance: Secondary | ICD-10-CM | POA: Diagnosis not present

## 2022-05-15 DIAGNOSIS — I10 Essential (primary) hypertension: Secondary | ICD-10-CM | POA: Diagnosis not present

## 2022-05-16 DIAGNOSIS — I493 Ventricular premature depolarization: Secondary | ICD-10-CM | POA: Diagnosis not present

## 2022-05-16 DIAGNOSIS — F01511 Vascular dementia, unspecified severity, with agitation: Secondary | ICD-10-CM | POA: Diagnosis not present

## 2022-05-16 DIAGNOSIS — I69818 Other symptoms and signs involving cognitive functions following other cerebrovascular disease: Secondary | ICD-10-CM | POA: Diagnosis not present

## 2022-05-16 DIAGNOSIS — F0152 Vascular dementia, unspecified severity, with psychotic disturbance: Secondary | ICD-10-CM | POA: Diagnosis not present

## 2022-05-16 DIAGNOSIS — I10 Essential (primary) hypertension: Secondary | ICD-10-CM | POA: Diagnosis not present

## 2022-05-16 DIAGNOSIS — N400 Enlarged prostate without lower urinary tract symptoms: Secondary | ICD-10-CM | POA: Diagnosis not present

## 2022-05-17 DIAGNOSIS — F01511 Vascular dementia, unspecified severity, with agitation: Secondary | ICD-10-CM | POA: Diagnosis not present

## 2022-05-17 DIAGNOSIS — N400 Enlarged prostate without lower urinary tract symptoms: Secondary | ICD-10-CM | POA: Diagnosis not present

## 2022-05-17 DIAGNOSIS — I10 Essential (primary) hypertension: Secondary | ICD-10-CM | POA: Diagnosis not present

## 2022-05-17 DIAGNOSIS — F0152 Vascular dementia, unspecified severity, with psychotic disturbance: Secondary | ICD-10-CM | POA: Diagnosis not present

## 2022-05-17 DIAGNOSIS — I493 Ventricular premature depolarization: Secondary | ICD-10-CM | POA: Diagnosis not present

## 2022-05-17 DIAGNOSIS — I69818 Other symptoms and signs involving cognitive functions following other cerebrovascular disease: Secondary | ICD-10-CM | POA: Diagnosis not present

## 2022-05-18 DIAGNOSIS — I10 Essential (primary) hypertension: Secondary | ICD-10-CM | POA: Diagnosis not present

## 2022-05-18 DIAGNOSIS — F0152 Vascular dementia, unspecified severity, with psychotic disturbance: Secondary | ICD-10-CM | POA: Diagnosis not present

## 2022-05-18 DIAGNOSIS — I69818 Other symptoms and signs involving cognitive functions following other cerebrovascular disease: Secondary | ICD-10-CM | POA: Diagnosis not present

## 2022-05-18 DIAGNOSIS — N400 Enlarged prostate without lower urinary tract symptoms: Secondary | ICD-10-CM | POA: Diagnosis not present

## 2022-05-18 DIAGNOSIS — F01511 Vascular dementia, unspecified severity, with agitation: Secondary | ICD-10-CM | POA: Diagnosis not present

## 2022-05-18 DIAGNOSIS — I493 Ventricular premature depolarization: Secondary | ICD-10-CM | POA: Diagnosis not present

## 2022-05-19 DIAGNOSIS — F01511 Vascular dementia, unspecified severity, with agitation: Secondary | ICD-10-CM | POA: Diagnosis not present

## 2022-05-19 DIAGNOSIS — F0152 Vascular dementia, unspecified severity, with psychotic disturbance: Secondary | ICD-10-CM | POA: Diagnosis not present

## 2022-05-19 DIAGNOSIS — I493 Ventricular premature depolarization: Secondary | ICD-10-CM | POA: Diagnosis not present

## 2022-05-19 DIAGNOSIS — I10 Essential (primary) hypertension: Secondary | ICD-10-CM | POA: Diagnosis not present

## 2022-05-19 DIAGNOSIS — N400 Enlarged prostate without lower urinary tract symptoms: Secondary | ICD-10-CM | POA: Diagnosis not present

## 2022-05-19 DIAGNOSIS — I69818 Other symptoms and signs involving cognitive functions following other cerebrovascular disease: Secondary | ICD-10-CM | POA: Diagnosis not present

## 2022-05-22 DEATH — deceased

## 2022-05-29 ENCOUNTER — Telehealth: Payer: Self-pay

## 2022-05-29 NOTE — Telephone Encounter (Signed)
Wife called in to notify us that patient has passed away.

## 2022-07-10 ENCOUNTER — Ambulatory Visit: Payer: Medicare Other | Admitting: Physician Assistant

## 2022-10-31 ENCOUNTER — Ambulatory Visit: Payer: Medicare Other | Admitting: Physician Assistant
